# Patient Record
Sex: Female | Born: 1960
Health system: Southern US, Community
[De-identification: ages and names within clinical notes are randomized; demographics above are authoritative.]

## PROBLEM LIST (undated history)

## (undated) DIAGNOSIS — F32A Depression, unspecified: Secondary | ICD-10-CM

## (undated) DIAGNOSIS — D649 Anemia, unspecified: Secondary | ICD-10-CM

## (undated) DIAGNOSIS — E079 Disorder of thyroid, unspecified: Secondary | ICD-10-CM

## (undated) DIAGNOSIS — E039 Hypothyroidism, unspecified: Secondary | ICD-10-CM

## (undated) DIAGNOSIS — F419 Anxiety disorder, unspecified: Secondary | ICD-10-CM

## (undated) DIAGNOSIS — F329 Major depressive disorder, single episode, unspecified: Secondary | ICD-10-CM

## (undated) DIAGNOSIS — Z460 Encounter for fitting and adjustment of spectacles and contact lenses: Secondary | ICD-10-CM

## (undated) HISTORY — PX: BREAST EXCISIONAL BIOPSY: SUR124

## (undated) HISTORY — DX: Disorder of thyroid, unspecified: E07.9

---

## 1990-03-25 HISTORY — PX: BREAST SURGERY: SHX581

## 1997-11-16 ENCOUNTER — Other Ambulatory Visit: Admission: RE | Admit: 1997-11-16 | Discharge: 1997-11-16 | Payer: Self-pay | Admitting: Obstetrics and Gynecology

## 1998-05-29 ENCOUNTER — Ambulatory Visit (HOSPITAL_COMMUNITY): Admission: RE | Admit: 1998-05-29 | Discharge: 1998-05-29 | Payer: Self-pay | Admitting: *Deleted

## 1998-05-29 ENCOUNTER — Encounter: Payer: Self-pay | Admitting: *Deleted

## 1999-07-27 ENCOUNTER — Other Ambulatory Visit: Admission: RE | Admit: 1999-07-27 | Discharge: 1999-07-27 | Payer: Self-pay | Admitting: *Deleted

## 1999-09-07 ENCOUNTER — Encounter: Payer: Self-pay | Admitting: *Deleted

## 1999-09-07 ENCOUNTER — Encounter: Admission: RE | Admit: 1999-09-07 | Discharge: 1999-09-07 | Payer: Self-pay | Admitting: *Deleted

## 2000-10-20 ENCOUNTER — Encounter: Payer: Self-pay | Admitting: *Deleted

## 2000-10-20 ENCOUNTER — Ambulatory Visit (HOSPITAL_COMMUNITY): Admission: RE | Admit: 2000-10-20 | Discharge: 2000-10-20 | Payer: Self-pay | Admitting: *Deleted

## 2000-10-27 ENCOUNTER — Other Ambulatory Visit: Admission: RE | Admit: 2000-10-27 | Discharge: 2000-10-27 | Payer: Self-pay | Admitting: *Deleted

## 2001-12-14 ENCOUNTER — Other Ambulatory Visit: Admission: RE | Admit: 2001-12-14 | Discharge: 2001-12-14 | Payer: Self-pay | Admitting: Obstetrics and Gynecology

## 2002-05-24 ENCOUNTER — Ambulatory Visit (HOSPITAL_COMMUNITY): Admission: RE | Admit: 2002-05-24 | Discharge: 2002-05-24 | Payer: Self-pay | Admitting: Obstetrics and Gynecology

## 2002-05-24 ENCOUNTER — Encounter: Payer: Self-pay | Admitting: Obstetrics and Gynecology

## 2003-05-16 ENCOUNTER — Other Ambulatory Visit: Admission: RE | Admit: 2003-05-16 | Discharge: 2003-05-16 | Payer: Self-pay | Admitting: Obstetrics and Gynecology

## 2003-08-08 ENCOUNTER — Ambulatory Visit (HOSPITAL_COMMUNITY): Admission: RE | Admit: 2003-08-08 | Discharge: 2003-08-08 | Payer: Self-pay | Admitting: Obstetrics and Gynecology

## 2003-09-17 ENCOUNTER — Ambulatory Visit: Admission: RE | Admit: 2003-09-17 | Discharge: 2003-09-17 | Payer: Self-pay | Admitting: Internal Medicine

## 2004-06-22 ENCOUNTER — Other Ambulatory Visit: Admission: RE | Admit: 2004-06-22 | Discharge: 2004-06-22 | Payer: Self-pay | Admitting: Obstetrics and Gynecology

## 2005-03-22 ENCOUNTER — Ambulatory Visit (HOSPITAL_COMMUNITY): Admission: RE | Admit: 2005-03-22 | Discharge: 2005-03-22 | Payer: Self-pay | Admitting: Obstetrics and Gynecology

## 2005-11-08 ENCOUNTER — Encounter: Admission: RE | Admit: 2005-11-08 | Discharge: 2005-11-08 | Payer: Self-pay | Admitting: Internal Medicine

## 2005-11-29 ENCOUNTER — Encounter: Admission: RE | Admit: 2005-11-29 | Discharge: 2005-11-29 | Payer: Self-pay | Admitting: Internal Medicine

## 2006-01-10 ENCOUNTER — Encounter: Admission: RE | Admit: 2006-01-10 | Discharge: 2006-01-10 | Payer: Self-pay | Admitting: Internal Medicine

## 2006-07-04 ENCOUNTER — Ambulatory Visit (HOSPITAL_COMMUNITY): Admission: RE | Admit: 2006-07-04 | Discharge: 2006-07-04 | Payer: Self-pay | Admitting: Obstetrics and Gynecology

## 2006-07-04 IMAGING — MG MM DIGITAL SCREENING BILAT
6 series · 6 of 6 positions shown · non-contrast
Comparison: none

DG SCREEN MAMMOGRAM BILATERAL
Bilateral CC and MLO view(s) were taken.

DIGITAL SCREENING MAMMOGRAM WITH CAD:
There is a  dense fibroglandular pattern.  No masses or malignant type calcifications are 
identified.  Compared with prior studies.

[R CC]
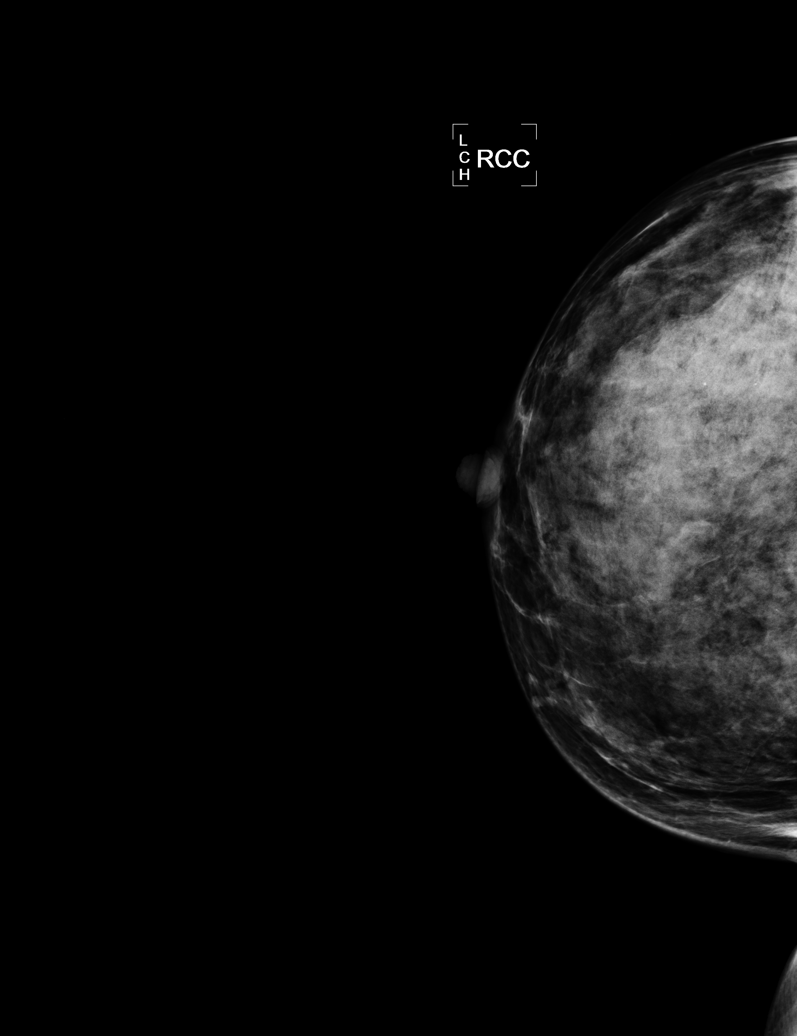

[R MLO (1 of 2)]
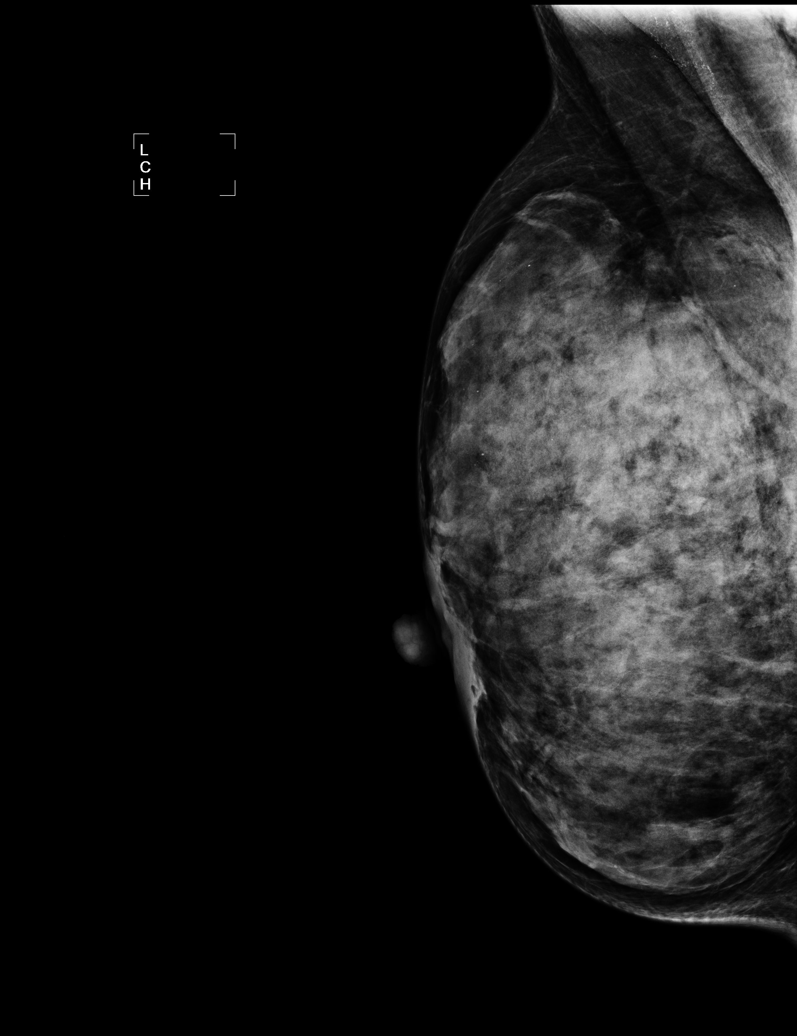

[L CC (1 of 2)]
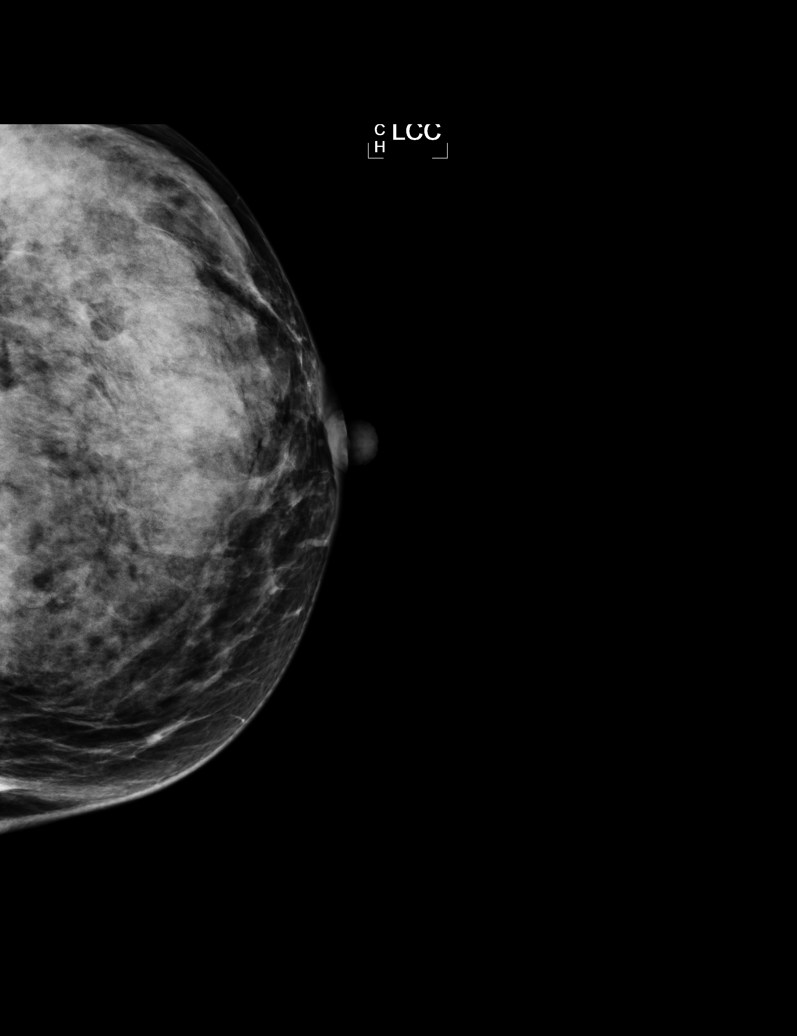

[L MLO]
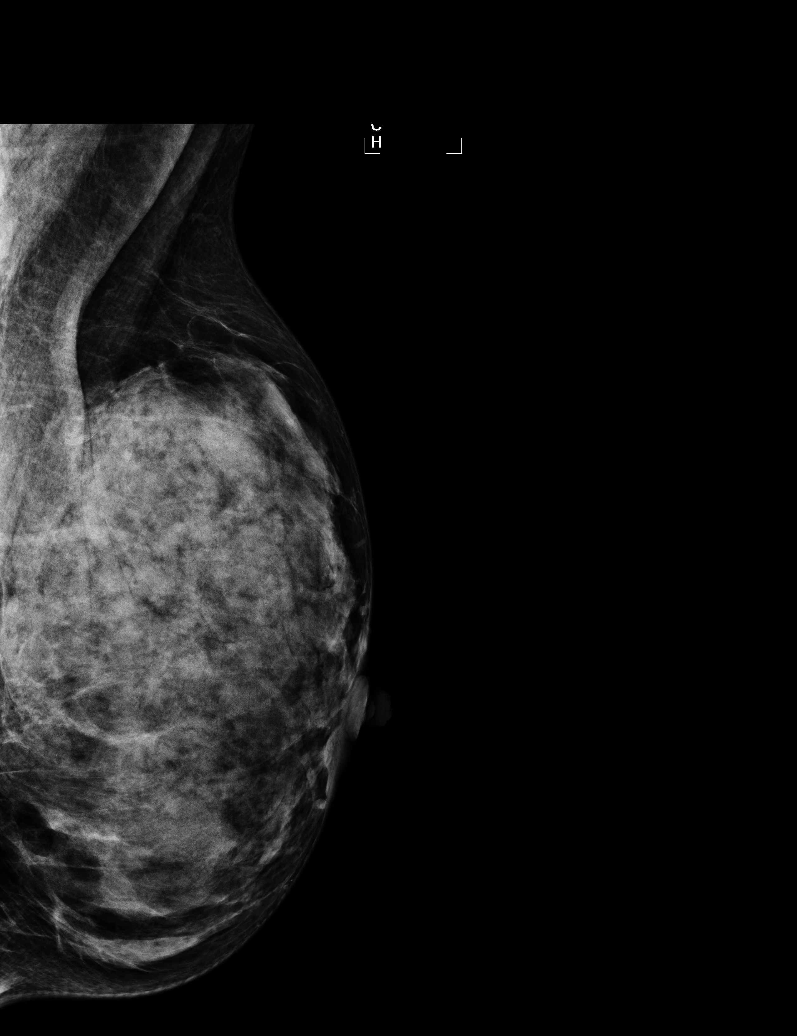

[R MLO (2 of 2)]
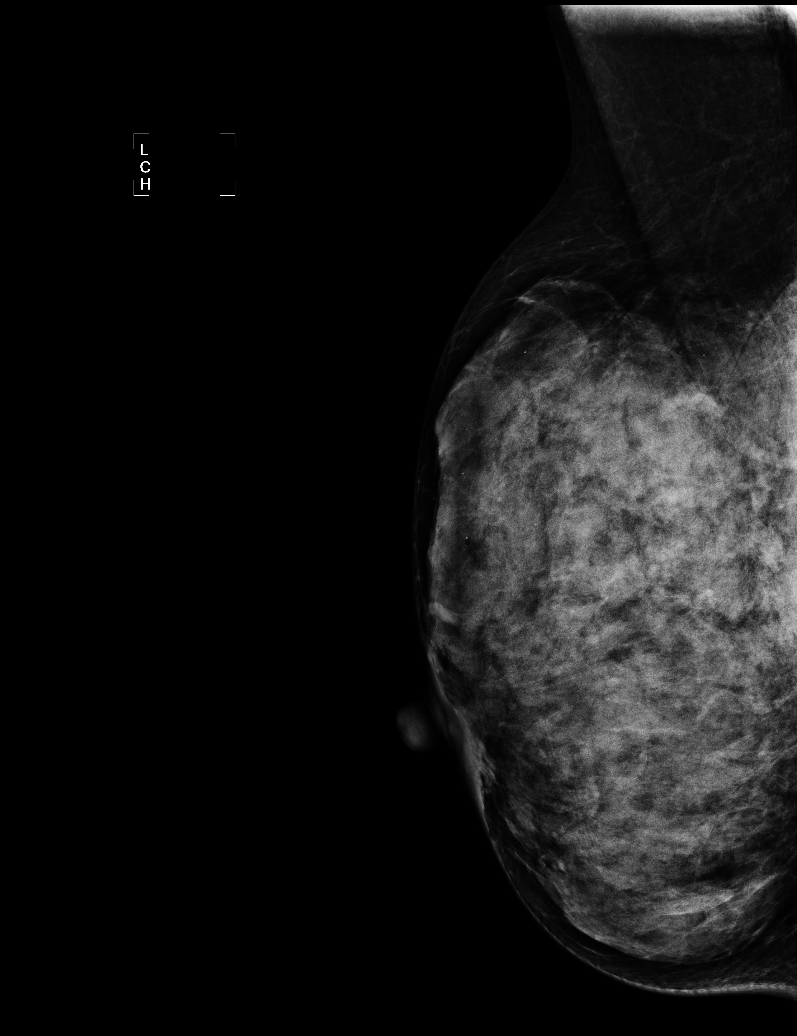

[L CC (2 of 2)]
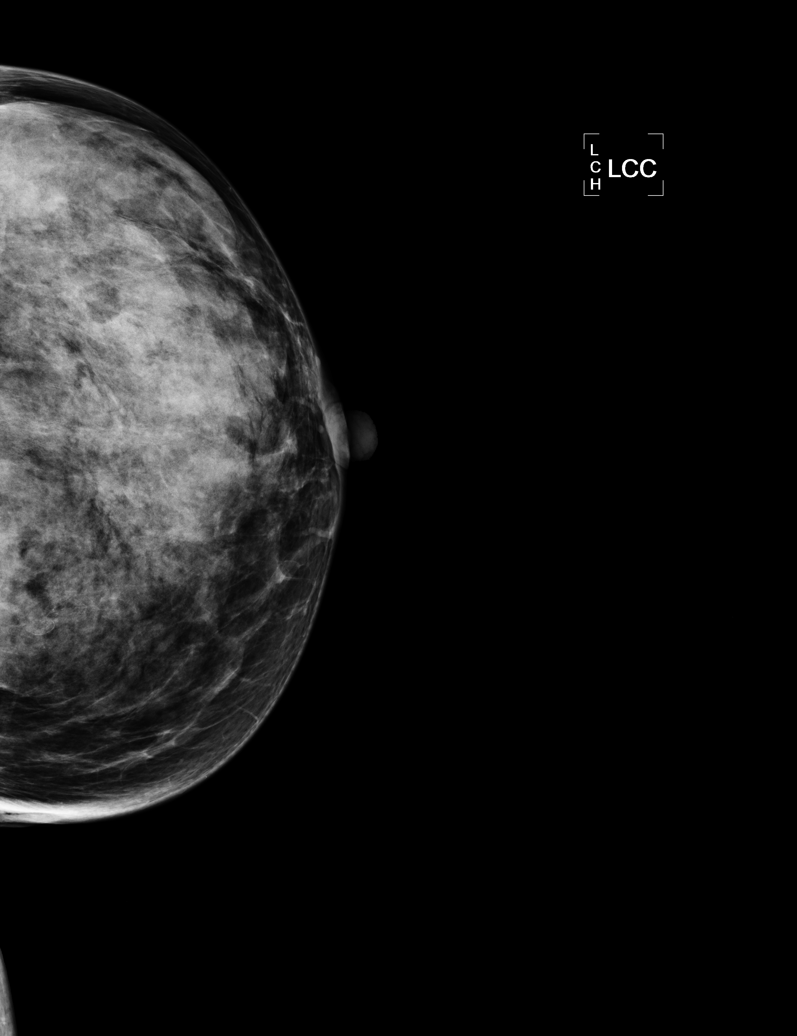

[6 of 6 positions shown; findings below may reference images not displayed]

IMPRESSION: No specific mammographic evidence of malignancy.  Next screening mammogram is recommended in one 
year.

ASSESSMENT: Negative - BI-RADS 1

Screening mammogram in 1 year.
ANALYZED BY COMPUTER AIDED DETECTION. , THIS PROCEDURE WAS A DIGITAL MAMMOGRAM.

## 2006-07-16 ENCOUNTER — Encounter: Admission: RE | Admit: 2006-07-16 | Discharge: 2006-07-16 | Payer: Self-pay | Admitting: Internal Medicine

## 2006-09-05 ENCOUNTER — Encounter: Admission: RE | Admit: 2006-09-05 | Discharge: 2006-09-05 | Payer: Self-pay | Admitting: Internal Medicine

## 2007-03-09 ENCOUNTER — Encounter: Admission: RE | Admit: 2007-03-09 | Discharge: 2007-03-09 | Payer: Self-pay | Admitting: Neurosurgery

## 2007-03-09 IMAGING — CT CT L SPINE W/ CM
4 of 7 series · 15 of 33 positions shown, 17 images · IV contrast (agent unspecified)
Comparison: none

CLINICAL DATA: Low back and right lower extremity pain. No previous lumbar
surgery.

LUMBAR DISCOGRAM 3 LEVEL: 
CT LUMBAR SPINE WITH INTRADISCAL CONTRAST:
CT MULTIPLANAR RECONSTRUCTION:
TECHNIQUE: Lumbar region prepped with Betadine, draped in usual sterile fashion,
and infiltrated locally with 1% lidocaine. Intravenous   Versed   administered
as anxiolytic   during continuous cardiorespiratory monitoring by the radiology
RN. Curved 22 gauge CHENG LEONG advanced into the L3-L4, L4-L5, and L5-S1
interspaces using a left  posterolateral approach. The levels were sequentially
injected with water-soluble contrast during continuous fluoroscopic and pressure
monitoring. The needles were removed and the patient transferred to CT for
helical scanning through these levels. Coronal and sagittal reconstructions were
generated from the axial scan.

[Series 2: l spine · axial · 0.27mm/px · z∈[-208,-120]mm · 4 of 59 slices shown, 5 images]
[im 12/59  soft-tissue]
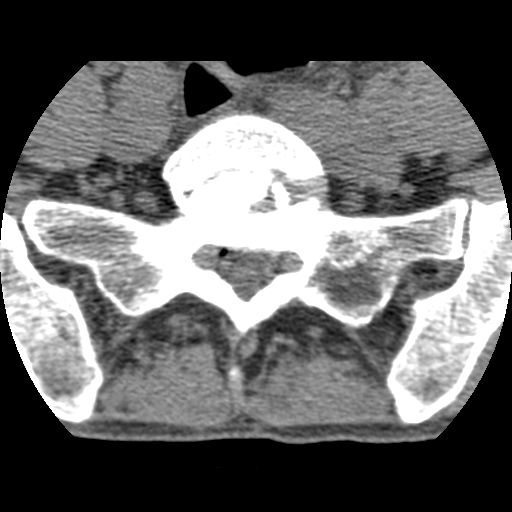
[im 12/59  bone]
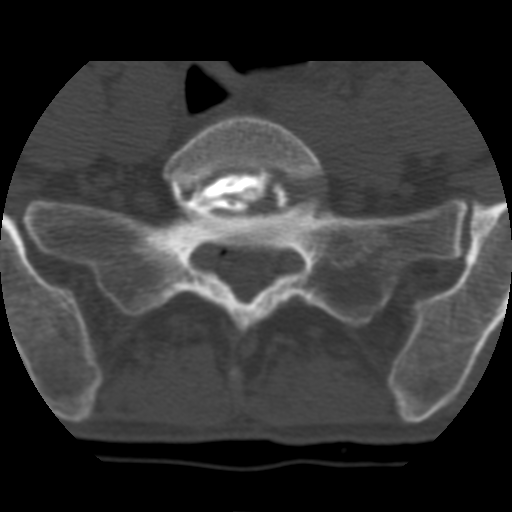
[im 24/59  bone]
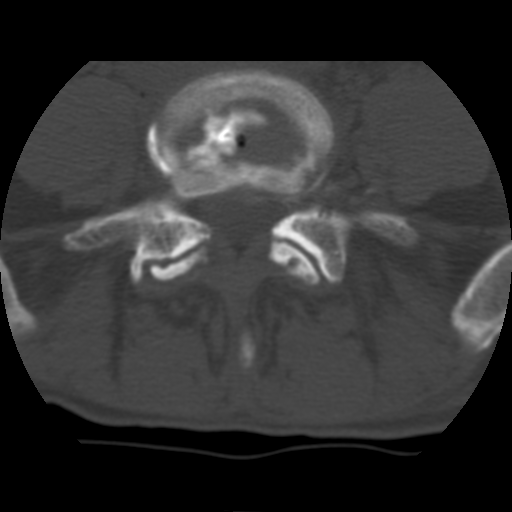
[im 35/59  bone]
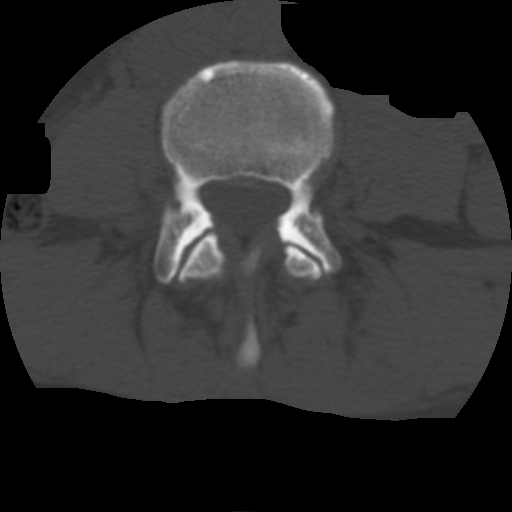
[im 47/59  bone]
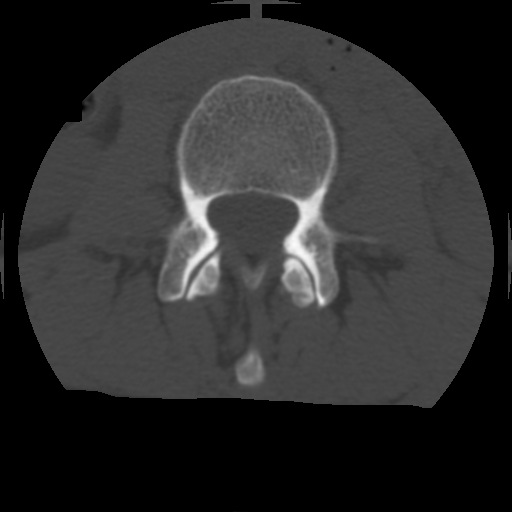

[Series 3: bone windows · axial · 0.27mm/px · z∈[-198,-128]mm · 3 of 58 slices shown]
[im 15/58  bone]
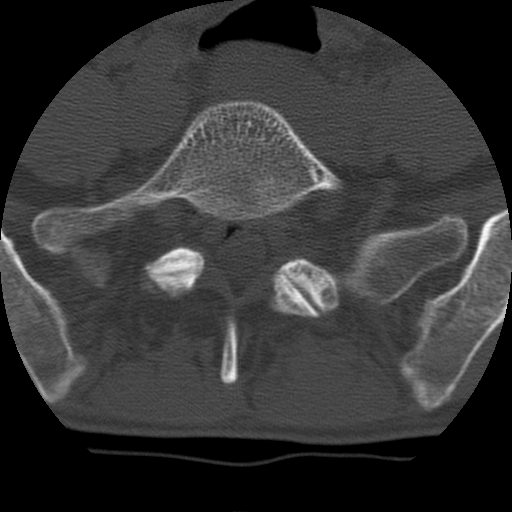
[im 29/58  bone]
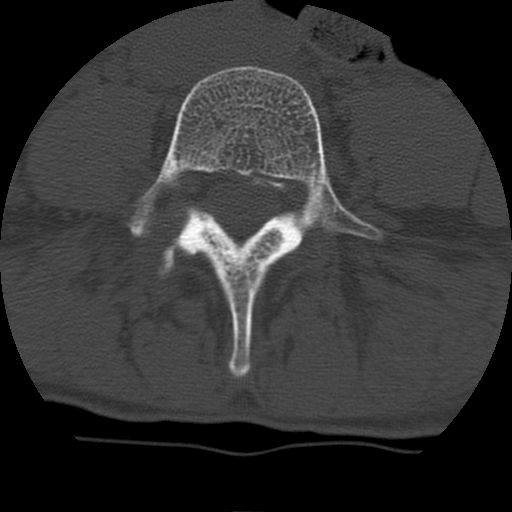
[im 43/58  bone]
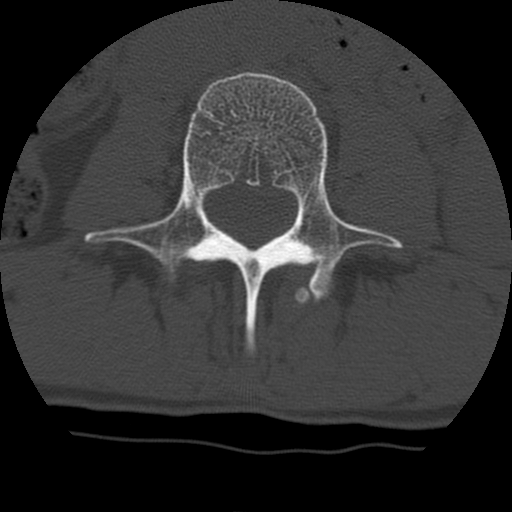

[Series 400: cor l-spine · coronal · 0.29mm/px · 3 of 40 slices shown]
[im 8/40  bone]
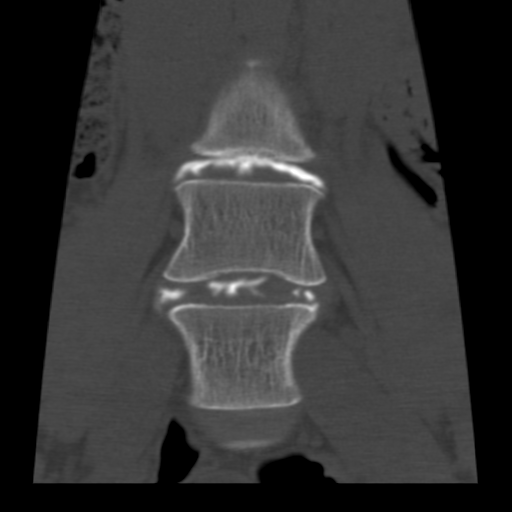
[im 16/40  bone]
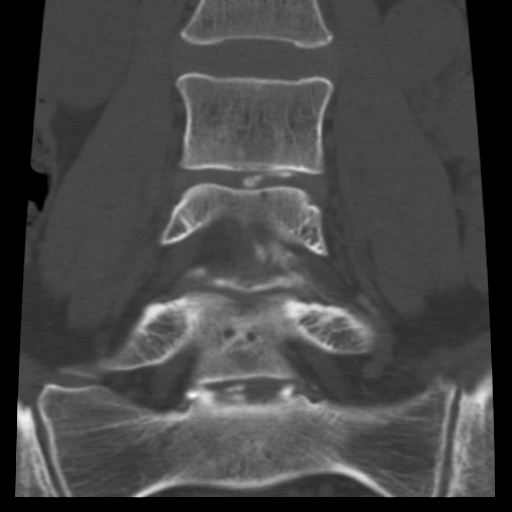
[im 24/40  bone]
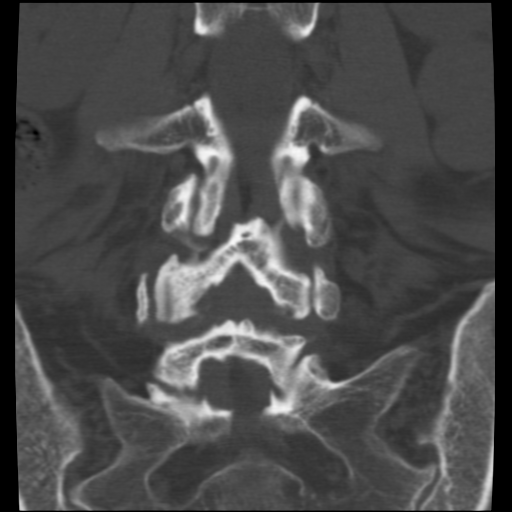

[Series 401: sag l-spine · sagittal · 0.29mm/px · 5 of 40 slices shown, 6 images]
[im 14/40  bone]
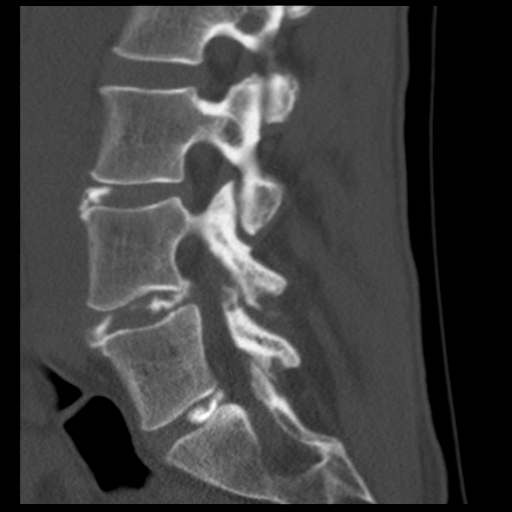
[im 17/40  bone]
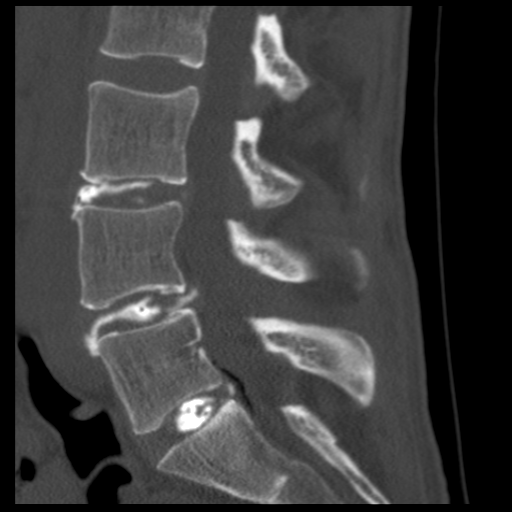
[im 20/40  soft-tissue]
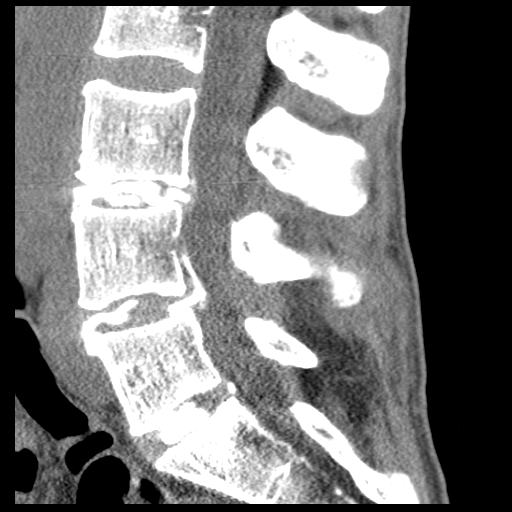
[im 20/40  bone]
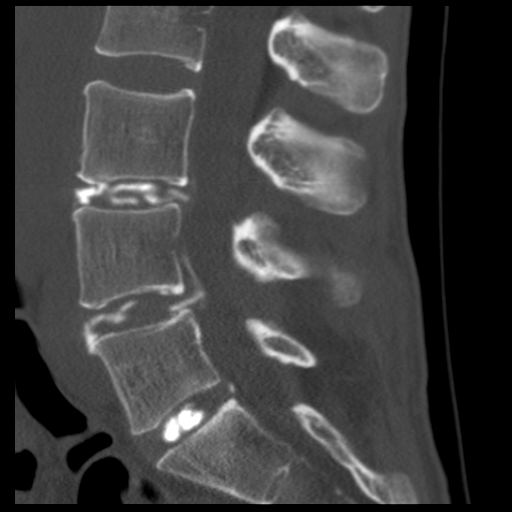
[im 23/40  bone]
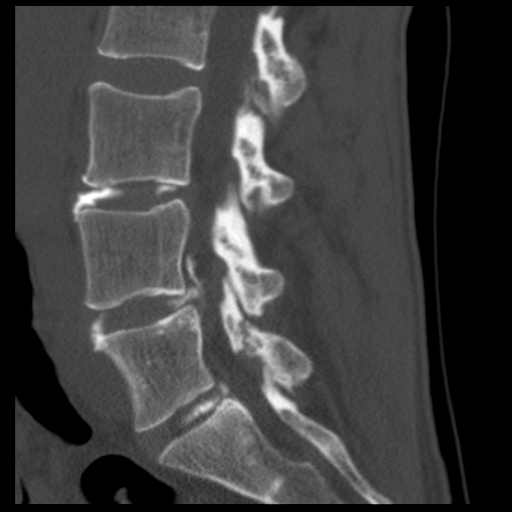
[im 27/40  bone]
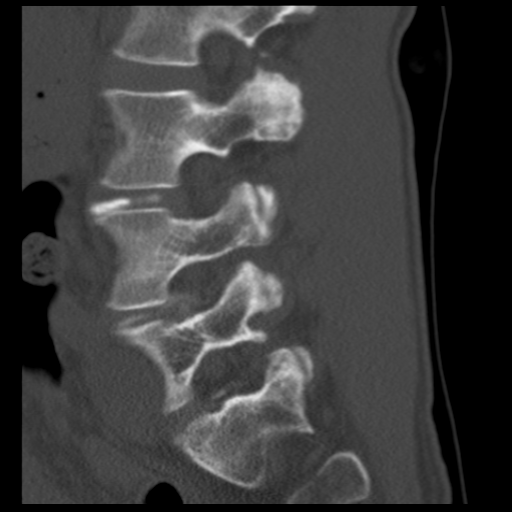

[15 of 33 positions shown; findings below may reference images not displayed]

FINDINGS: L3-L4: Opening pressures 0 PSI, rising to only 5 PSI after 2 ml contrast.
Patient described transient pain across the lumbar spine, concordant to a
portion of her usual pain, although this pain subsided relatively quickly and
she was not able to assign a value on the visual analogue pain scale.
Discography showed early anterior and posterior annular defects with posterior
fairly extensive extra annular extension of contrast. CT discography shows a
posterior midline annular radial tear with extension of contrast into an
associated bulge. There is also fairly extensive anterior annular degeneration.

L4-L5: Opening pressure 10 PSI, without significant increase in  pressure after
1 mL of contrast, at which point patient describes left lumbar pain, more
cephalad than her usual symptoms, rated [DATE] on the pain scale.
Discography showed posterior annular defect and anterior annular defects with
posterior extra annular extension of contrast. CT discography shows a broad
right posterolateral annular defect with contrast extending into a moderate
diffuse posterior disc protrusion with right and left foraminal components.
There is also fairly advanced anterior annular degeneration with some extension
of the contrast through the annulus extending anterior to the anterior plate of
L5.

L5-S1: Opening pressure 25 PSI, rising to 90 PSI after 3 mL contrast with no
pain response. Discography showed a small extra annular extension of contrast
although most of the contrast remained centrally in the nucleus. CT discography
demonstrates a right extraforaminal annular defect with mild circumferential
extra annular extension of contrast associated with a mild disc bulge. There is
a small amount of gas in the right parasagittal epidural space at the level of
interspace which may represent gas expelled from the interspace during contrast
injection.
IMPRESSION: 1. Abnormal discography L3-L4 with concordant pain response.
2. Abnormal discography L4-L5 with discordant pain response.
3. Abnormal discography L5-S1 with no significant pain response.

## 2007-03-09 IMAGING — RF DG DISKOGRAPHY LUMBAR S+I
8 series · 8 of 8 positions shown · IV contrast (agent unspecified)
Comparison: none

CLINICAL DATA: Low back and right lower extremity pain. No previous lumbar
surgery.

LUMBAR DISCOGRAM 3 LEVEL: 
CT LUMBAR SPINE WITH INTRADISCAL CONTRAST:
CT MULTIPLANAR RECONSTRUCTION:
TECHNIQUE: Lumbar region prepped with Betadine, draped in usual sterile fashion,
and infiltrated locally with 1% lidocaine. Intravenous   Versed   administered
as anxiolytic   during continuous cardiorespiratory monitoring by the radiology
RN. Curved 22 gauge CHENG LEONG advanced into the L3-L4, L4-L5, and L5-S1
interspaces using a left  posterolateral approach. The levels were sequentially
injected with water-soluble contrast during continuous fluoroscopic and pressure
monitoring. The needles were removed and the patient transferred to CT for
helical scanning through these levels. Coronal and sagittal reconstructions were
generated from the axial scan.

[Series 1: (hospital) · 1 of 1 slices shown]
[im 1/1]
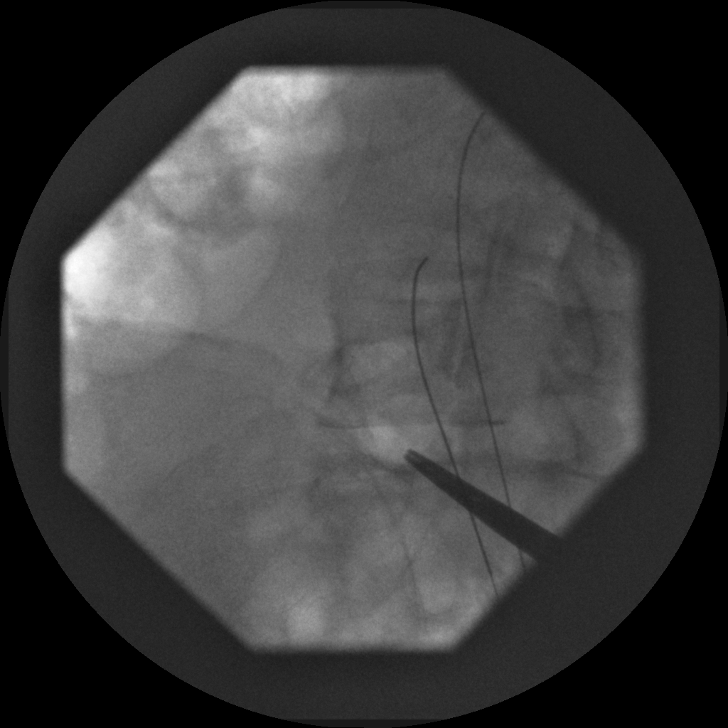

[Series 2: discogram · 1 of 1 slices shown (1 of 7)]
[im 1/1]
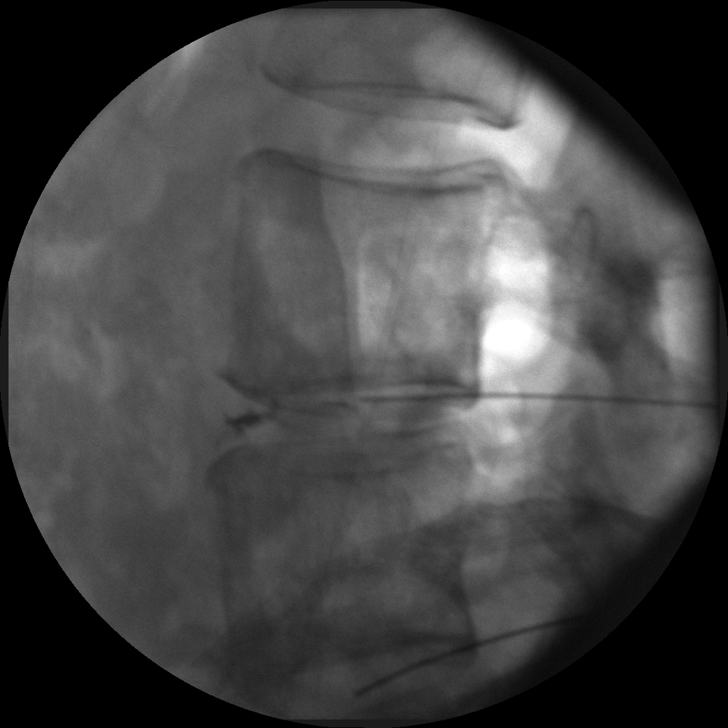

[Series 3: discogram · 1 of 1 slices shown (2 of 7)]
[im 1/1]
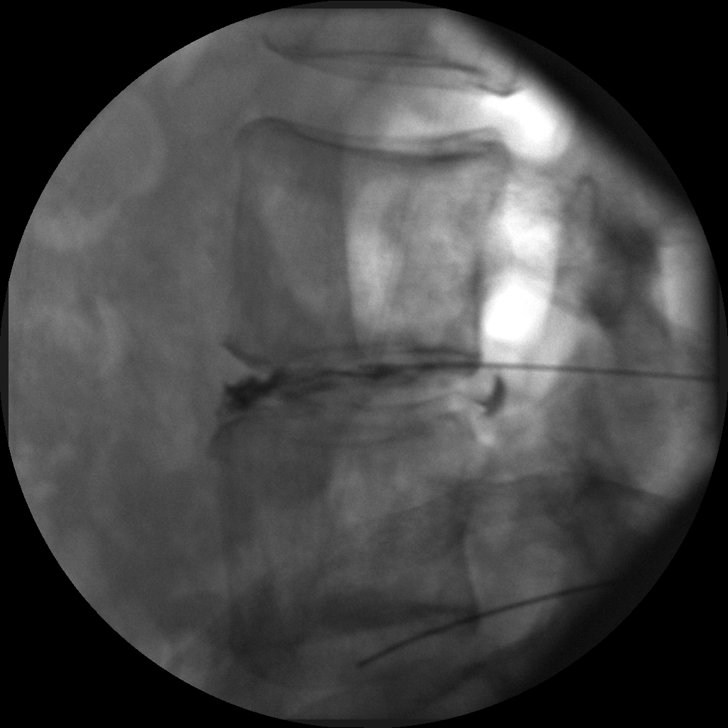

[Series 4: discogram · 1 of 1 slices shown (3 of 7)]
[im 1/1]
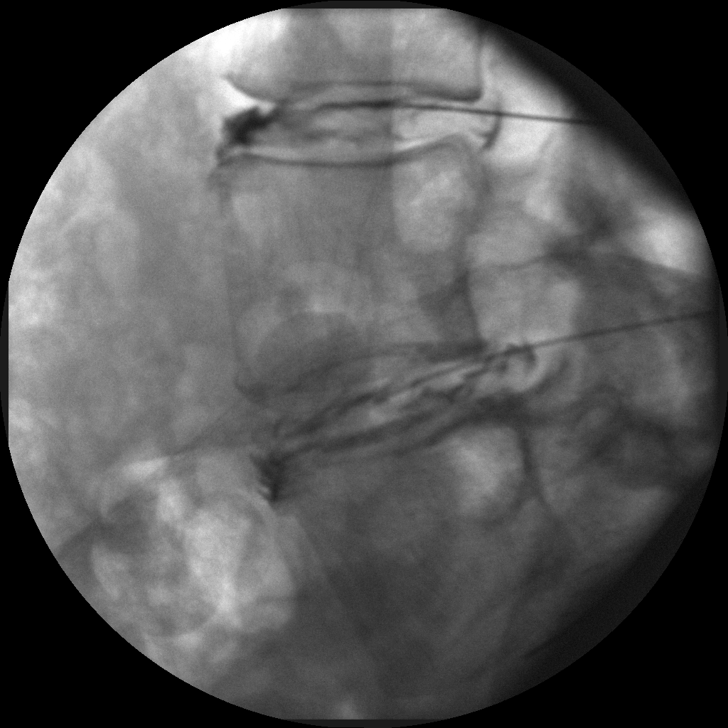

[Series 5: discogram · 1 of 1 slices shown (4 of 7)]
[im 1/1]
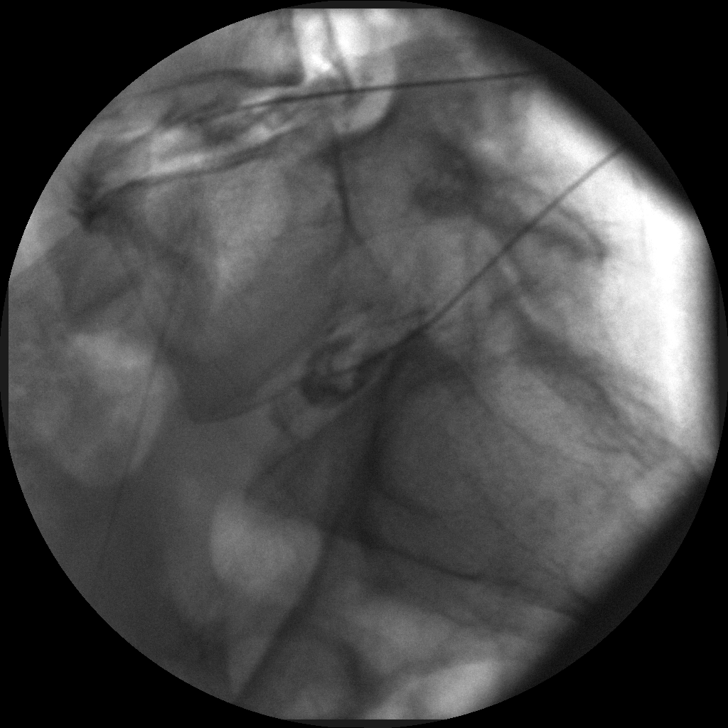

[Series 6: discogram · 1 of 1 slices shown (5 of 7)]
[im 1/1]
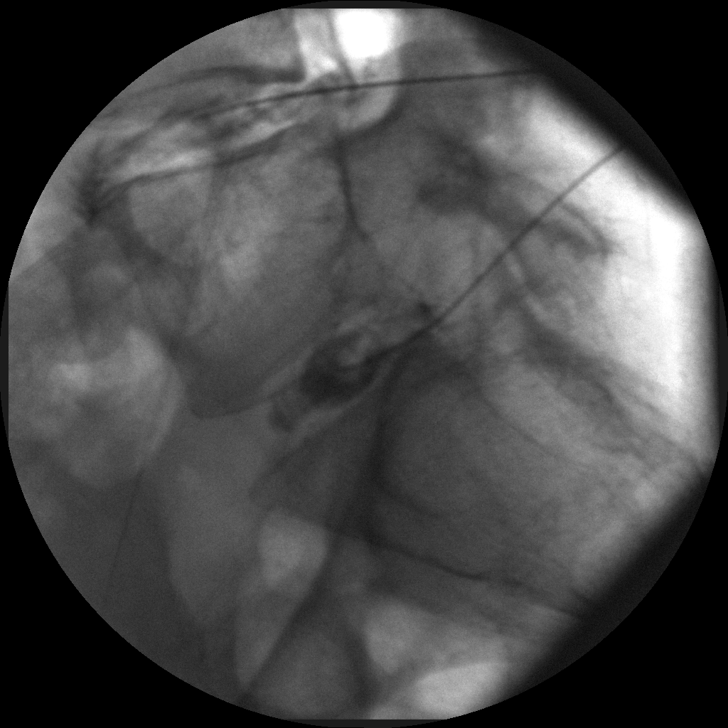

[Series 7: discogram · 1 of 1 slices shown (6 of 7)]
[im 1/1]
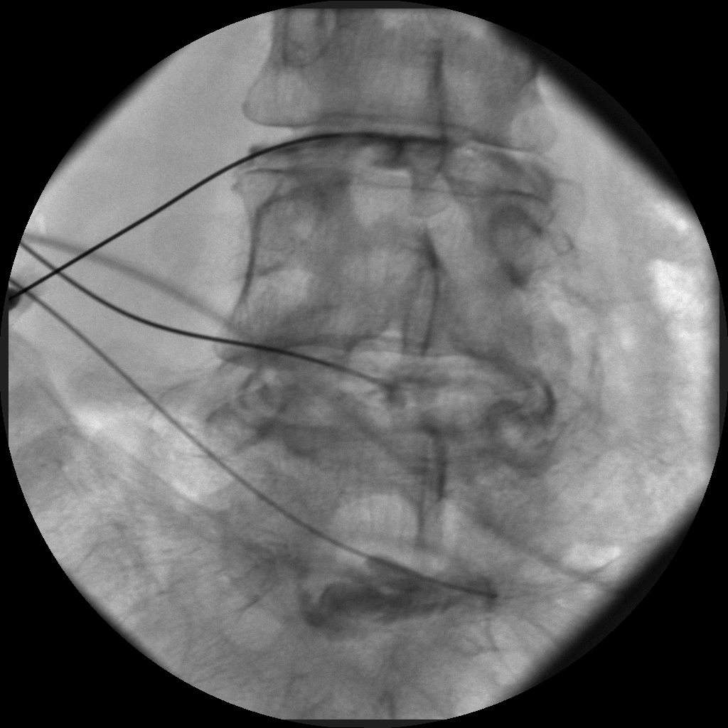

[Series 8: discogram · 1 of 1 slices shown (7 of 7)]
[im 1/1]
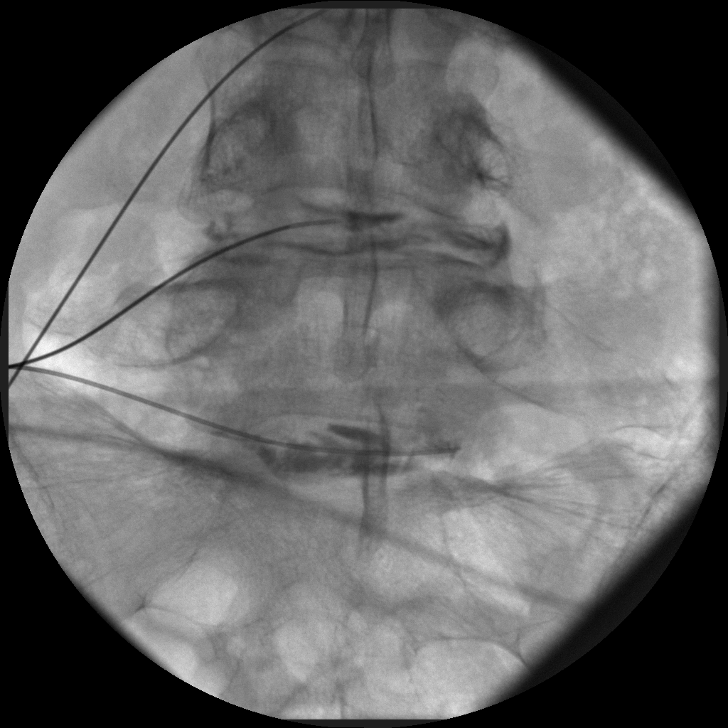

[8 of 8 positions shown; findings below may reference images not displayed]

FINDINGS: L3-L4: Opening pressures 0 PSI, rising to only 5 PSI after 2 ml contrast.
Patient described transient pain across the lumbar spine, concordant to a
portion of her usual pain, although this pain subsided relatively quickly and
she was not able to assign a value on the visual analogue pain scale.
Discography showed early anterior and posterior annular defects with posterior
fairly extensive extra annular extension of contrast. CT discography shows a
posterior midline annular radial tear with extension of contrast into an
associated bulge. There is also fairly extensive anterior annular degeneration.

L4-L5: Opening pressure 10 PSI, without significant increase in  pressure after
1 mL of contrast, at which point patient describes left lumbar pain, more
cephalad than her usual symptoms, rated [DATE] on the pain scale.
Discography showed posterior annular defect and anterior annular defects with
posterior extra annular extension of contrast. CT discography shows a broad
right posterolateral annular defect with contrast extending into a moderate
diffuse posterior disc protrusion with right and left foraminal components.
There is also fairly advanced anterior annular degeneration with some extension
of the contrast through the annulus extending anterior to the anterior plate of
L5.

L5-S1: Opening pressure 25 PSI, rising to 90 PSI after 3 mL contrast with no
pain response. Discography showed a small extra annular extension of contrast
although most of the contrast remained centrally in the nucleus. CT discography
demonstrates a right extraforaminal annular defect with mild circumferential
extra annular extension of contrast associated with a mild disc bulge. There is
a small amount of gas in the right parasagittal epidural space at the level of
interspace which may represent gas expelled from the interspace during contrast
injection.
IMPRESSION: 1. Abnormal discography L3-L4 with concordant pain response.
2. Abnormal discography L4-L5 with discordant pain response.
3. Abnormal discography L5-S1 with no significant pain response.

## 2007-07-30 ENCOUNTER — Ambulatory Visit (HOSPITAL_COMMUNITY): Admission: RE | Admit: 2007-07-30 | Discharge: 2007-07-30 | Payer: Self-pay | Admitting: Obstetrics and Gynecology

## 2007-07-30 IMAGING — MG MM DIGITAL SCREENING BILAT
5 series · 5 of 5 positions shown · non-contrast
Comparison: none

DG SCREEN MAMMOGRAM BILATERAL
Bilateral CC and MLO view(s) were taken.

DIGITAL SCREENING MAMMOGRAM WITH CAD:
The breast tissue is extremely dense.  No masses or malignant type calcifications are identified.  
Compared with prior studies.

[R CC]
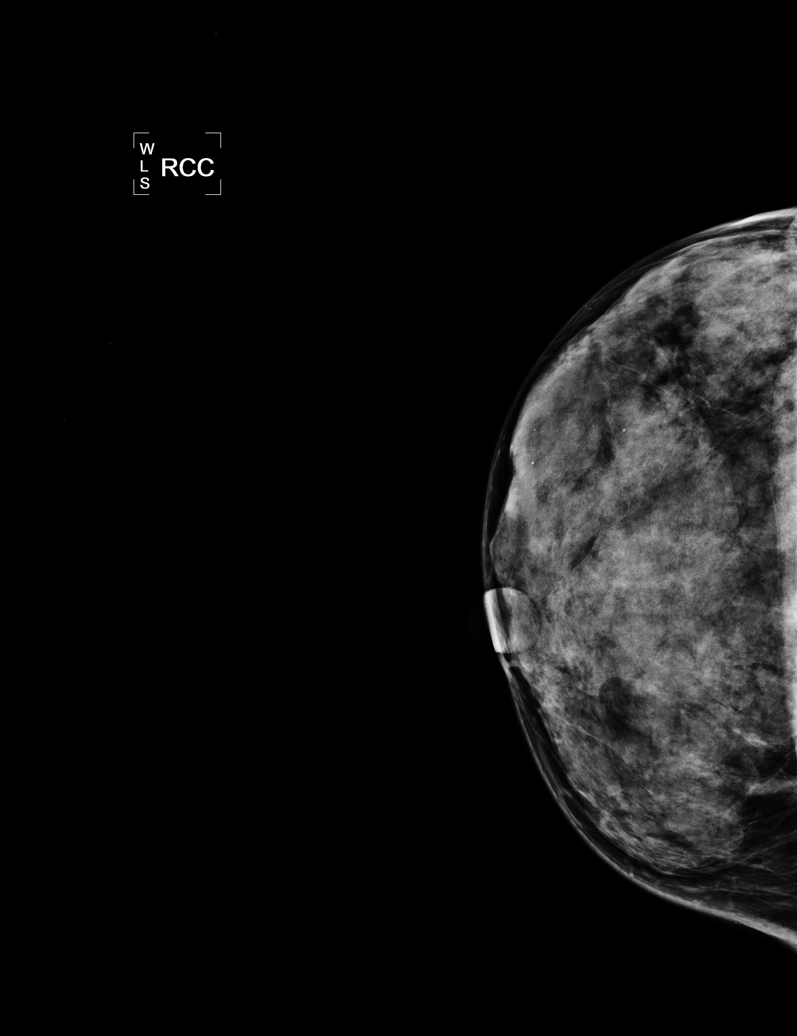

[R MLO]
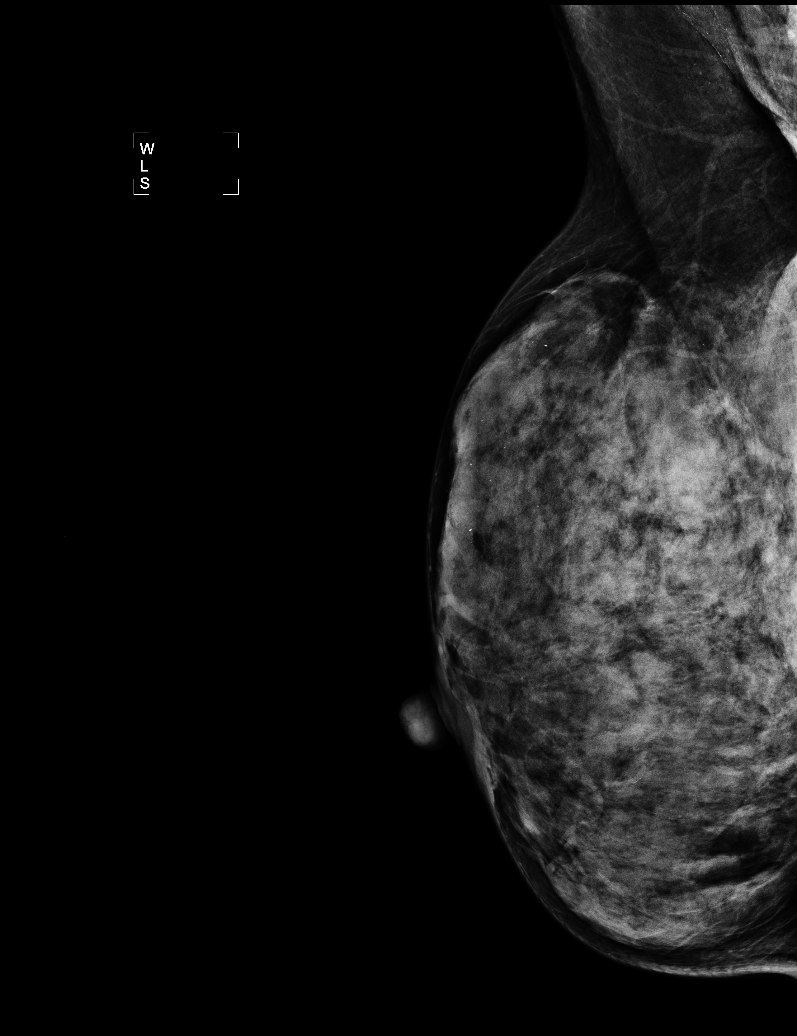

[L CC (1 of 2)]
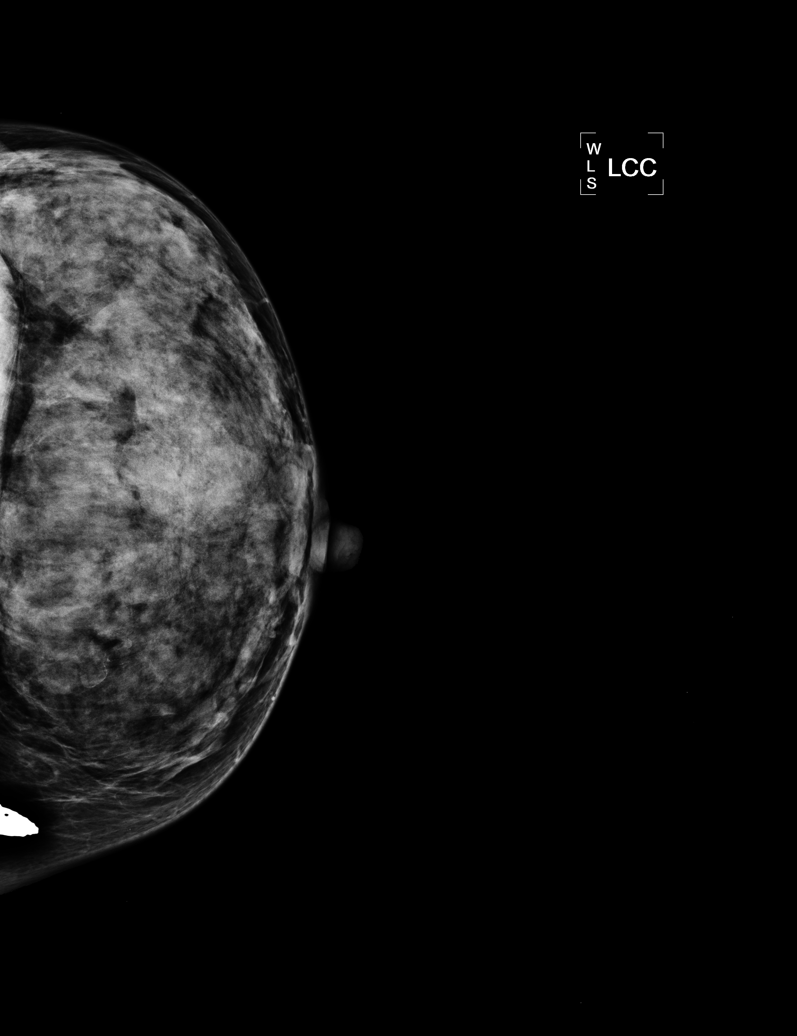

[L MLO]
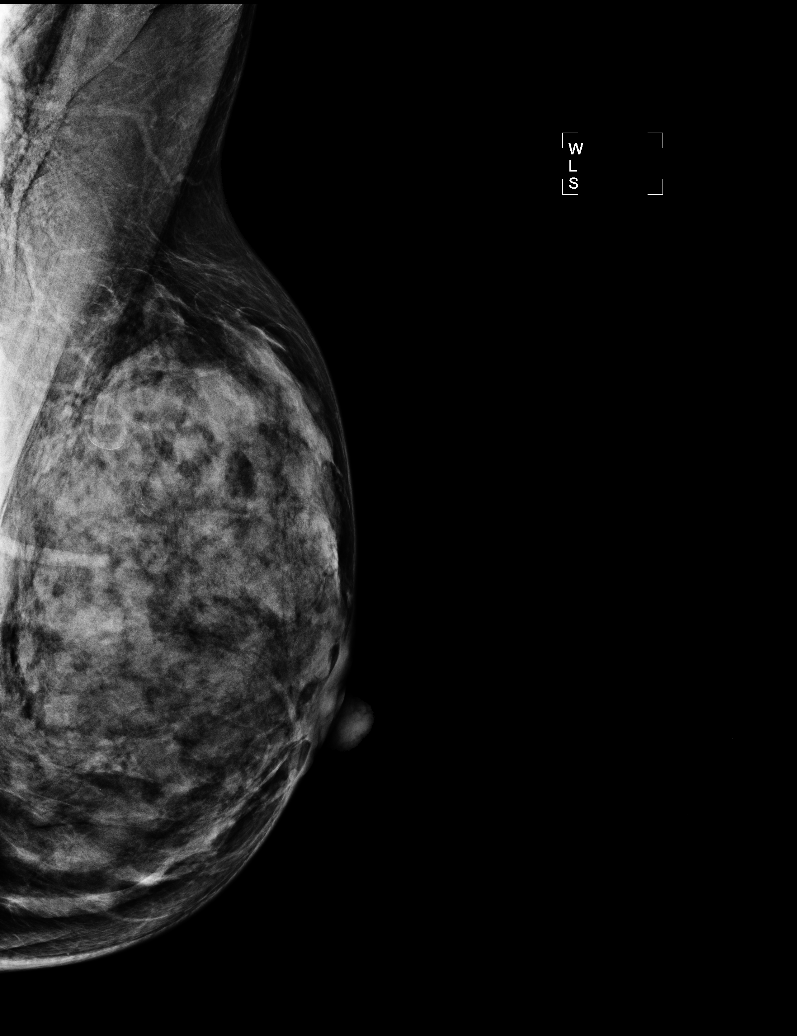

[L CC (2 of 2)]
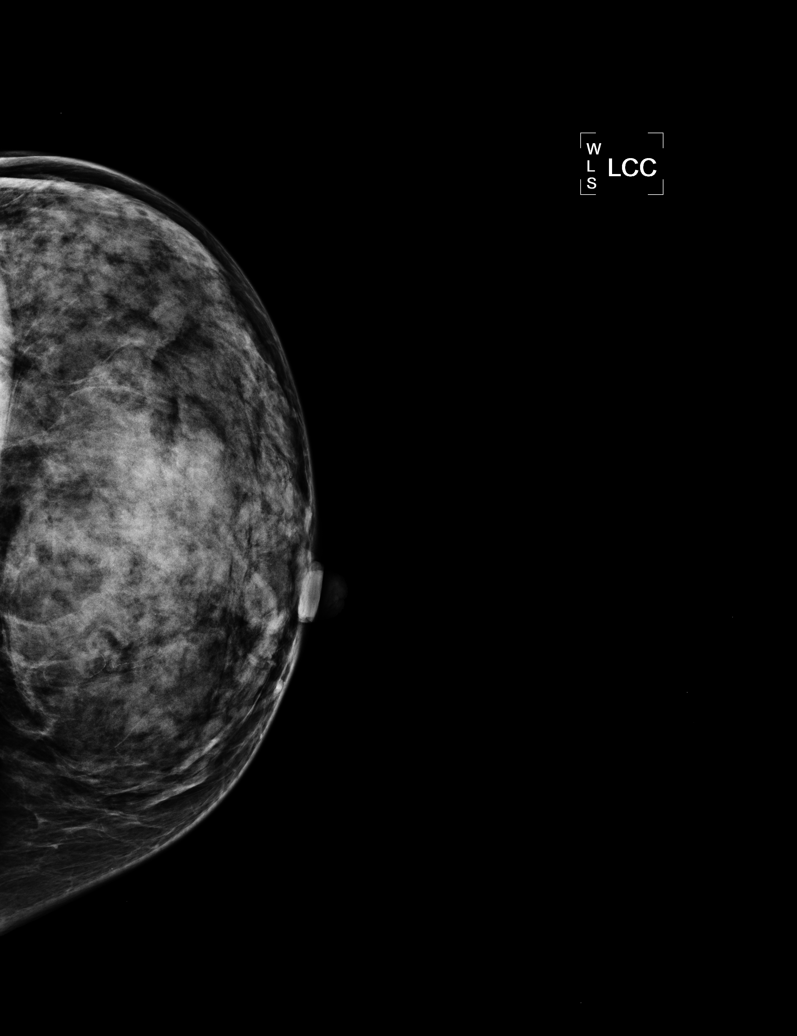

[5 of 5 positions shown; findings below may reference images not displayed]

IMPRESSION: No specific mammographic evidence of malignancy.  Next screening mammogram is recommended in one 
year.

ASSESSMENT: Negative - BI-RADS 1

Screening mammogram in 1 year.
ANALYZED BY COMPUTER AIDED DETECTION. , THIS PROCEDURE WAS A DIGITAL MAMMOGRAM.

## 2007-09-14 ENCOUNTER — Ambulatory Visit (HOSPITAL_COMMUNITY): Admission: RE | Admit: 2007-09-14 | Discharge: 2007-09-14 | Payer: Self-pay | Admitting: Chiropractic Medicine

## 2007-09-14 IMAGING — CR DG CERVICAL SPINE COMPLETE 4+V
5 series · 5 of 5 positions shown · non-contrast
Comparison: No priors

CLINICAL DATA: Cervical pain - no known injury

CERVICAL SPINE - COMPLETE 4+ VIEW

[w c-spine lat]
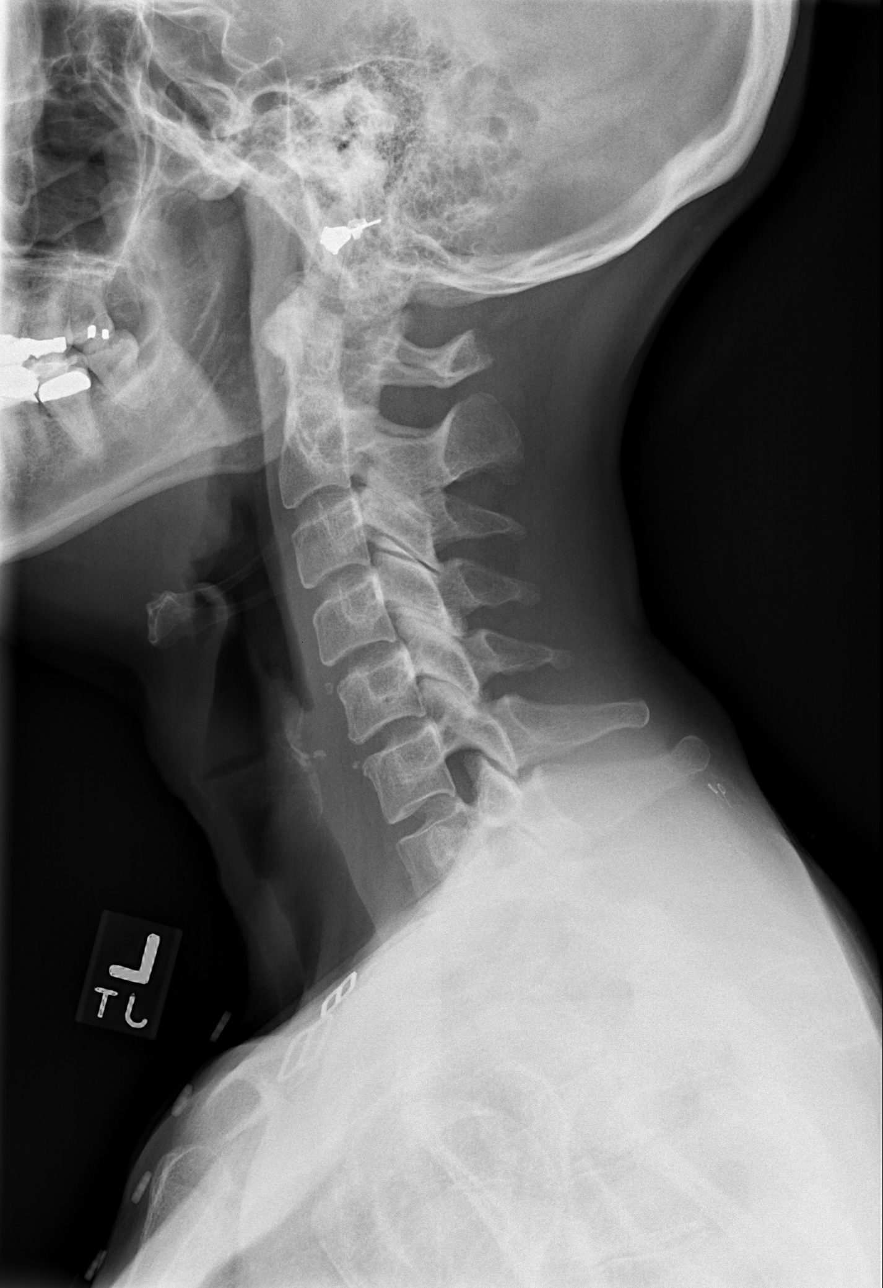

[w c-spine oblique (1 of 2)]
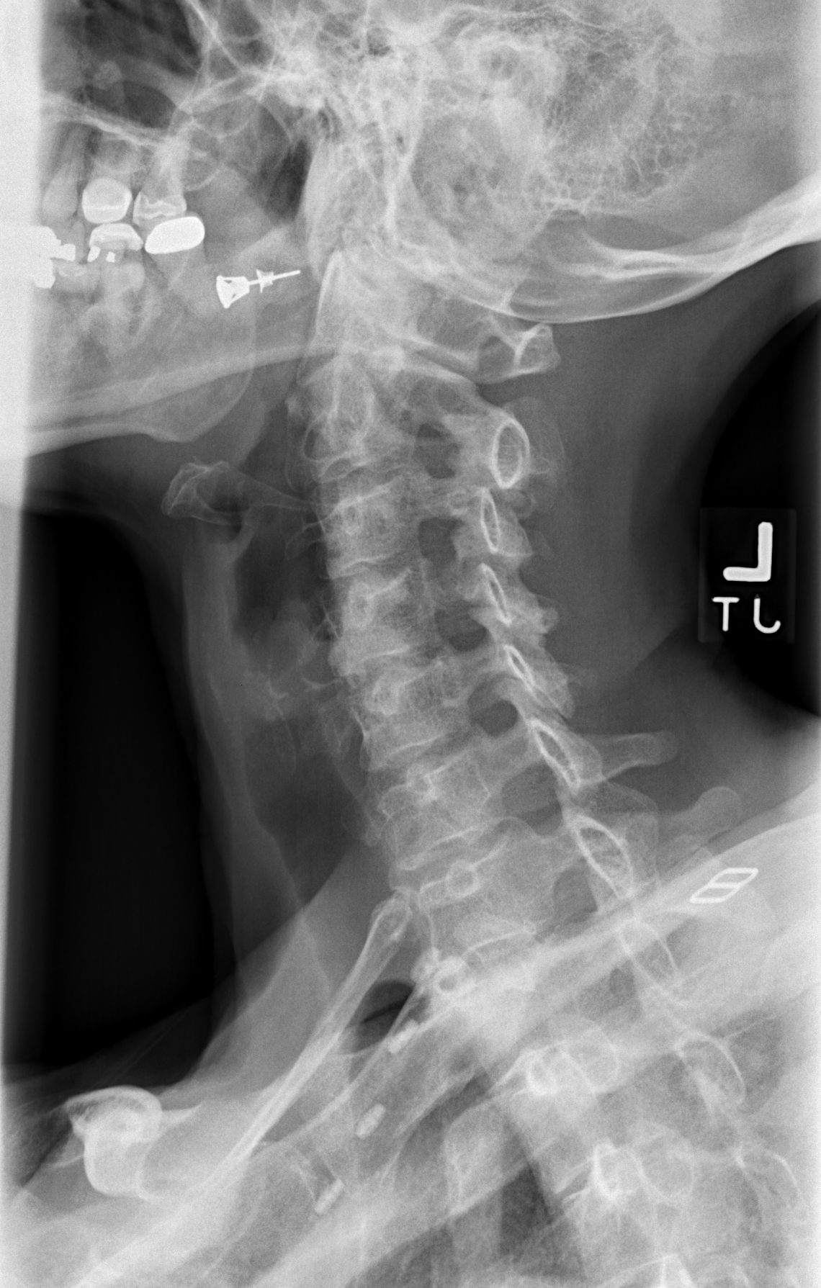

[w c-spine oblique (2 of 2)]
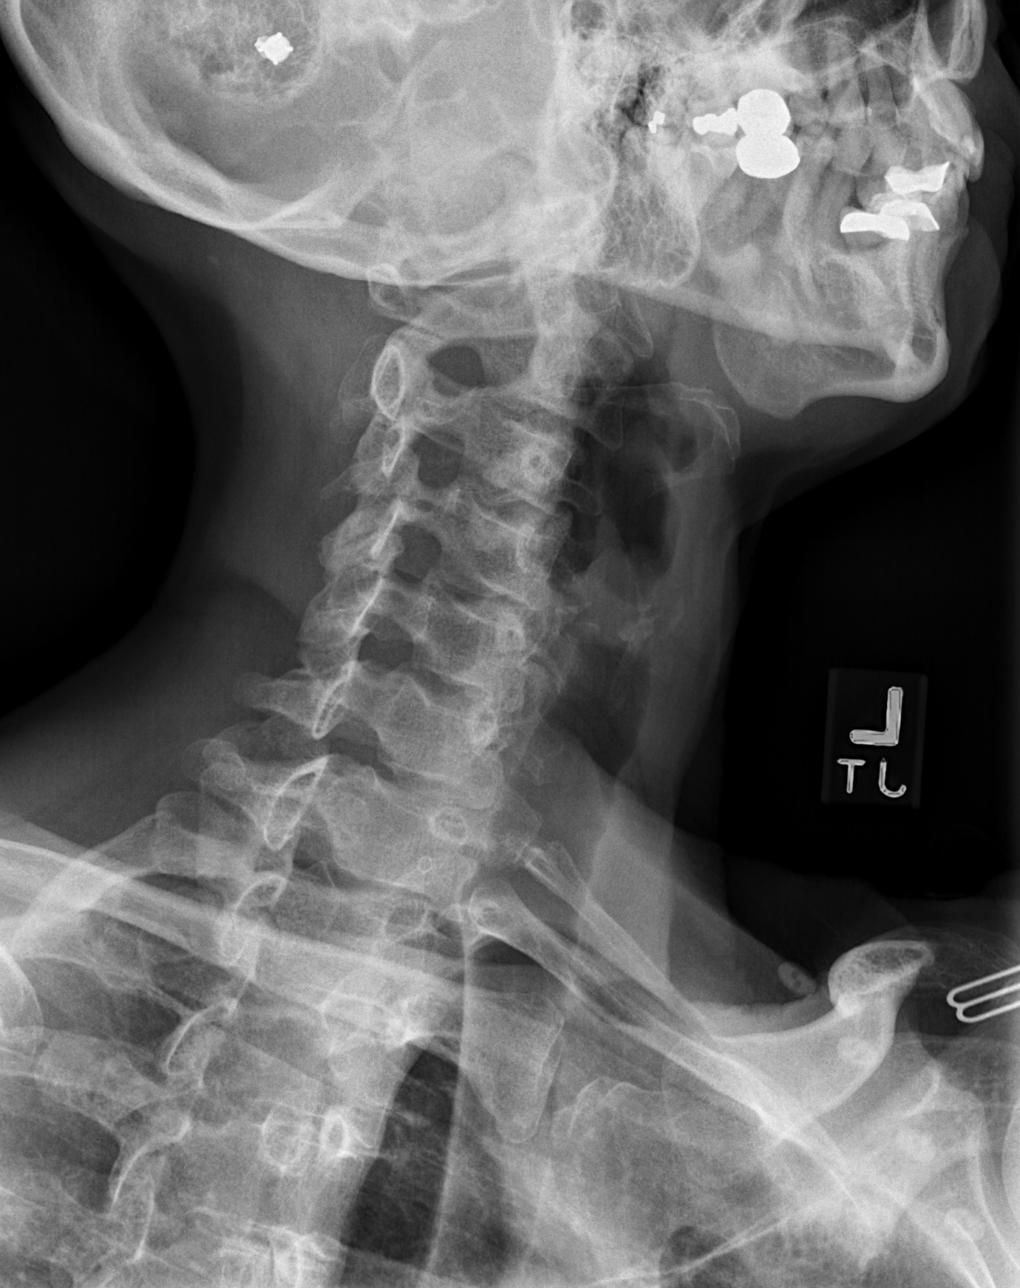

[t c-spine a.p.]
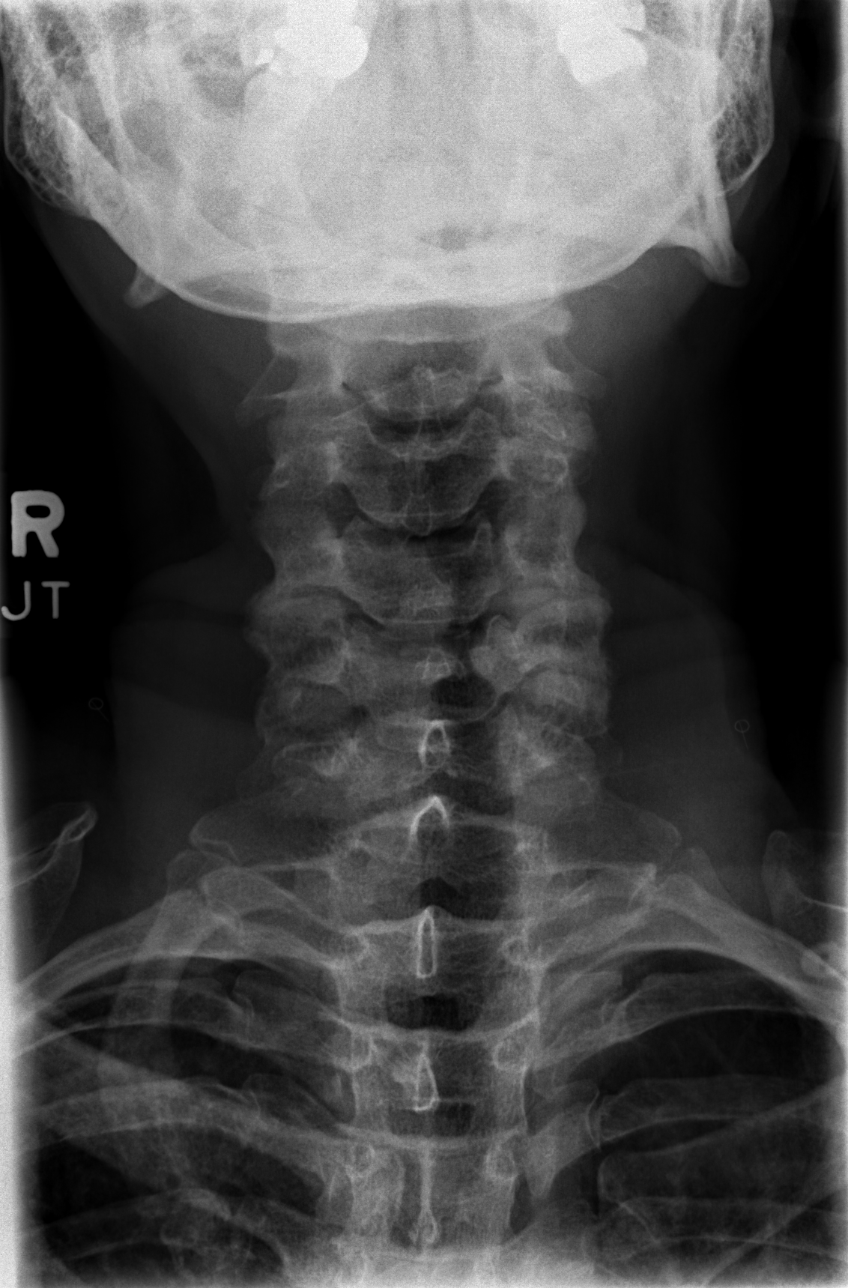

[t c-spine odontoid *]
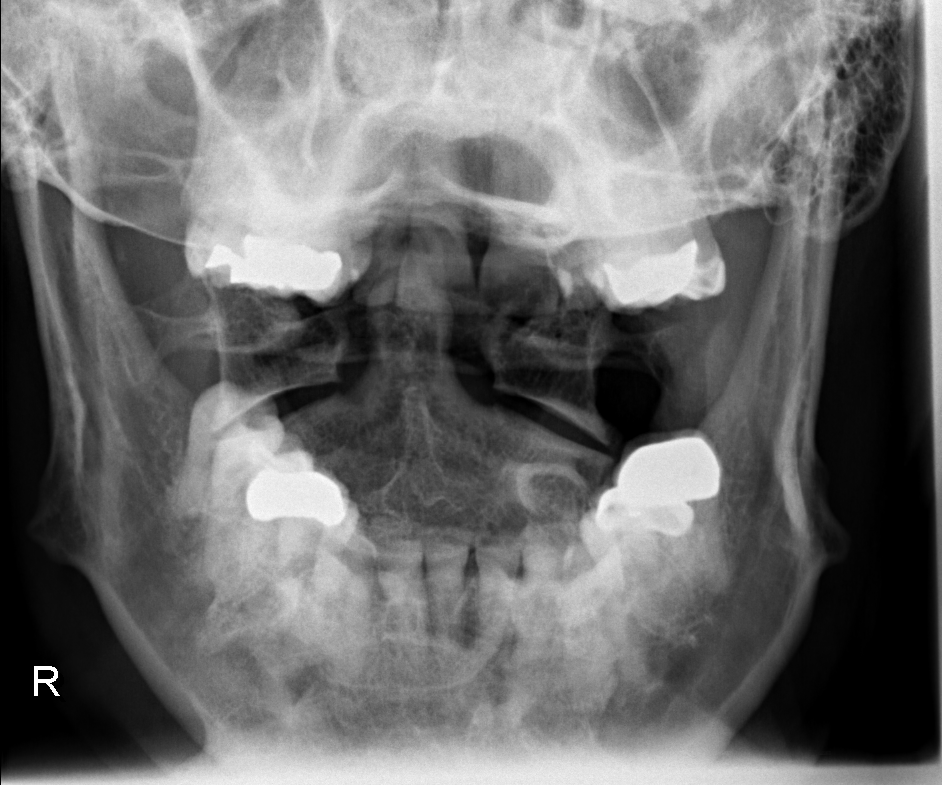

[5 of 5 positions shown; findings below may reference images not displayed]

FINDINGS: No subluxation or fractures. Disc height well maintained.
There is loss of at least some of the normal  cervical lordotic
curve.  This may be due to muscle spasm.

No anomalies.  Prevertebral soft tissues normal.
IMPRESSION: Normal except for loss of at least some of the normal cervical
lordotic curve.

## 2008-09-05 ENCOUNTER — Ambulatory Visit (HOSPITAL_COMMUNITY): Admission: RE | Admit: 2008-09-05 | Discharge: 2008-09-05 | Payer: Self-pay | Admitting: Obstetrics and Gynecology

## 2008-09-05 IMAGING — MG MM DIGITAL SCREENING BILAT
4 series · 4 of 4 positions shown · non-contrast
Comparison: none

DG SCREEN MAMMOGRAM BILATERAL
Bilateral CC and MLO view(s) were taken.

DIGITAL SCREENING MAMMOGRAM WITH CAD:
The breast tissue is extremely dense.  No masses or malignant type calcifications are identified.  
Compared with prior studies.
Images were processed with CAD.

[R CC]
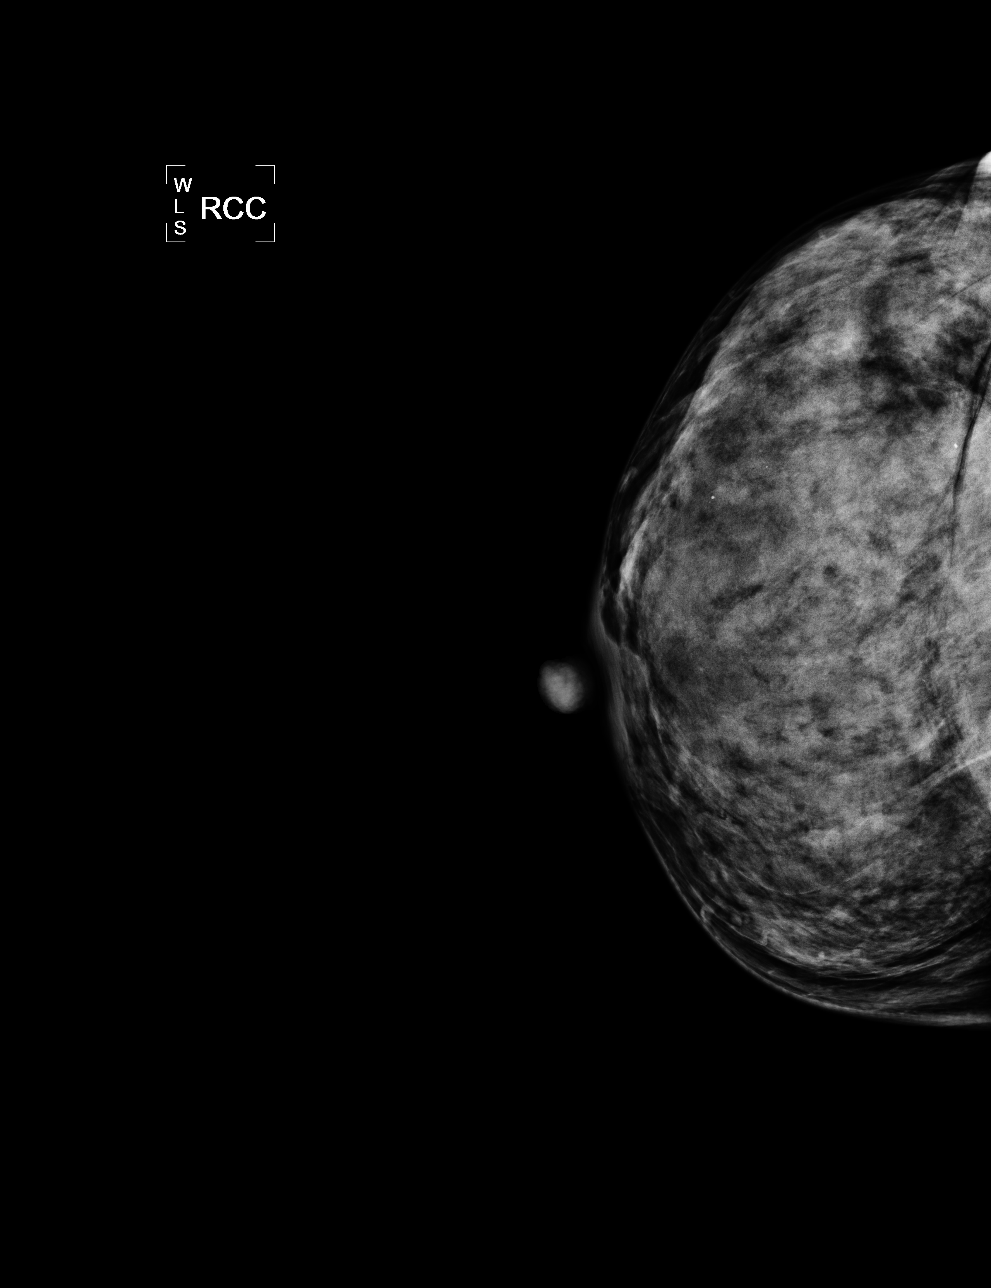

[R MLO]
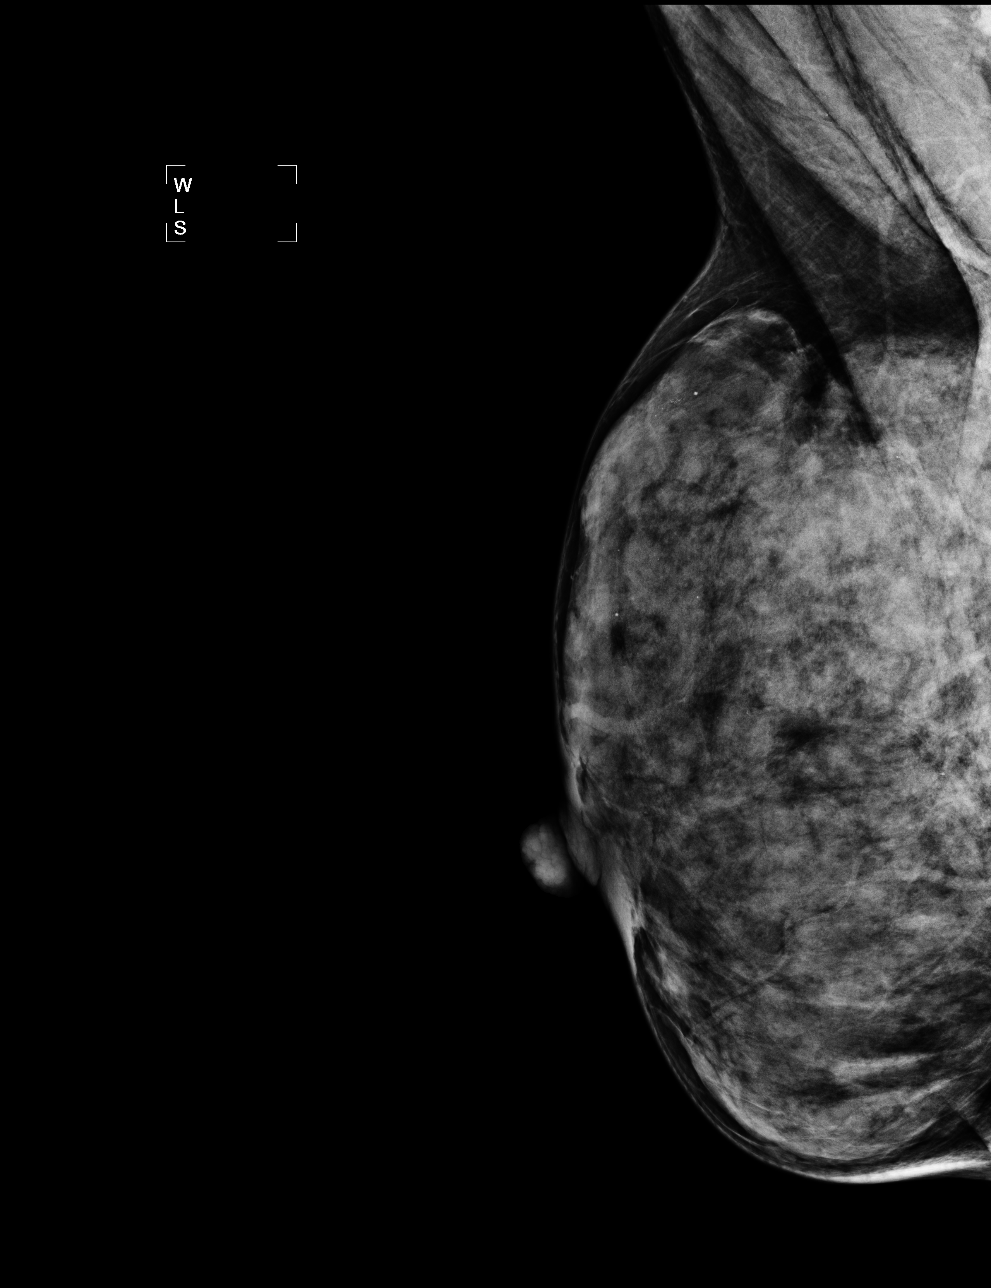

[L CC]
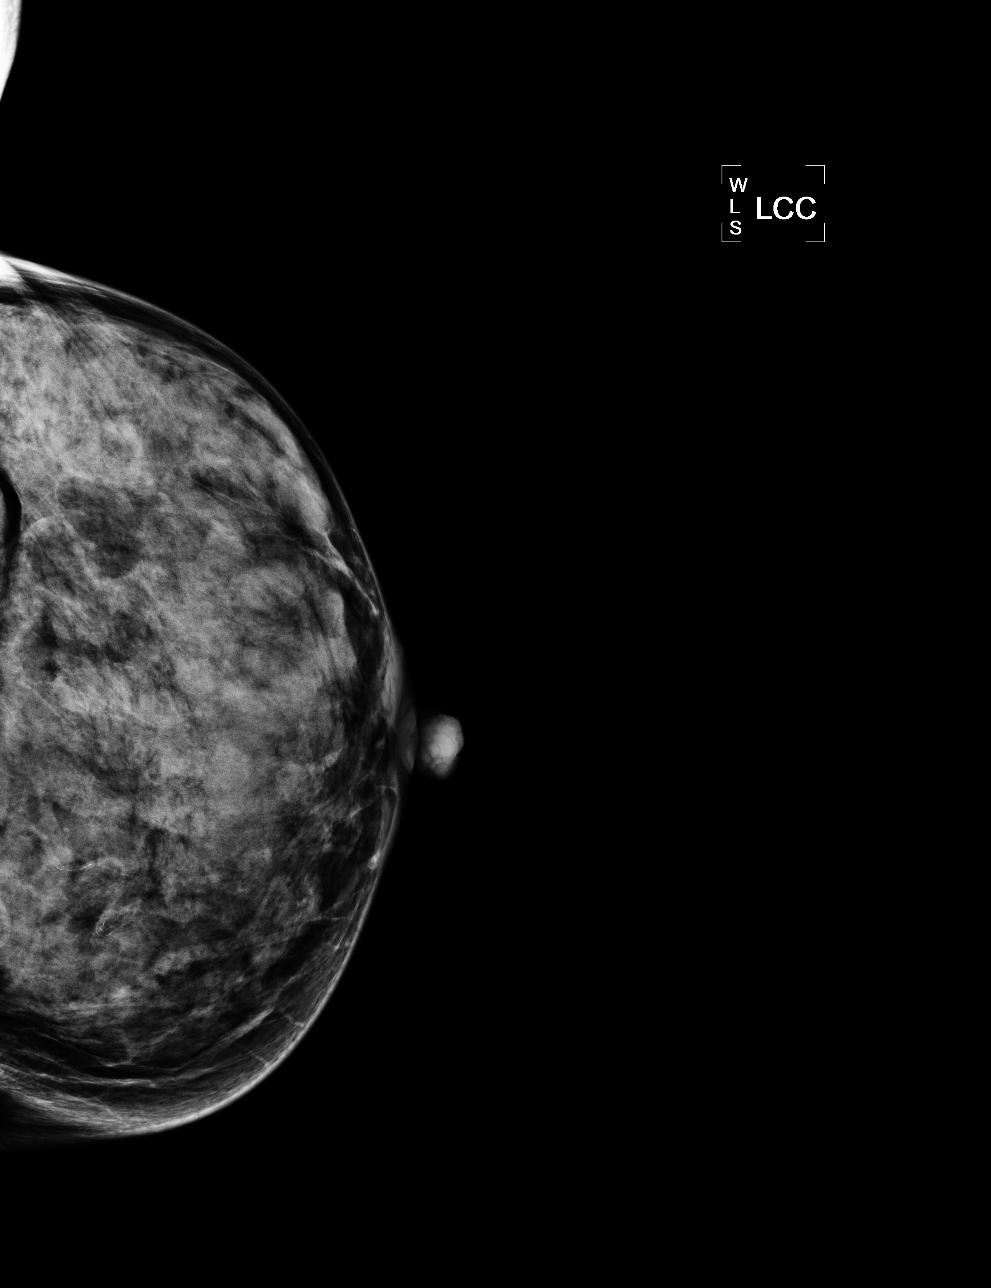

[L MLO]
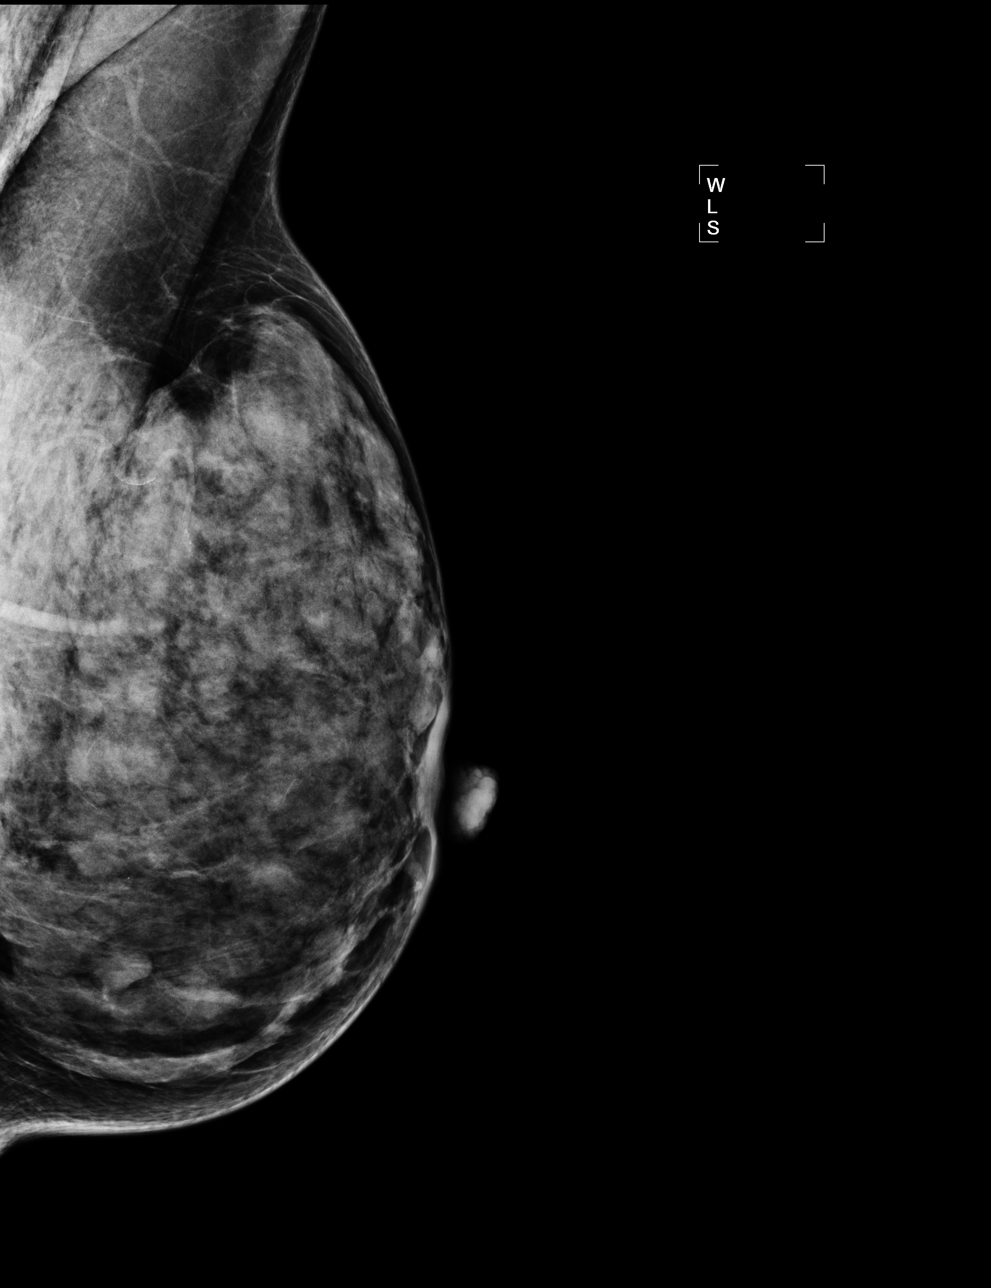

[4 of 4 positions shown; findings below may reference images not displayed]

IMPRESSION: No specific mammographic evidence of malignancy.  Next screening mammogram is recommended in one 
year.

A result letter of this screening mammogram will be mailed directly to the patient.

ASSESSMENT: Negative - BI-RADS 1

Screening mammogram in 1 year.
ANALYZED BY COMPUTER AIDED DETECTION. , THIS PROCEDURE WAS A DIGITAL MAMMOGRAM.

## 2009-03-25 HISTORY — PX: TRANSFORAMINAL LUMBAR INTERBODY FUSION W/ MIS 4 LEVEL: SHX6148

## 2009-11-02 ENCOUNTER — Ambulatory Visit (HOSPITAL_COMMUNITY): Admission: RE | Admit: 2009-11-02 | Discharge: 2009-11-02 | Payer: Self-pay | Admitting: Obstetrics and Gynecology

## 2009-11-02 IMAGING — MG MM DIGITAL SCREENING
5 series · 5 of 5 positions shown · non-contrast
Comparison: Prior studies.

DG SCREEN MAMMOGRAM BILATERAL
Bilateral CC and MLO view(s) were taken.
Technologist: TEWODROS.(TEWODROS)([email or web address])

DIGITAL SCREENING MAMMOGRAM WITH CAD:

[R CC (1 of 2)]
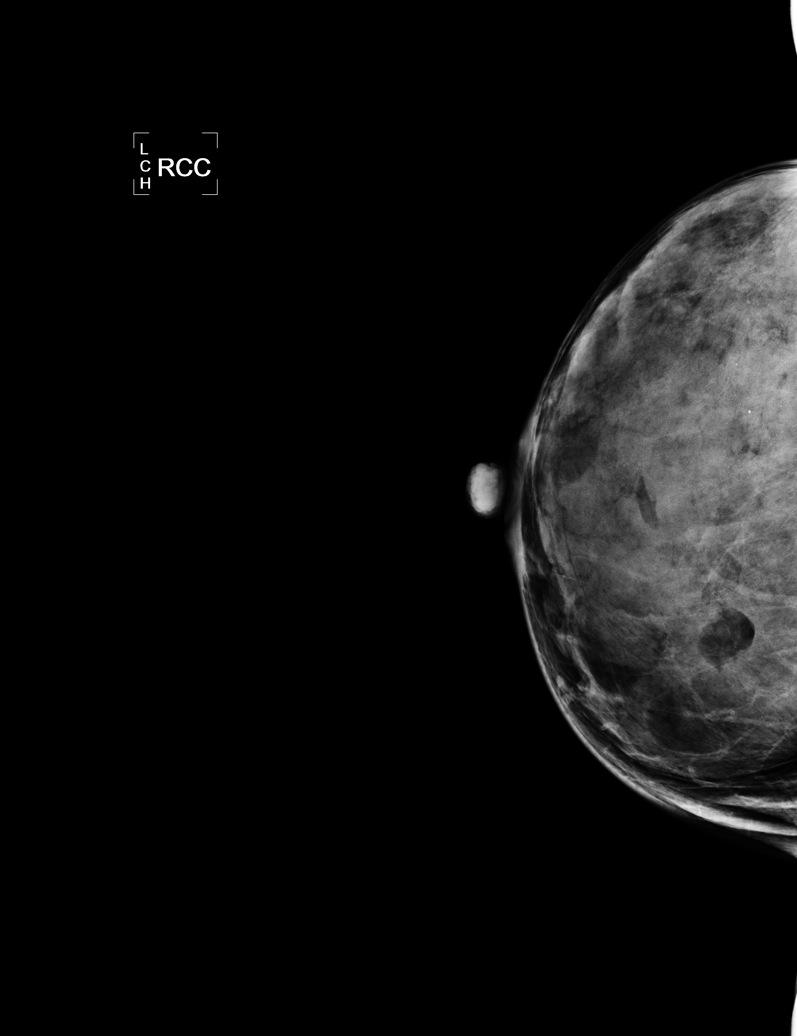

[R MLO]
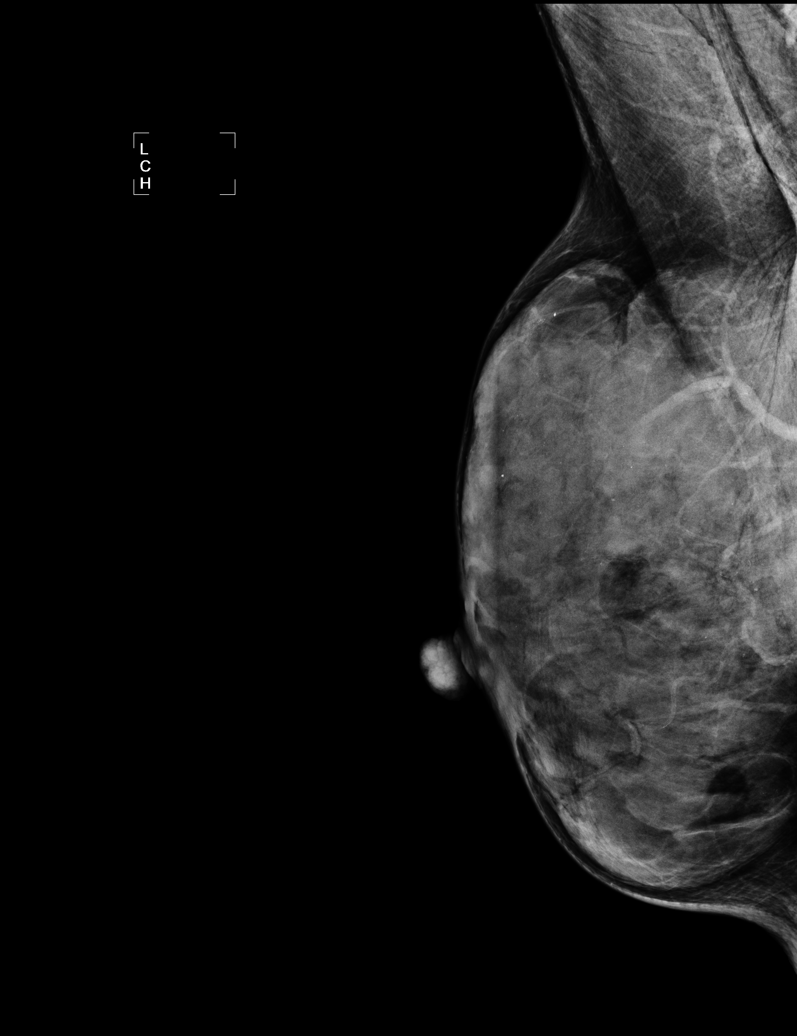

[L CC]
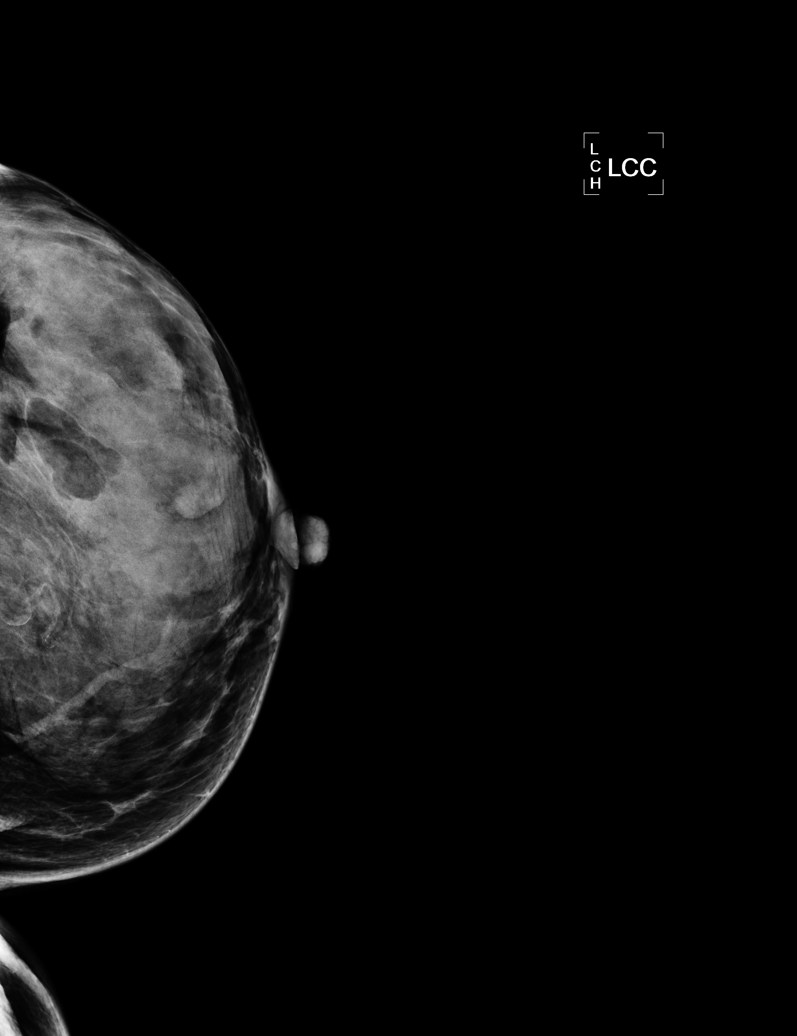

[L MLO]
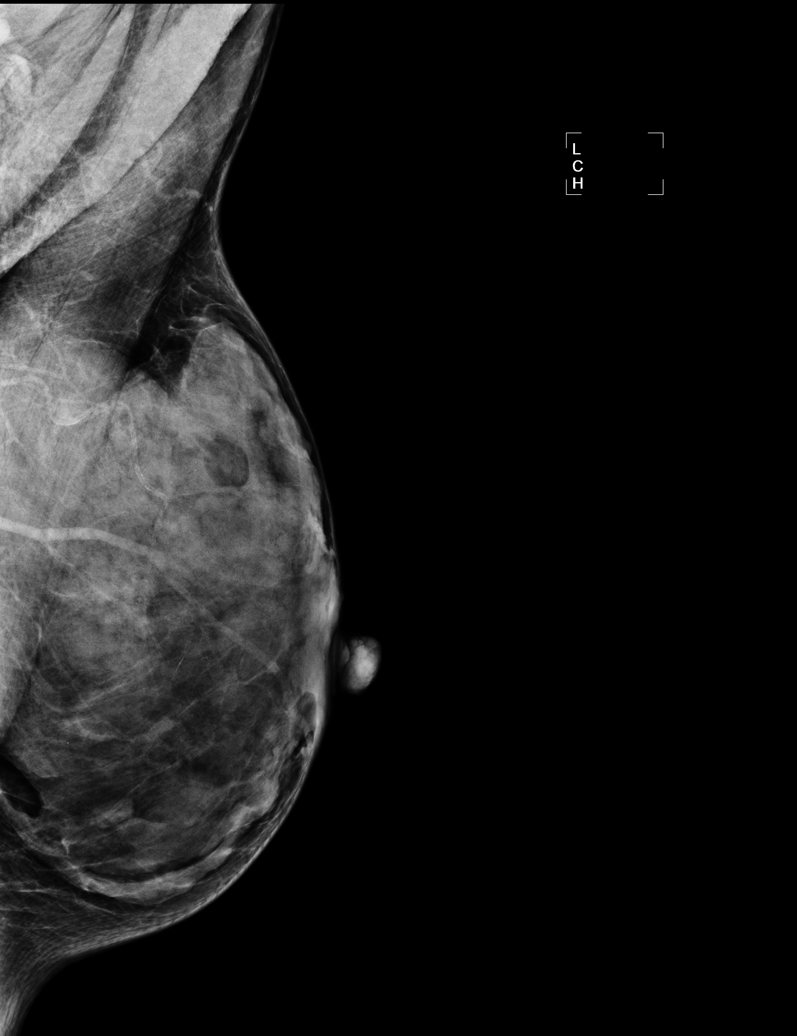

[R CC (2 of 2)]
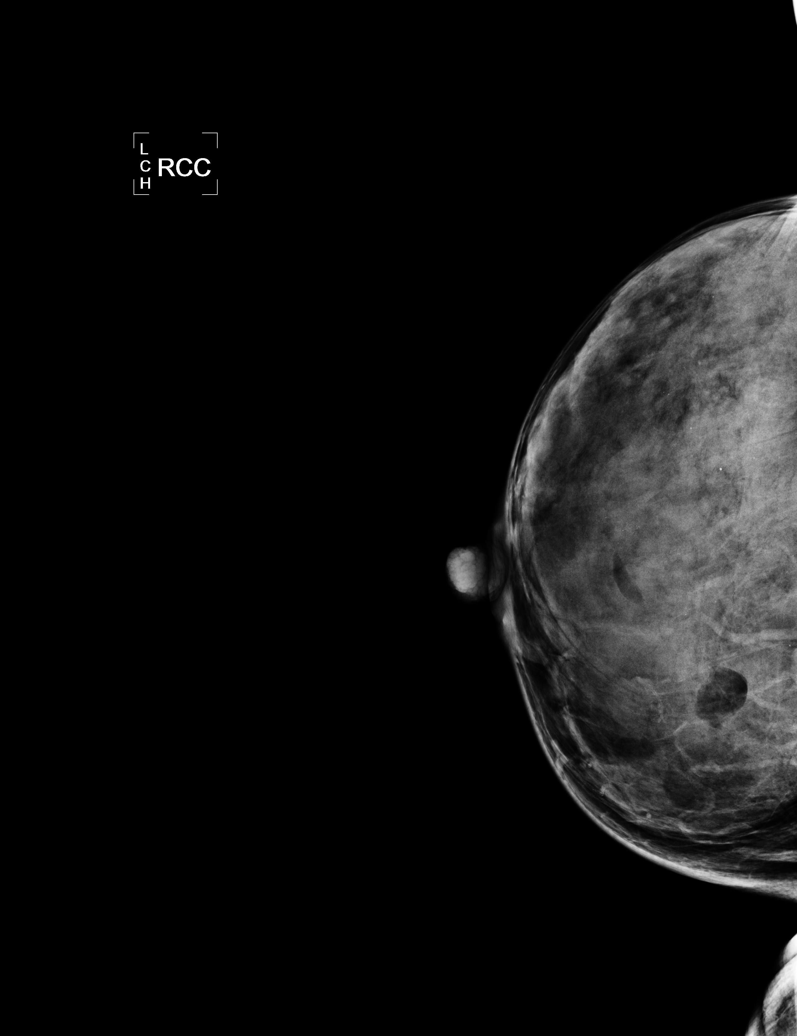

[5 of 5 positions shown; findings below may reference images not displayed]

The breast tissue is extremely dense.  There is no dominant mass, architectural distortion or 
calcification to suggest malignancy.

Images were processed with CAD.
IMPRESSION: No mammographic evidence of malignancy.  Suggest yearly screening mammography.

A result letter of this screening mammogram will be mailed directly to the patient.

ASSESSMENT: Negative - BI-RADS 1

Screening mammogram in 1 year.
,

## 2009-12-11 ENCOUNTER — Encounter: Admission: RE | Admit: 2009-12-11 | Discharge: 2009-12-11 | Payer: Self-pay | Admitting: Obstetrics and Gynecology

## 2009-12-11 IMAGING — US US BREAST L
1 series · 4 of 4 positions shown · non-contrast
Comparison: Mammogram dated [DATE] and multiple priors

CLINICAL DATA: Burning pain upper-outer quadrant left breast

LEFT BREAST ULTRASOUND

[Series 1: us breast left · 4 of 4 slices shown]
[im 1/4]
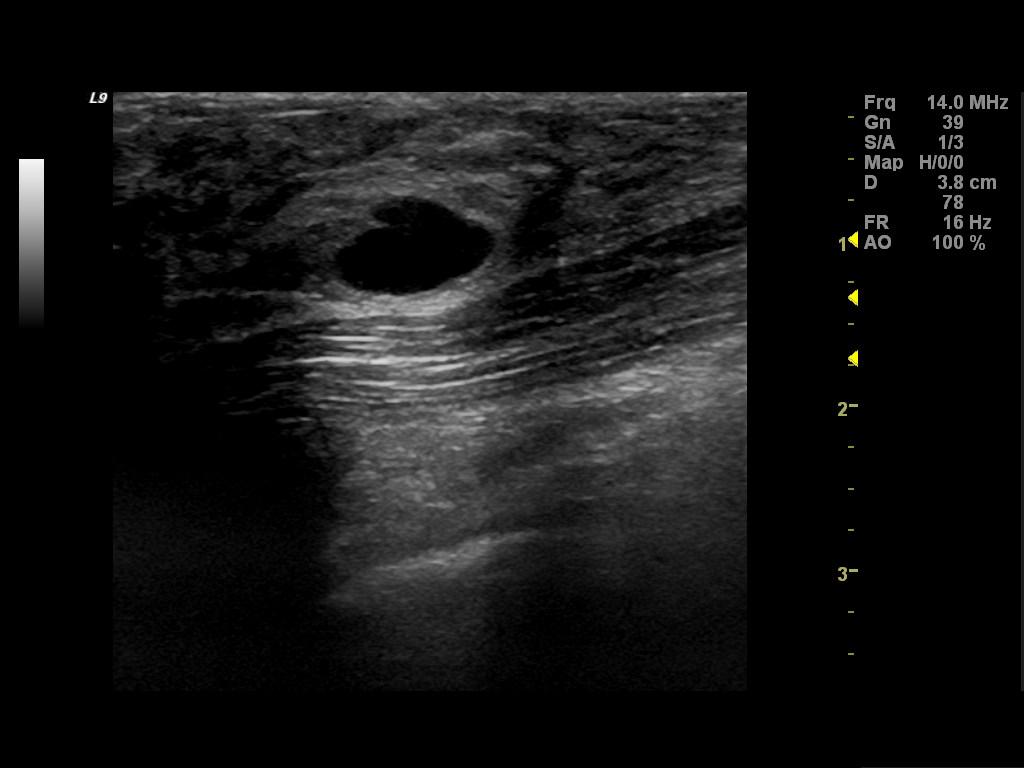
[im 2/4]
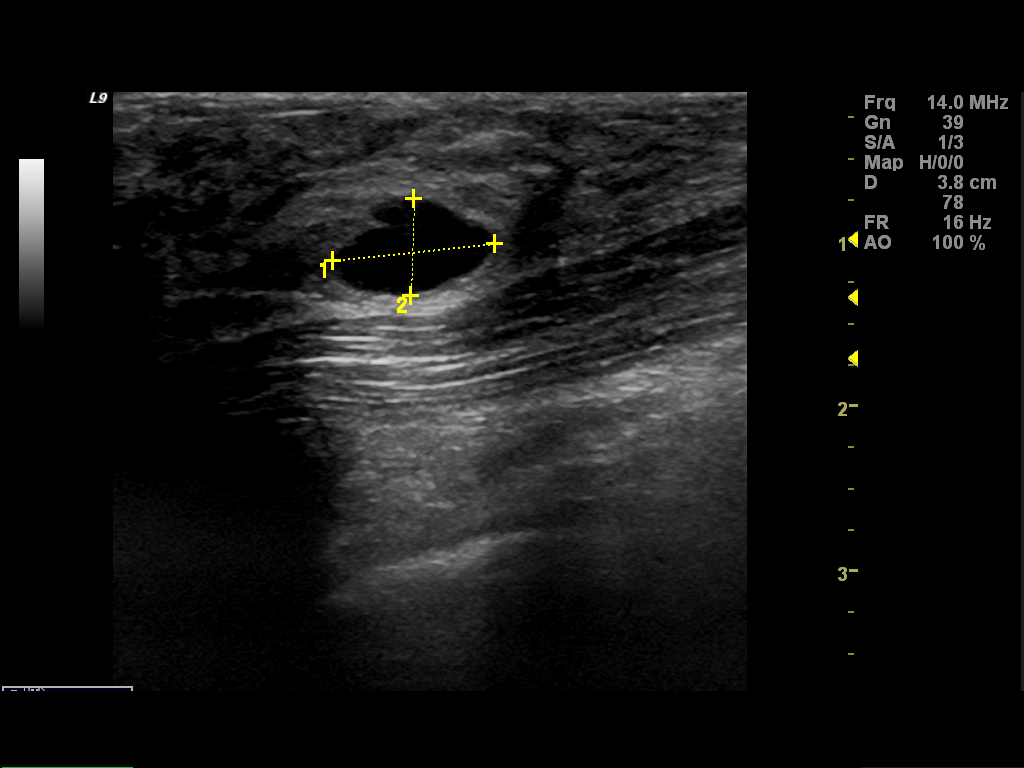
[im 3/4]
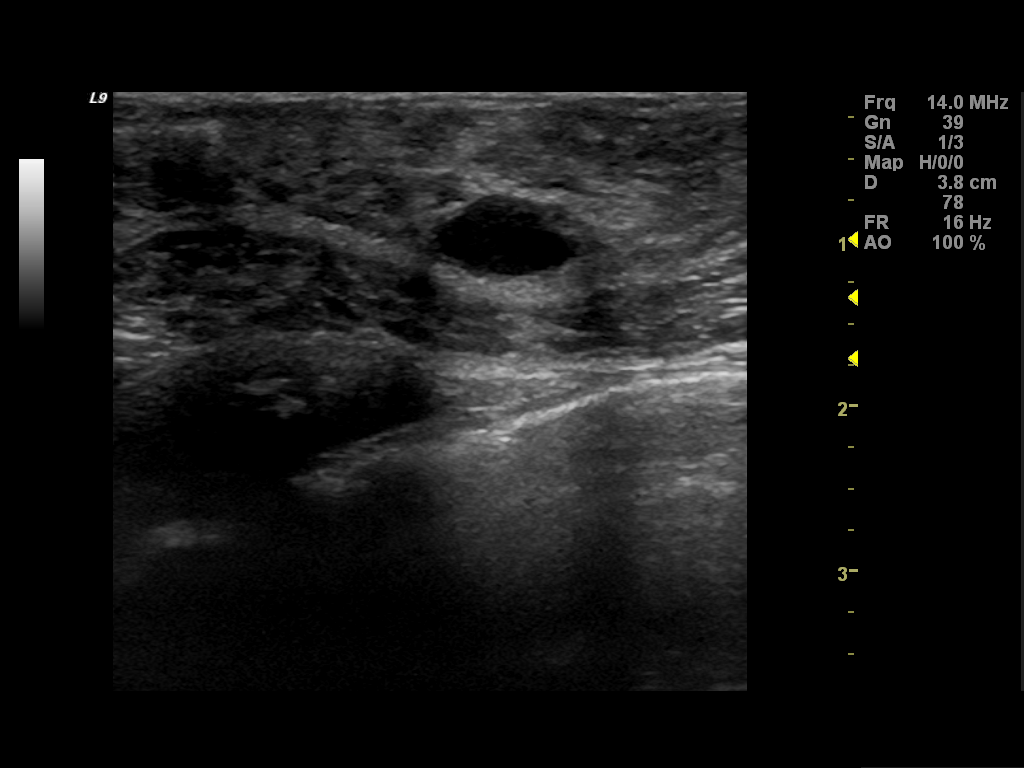
[im 4/4]
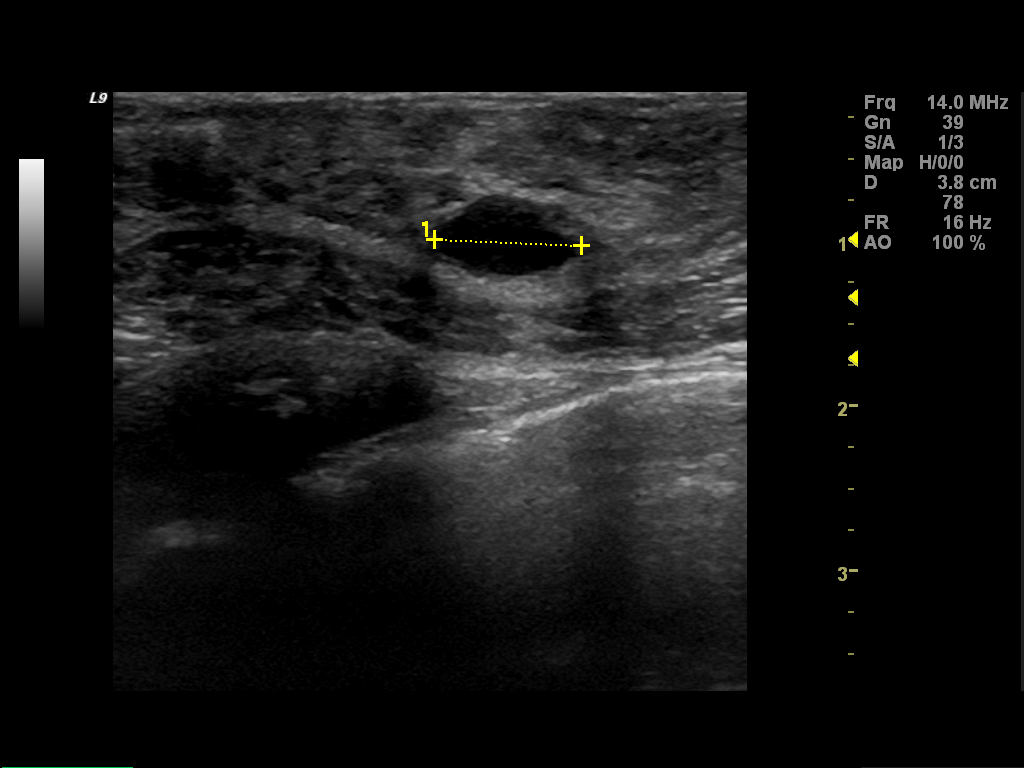

[4 of 4 positions shown; findings below may reference images not displayed]

On physical exam, I palpate a firm, mobile mass in the 2 o'clock
position, 5 cm from the left nipple.  No other masses are palpated.
FINDINGS: Ultrasound is performed, showing a cyst measuring 0.9 x
1.0 x 0.6 cm in the 2 o'clock position, 5 cm from the left nipple.
Multiple other sub centimeter cysts are imaged.  The option of cyst
aspiration was discussed with the patient although, it is deferred
at this time.
IMPRESSION: Cysts, left breast.  Recommend annual screening mammography due in
[DATE].

BI-RADS CATEGORY 2:  Benign finding(s).

## 2009-12-19 ENCOUNTER — Inpatient Hospital Stay (HOSPITAL_COMMUNITY): Admission: RE | Admit: 2009-12-19 | Discharge: 2009-12-21 | Payer: Self-pay | Admitting: Neurosurgery

## 2009-12-19 IMAGING — RF DG LUMBAR SPINE 2-3V
1 series · 4 of 4 positions shown · non-contrast
Comparison: CT myelogram [DATE]

CLINICAL DATA: Back pain

LUMBAR SPINE - 2-3 VIEW

[Series 1: run · 4 of 4 slices shown]
[im 1/4]
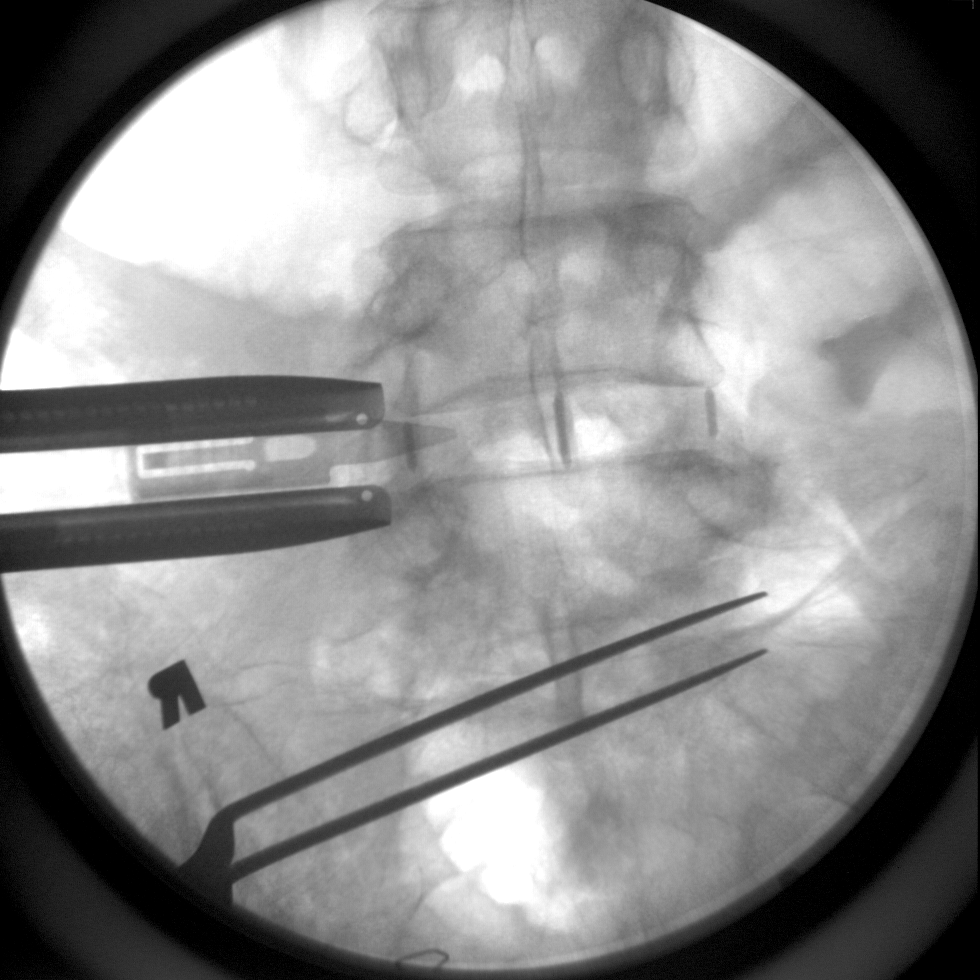
[im 2/4]
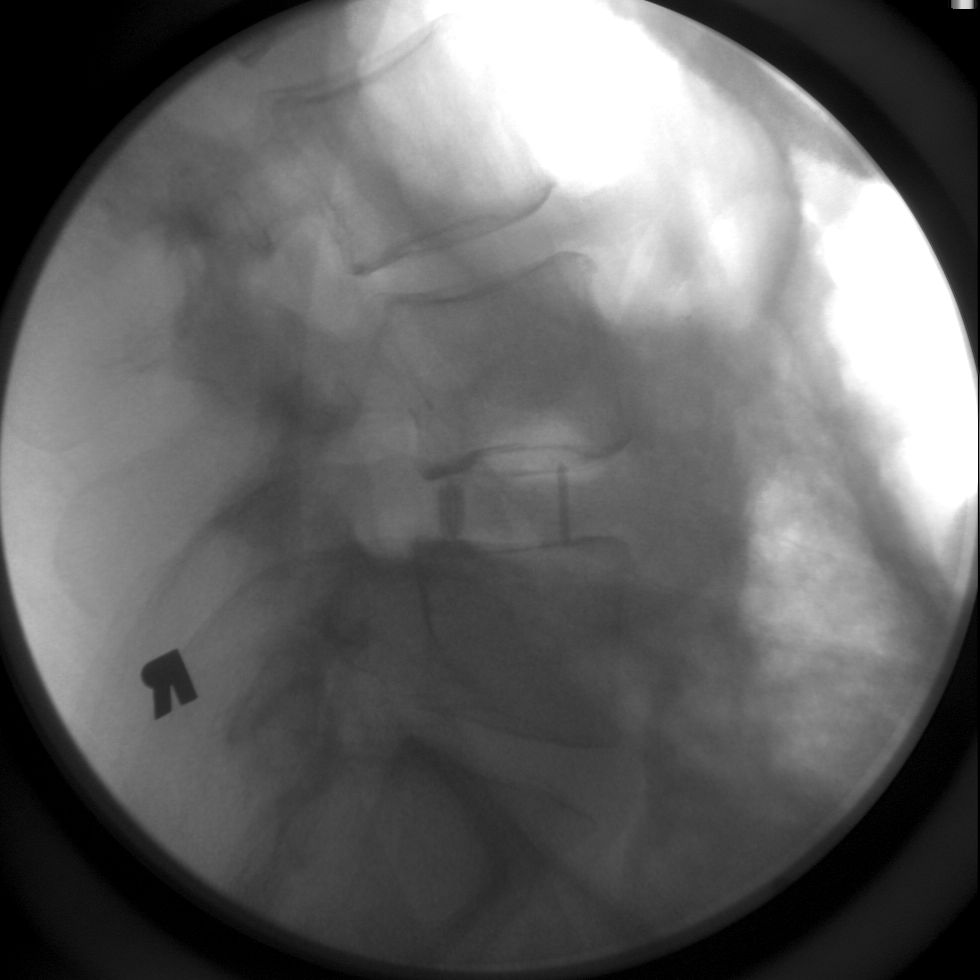
[im 3/4]
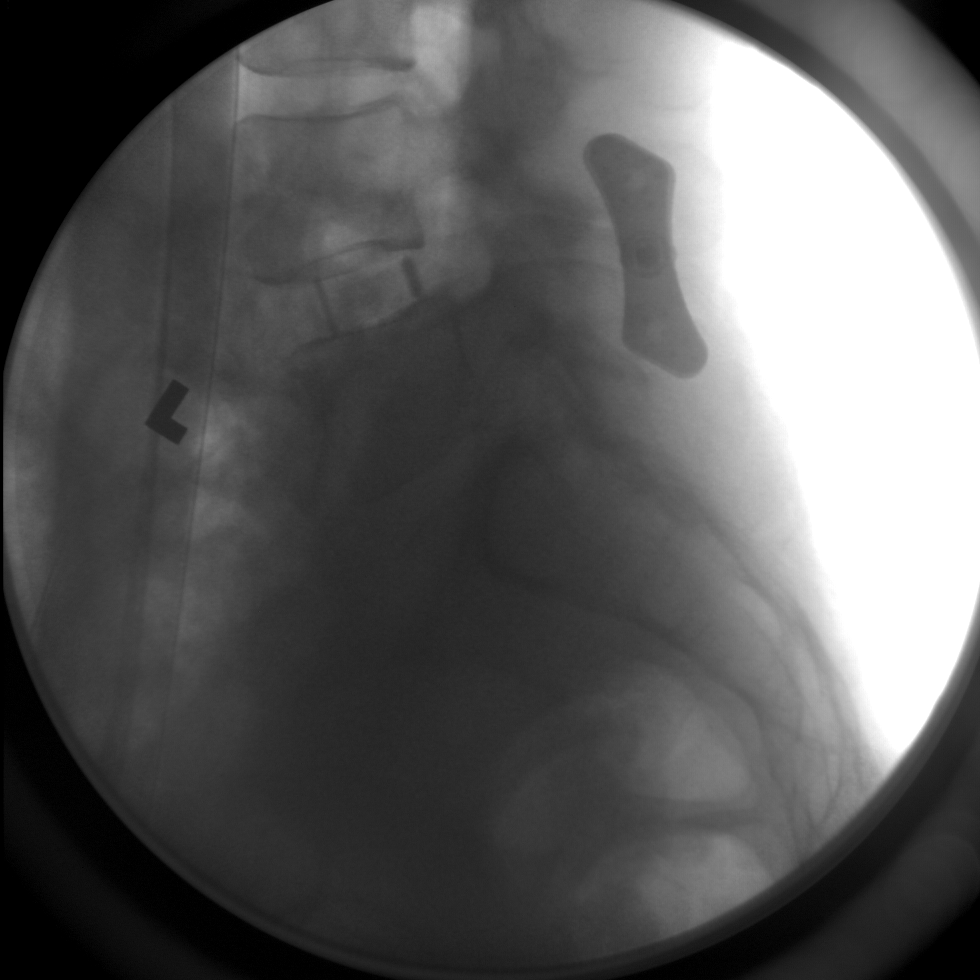
[im 4/4]
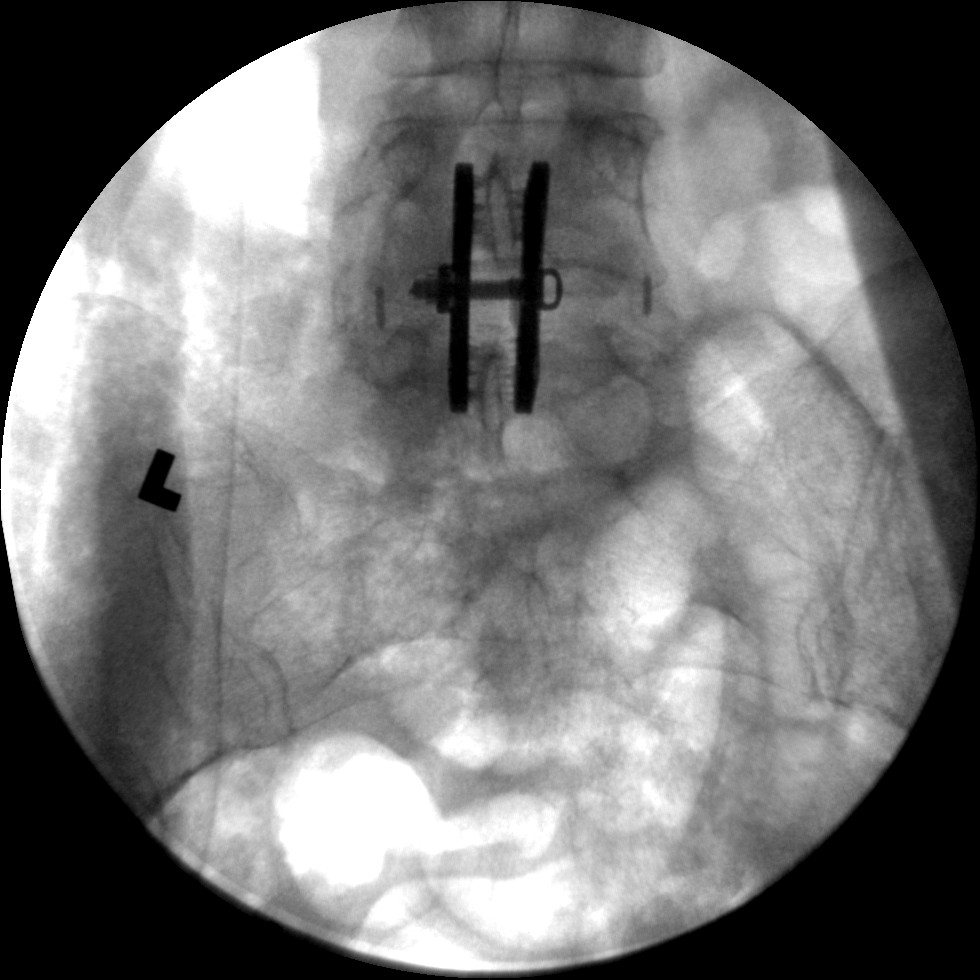

[4 of 4 positions shown; findings below may reference images not displayed]

FINDINGS: Intraoperative C-arm films demonstrate XLIF via a right
lateral approach augmented with posterior spinous process plate at
L4-5.
IMPRESSION: As above.

## 2010-04-14 ENCOUNTER — Encounter: Payer: Self-pay | Admitting: Orthopedic Surgery

## 2010-04-15 ENCOUNTER — Encounter: Payer: Self-pay | Admitting: Obstetrics and Gynecology

## 2010-04-15 ENCOUNTER — Encounter: Payer: Self-pay | Admitting: Orthopedic Surgery

## 2010-06-07 LAB — CBC
HCT: 42.1 % (ref 36.0–46.0)
Hemoglobin: 14.2 g/dL (ref 12.0–15.0)
MCH: 31.8 pg (ref 26.0–34.0)
MCHC: 33.7 g/dL (ref 30.0–36.0)
MCV: 94.2 fL (ref 78.0–100.0)
Platelets: 248 10*3/uL (ref 150–400)
RBC: 4.47 MIL/uL (ref 3.87–5.11)
RDW: 12.7 % (ref 11.5–15.5)
WBC: 6.9 10*3/uL (ref 4.0–10.5)

## 2010-06-07 LAB — SURGICAL PCR SCREEN
MRSA, PCR: NEGATIVE
Staphylococcus aureus: NEGATIVE

## 2010-06-07 LAB — TYPE AND SCREEN
ABO/RH(D): O POS
Antibody Screen: NEGATIVE

## 2010-06-07 LAB — ABO/RH: ABO/RH(D): O POS

## 2010-10-30 ENCOUNTER — Other Ambulatory Visit (HOSPITAL_COMMUNITY): Payer: Self-pay | Admitting: Obstetrics and Gynecology

## 2010-10-30 DIAGNOSIS — Z1231 Encounter for screening mammogram for malignant neoplasm of breast: Secondary | ICD-10-CM

## 2010-11-06 ENCOUNTER — Ambulatory Visit (HOSPITAL_COMMUNITY): Payer: BC Managed Care – PPO

## 2010-11-19 ENCOUNTER — Ambulatory Visit (HOSPITAL_COMMUNITY)
Admission: RE | Admit: 2010-11-19 | Discharge: 2010-11-19 | Disposition: A | Discharge status: Home / Self Care | Payer: BC Managed Care – PPO | Source: Ambulatory Visit | Attending: Obstetrics and Gynecology | Admitting: Obstetrics and Gynecology

## 2010-11-19 DIAGNOSIS — Z1231 Encounter for screening mammogram for malignant neoplasm of breast: Secondary | ICD-10-CM | POA: Insufficient documentation

## 2010-11-19 IMAGING — MG MM DIGITAL SCREENING {WH}
4 series · 4 of 4 positions shown · non-contrast
Comparison: Prior studies [DATE].

DG SCREEN MAMMOGRAM BILATERAL
Bilateral CC and MLO view(s) were taken.
Radiologist: MEHANDRA, M.D.

BILATERAL DIGITAL SCREENING MAMMOGRAM WITH CAD

[R CC]
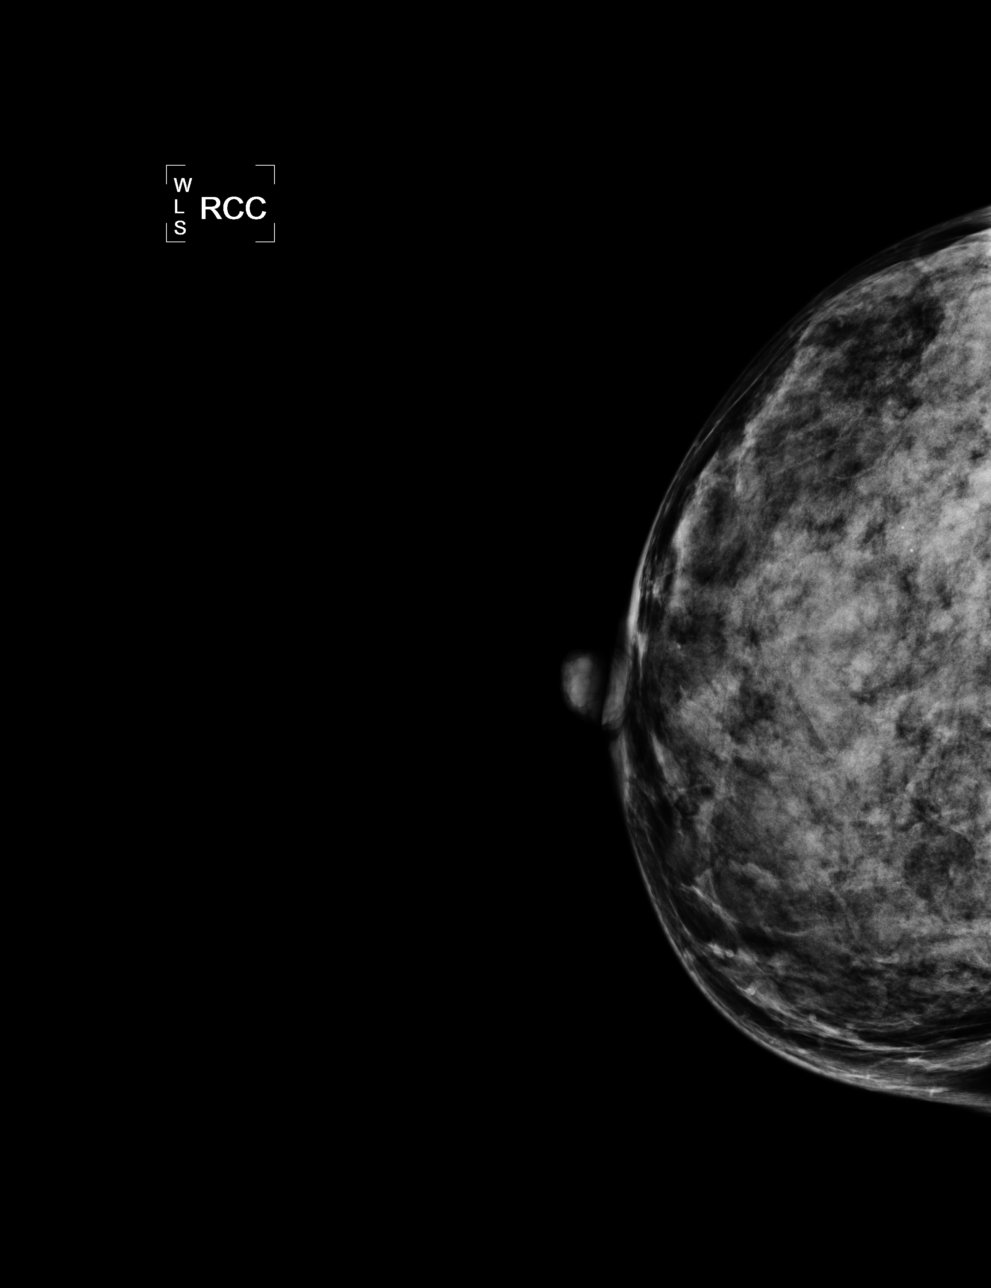

[R MLO]
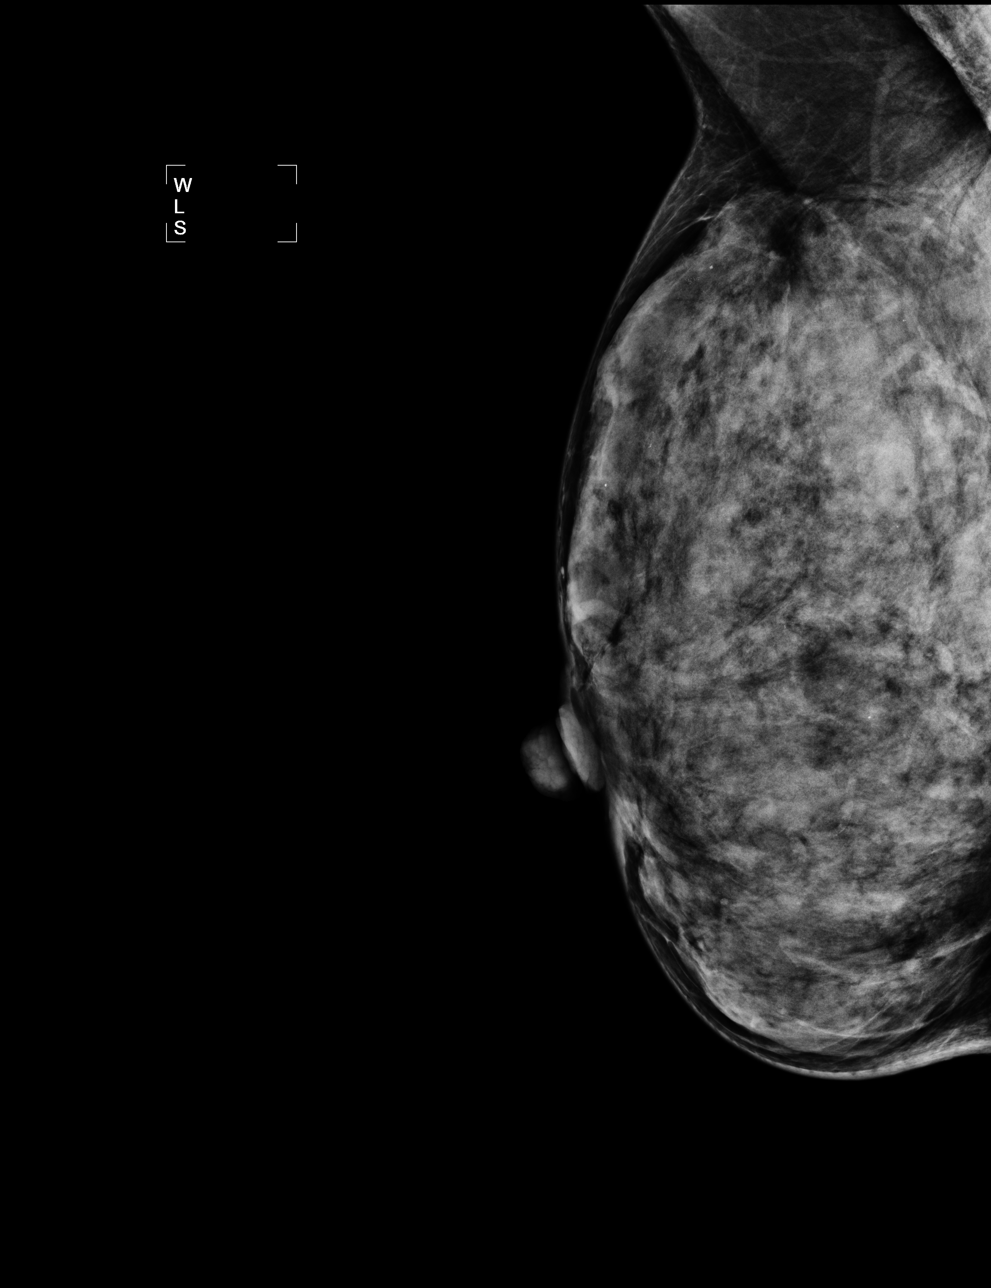

[L CC]
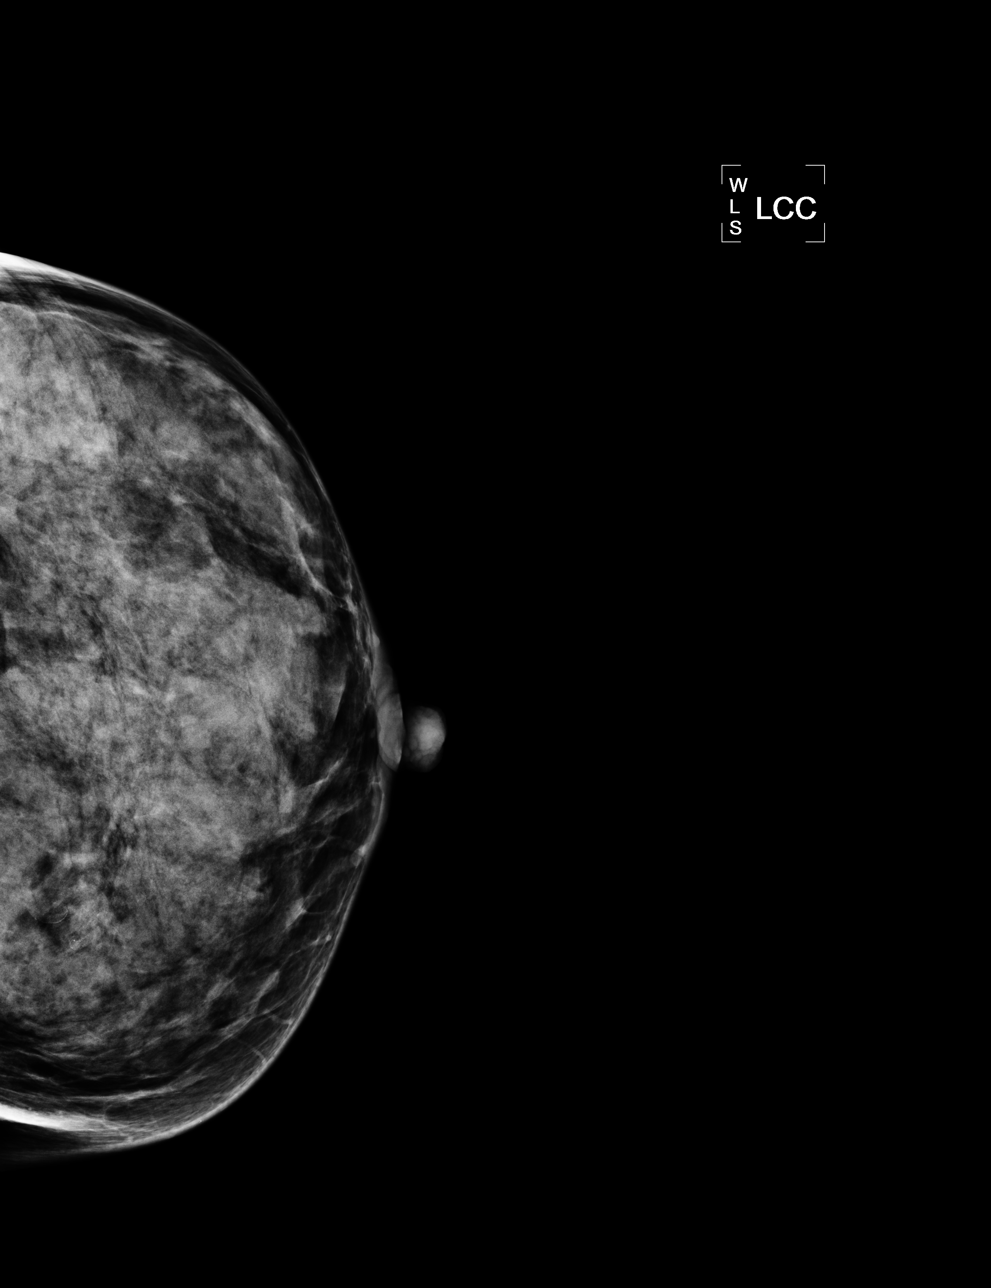

[L MLO]
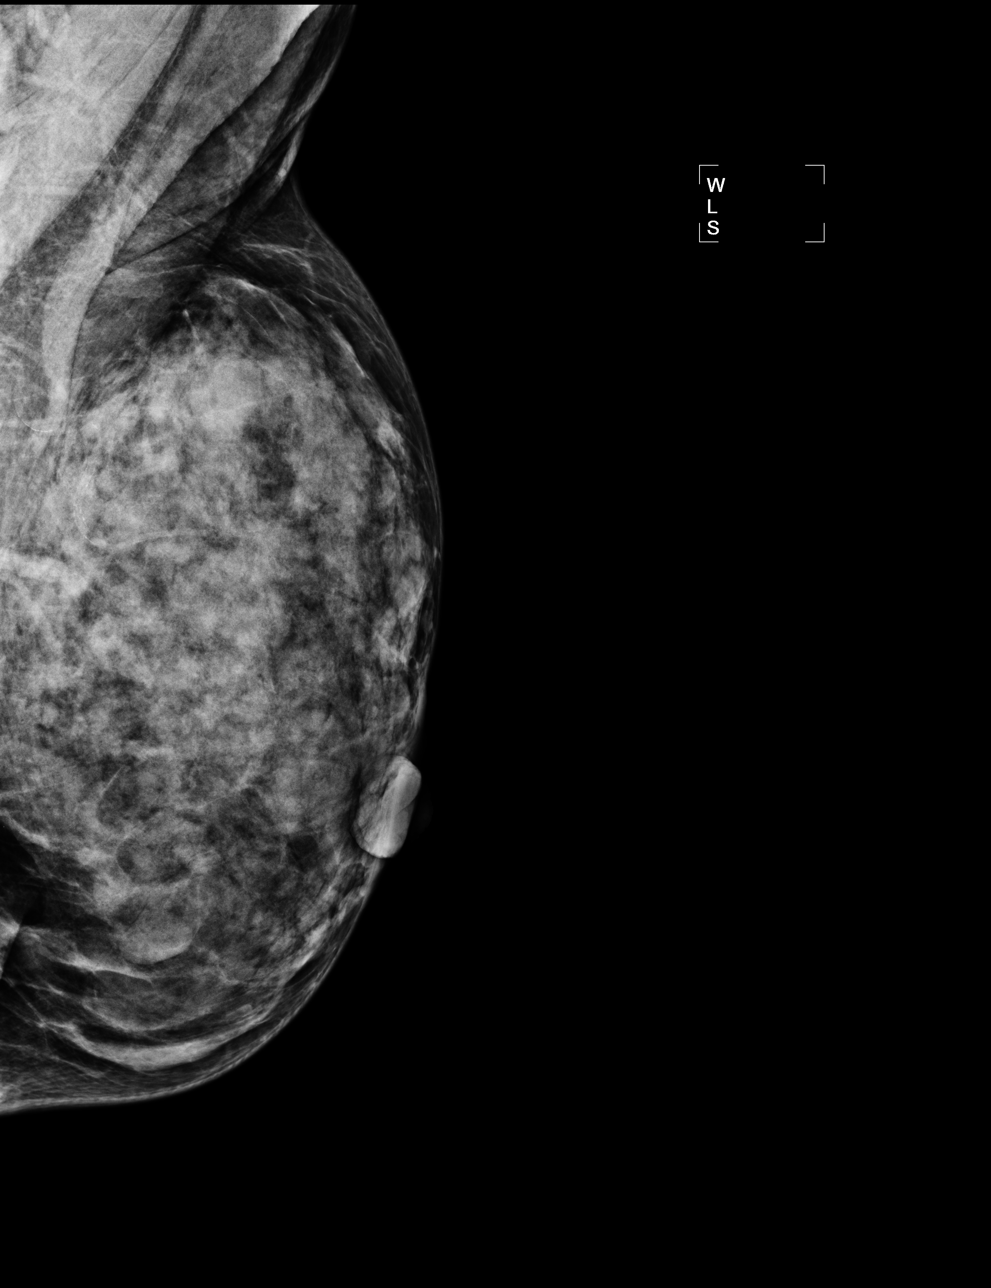

[4 of 4 positions shown; findings below may reference images not displayed]

FINDINGS: Prior films are needed for interpretation.
IMPRESSION: Prior exams will be requested for comparison.  Following comparison with prior studies an addendum 
will be made regarding further recommendations.
Images were processed with CAD.

ASSESSMENT: Negative - BI-RADS 1

Screening mammogram of both breasts in 1 year.
Addendum

DIGITAL SCREENING MAMMOGRAM WITH CAD:
The breast tissue is extremely dense.  There is no dominant mass, architectural distortion or 
calcification to suggest malignancy.

Images were processed with CAD.
IMPRESSION: No mammographic evidence of malignancy.  Suggest yearly screening mammography.

A result letter of this screening mammogram will be mailed directly to the patient.
,

## 2011-02-01 ENCOUNTER — Other Ambulatory Visit: Payer: Self-pay | Admitting: Obstetrics and Gynecology

## 2011-02-01 DIAGNOSIS — N644 Mastodynia: Secondary | ICD-10-CM

## 2011-02-15 ENCOUNTER — Inpatient Hospital Stay: Admission: RE | Admit: 2011-02-15 | Payer: BC Managed Care – PPO | Source: Ambulatory Visit

## 2011-03-04 ENCOUNTER — Other Ambulatory Visit: Payer: Self-pay | Admitting: Obstetrics and Gynecology

## 2011-03-04 ENCOUNTER — Ambulatory Visit
Admission: RE | Admit: 2011-03-04 | Discharge: 2011-03-04 | Disposition: A | Discharge status: Home / Self Care | Payer: BC Managed Care – PPO | Source: Ambulatory Visit | Attending: Obstetrics and Gynecology | Admitting: Obstetrics and Gynecology

## 2011-03-04 DIAGNOSIS — N644 Mastodynia: Secondary | ICD-10-CM

## 2011-03-04 IMAGING — US US BREAST LEFT
1 series · 4 of 4 positions shown · non-contrast
Comparison: [DATE], [DATE], [DATE], [DATE], and
[DATE].

CLINICAL DATA: Tenderness and thickening within the lateral left
breast and in the region of the left anterior axillary line.  The
patient notes that this is irritated by her bra strap and exercise.

DIGITAL DIAGNOSTIC LEFT BREAST MAMMOGRAM WITH CAD AND LEFT BREAST
ULTRASOUND:

[Series 1: us breast left · 4 of 4 slices shown]
[im 1/4]
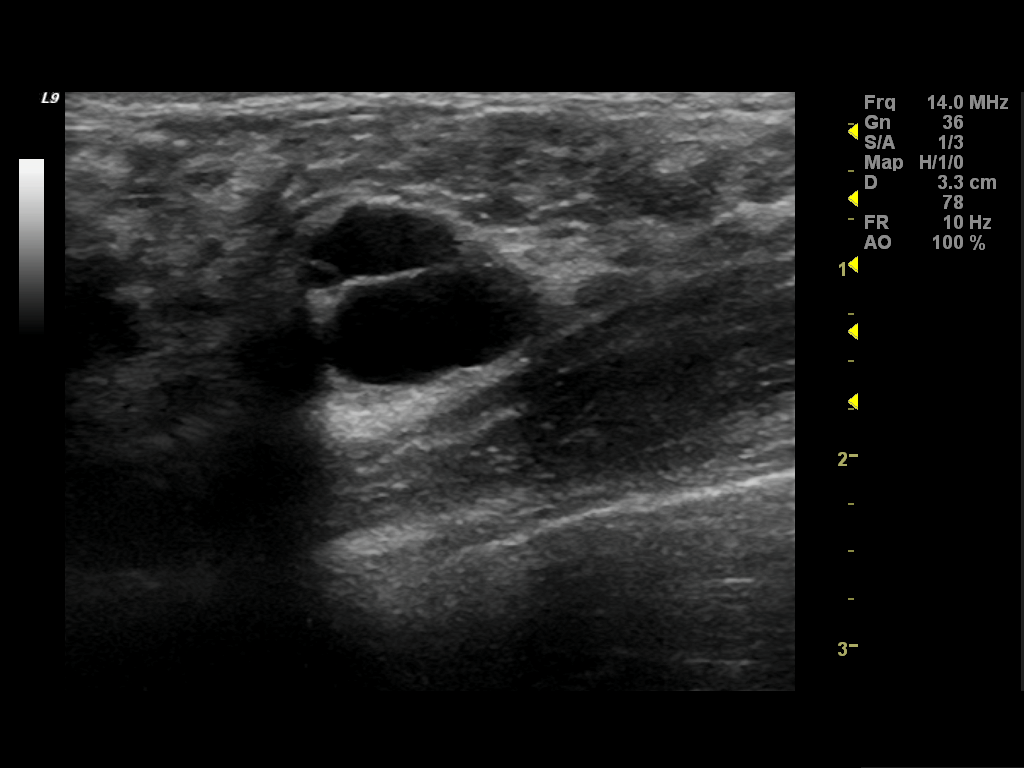
[im 2/4]
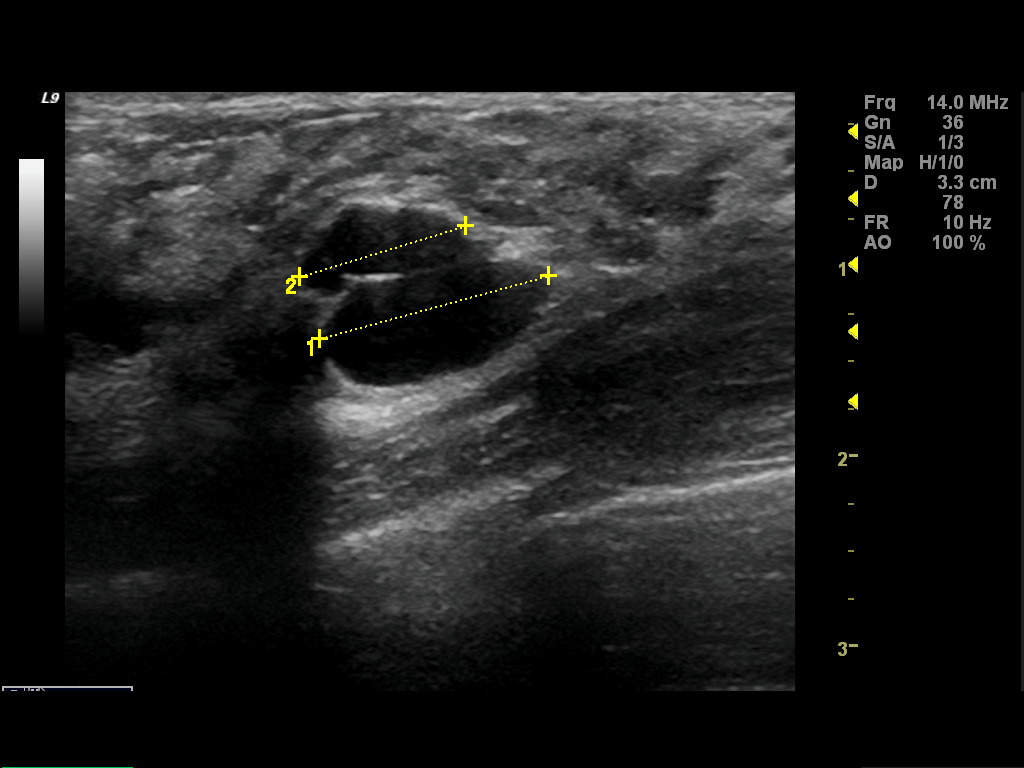
[im 3/4]
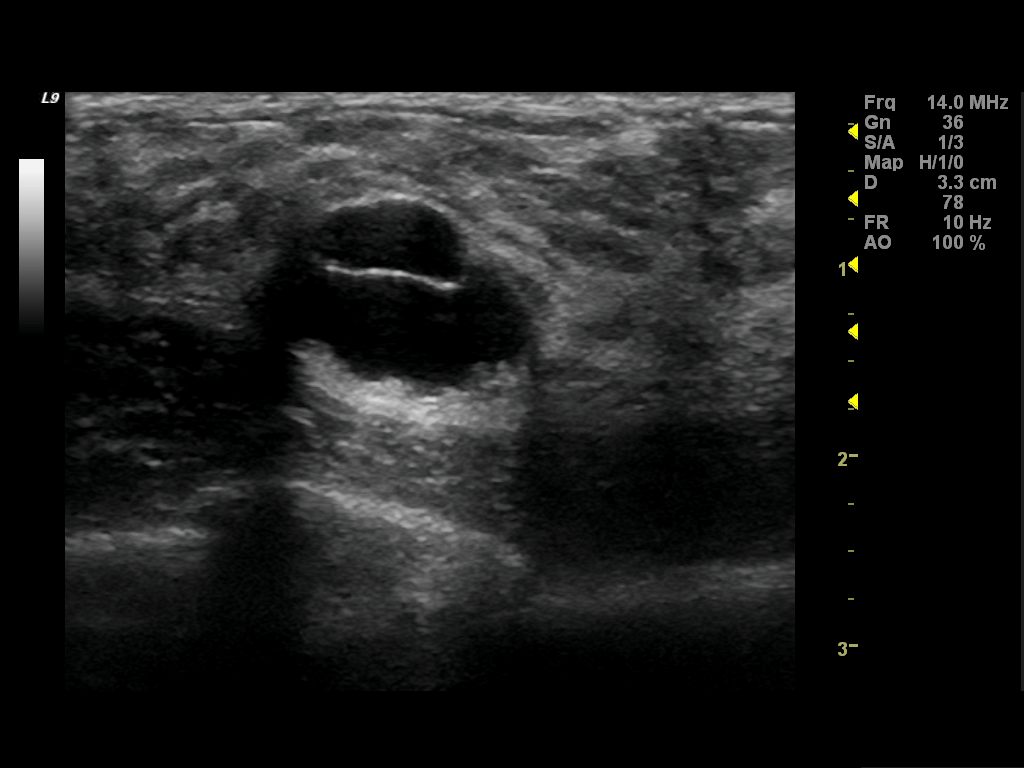
[im 4/4]
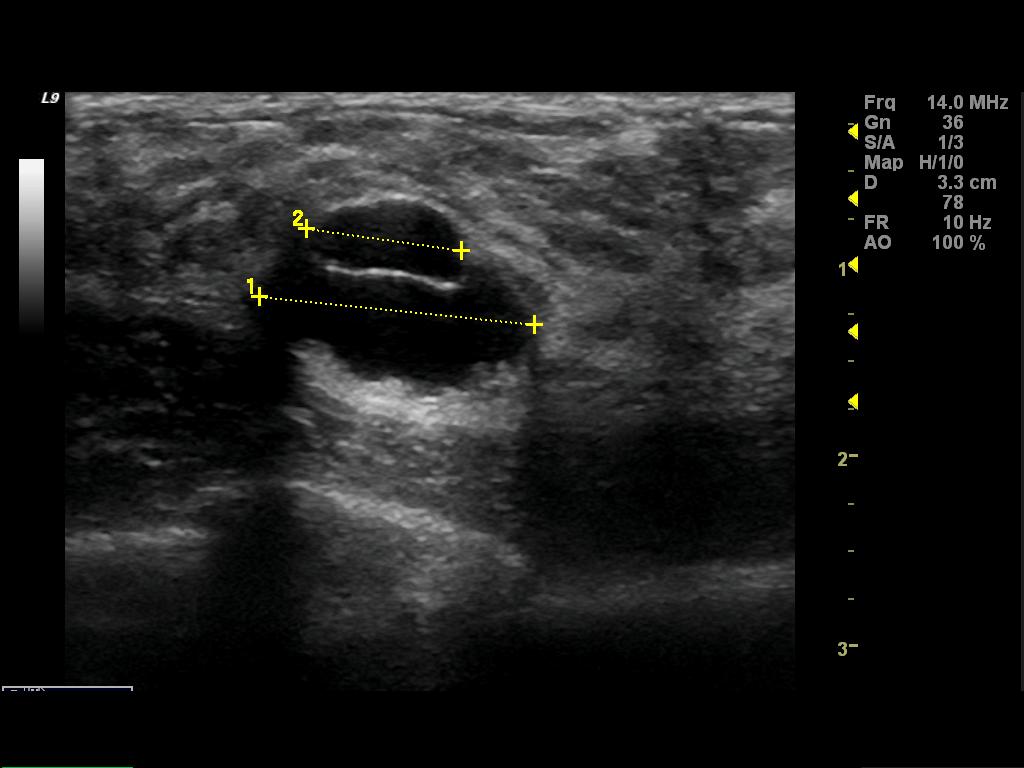

[4 of 4 positions shown; findings below may reference images not displayed]

FINDINGS: There is an extremely dense breast parenchymal pattern.
There is no evidence by mammography for a mass or distortion and
there is no worrisome calcification.  There is mild vascular
calcification seen.
Mammographic images were processed with CAD.

On physical exam, there is mild thickening within the lateral left
breast.  There is no discrete palpable mass.  There is tenderness
in the region of the lower left anterior costal cartilage.

Ultrasound is performed, showing several adjacent simple appearing
cysts located within the left breast at the 3 o'clock position 6 cm
from the nipple.  The largest cyst measures 1.5 cm in greatest
dimension.  There is no mass, distortion, or worrisome shadowing.
The patient's tenderness is located overlying anterior costal
cartilage.
IMPRESSION: Simple appearing cysts located within the left breast at the 3
o'clock position.  The largest cyst measures 1.5 cm in size.  There
are no findings worrisome for developing malignancy.  The patient's
tenderness is in the region of her anterior costal cartilage and is
possibly related to costochondritis or underlying rib abnormality.
Recommend screening mammography in [DATE].

BI-RADS CATEGORY 2:  Benign finding(s).

## 2011-08-29 ENCOUNTER — Other Ambulatory Visit: Payer: Self-pay | Admitting: Neurosurgery

## 2011-08-29 DIAGNOSIS — M545 Low back pain, unspecified: Secondary | ICD-10-CM

## 2011-09-04 ENCOUNTER — Ambulatory Visit
Admission: RE | Admit: 2011-09-04 | Discharge: 2011-09-04 | Disposition: A | Discharge status: Home / Self Care | Payer: BC Managed Care – PPO | Source: Ambulatory Visit | Attending: Neurosurgery | Admitting: Neurosurgery

## 2011-09-04 DIAGNOSIS — M545 Low back pain, unspecified: Secondary | ICD-10-CM

## 2011-09-04 IMAGING — CT CT L SPINE W/O CM
4 of 11 series · 10 of 33 positions shown, 12 images · non-contrast
Comparison: lumbar MRI [DATE]

CLINICAL DATA: Evaluate fusion healing.  Surgery [DATE].
Bilateral leg pain.  Low back pain.

CT LUMBAR SPINE WITHOUT CONTRAST
TECHNIQUE: Multidetector CT imaging of the lumbar spine was
performed without intravenous contrast administration. Multiplanar
CT image reconstructions were also generated.

[Series 4: l spine bone · axial · 0.27mm/px · z∈[-133,-63]mm · 2 of 86 slices shown, 3 images]
[im 29/86  soft-tissue]
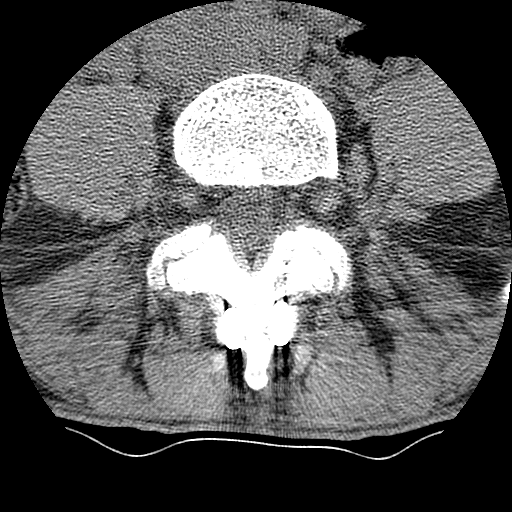
[im 29/86  bone]
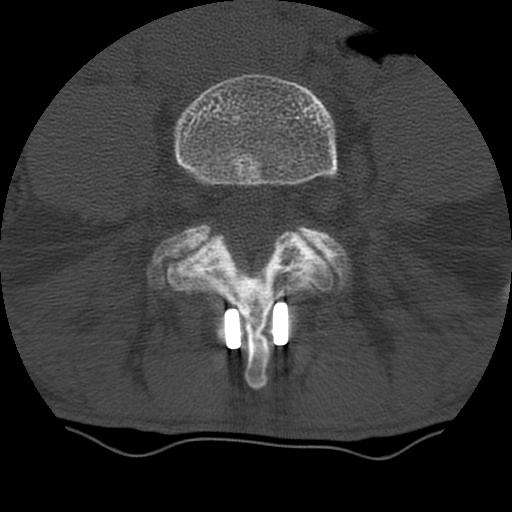
[im 57/86  bone]
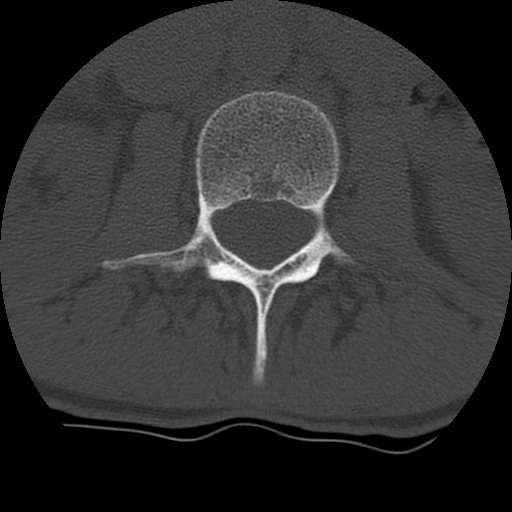

[Series 5: l spine detail · axial · 0.27mm/px · z∈[-133,-63]mm · 2 of 86 slices shown]
[im 29/86  bone]
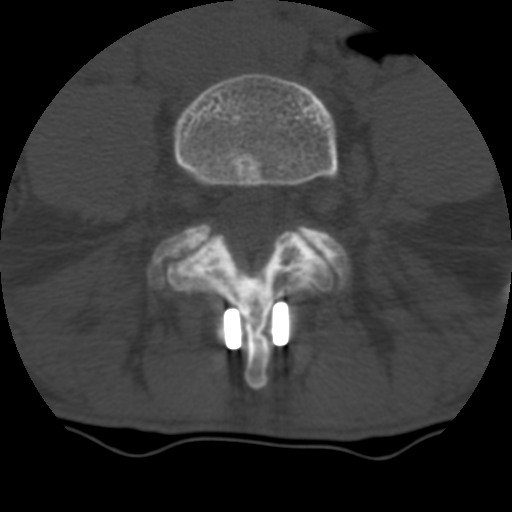
[im 57/86  bone]
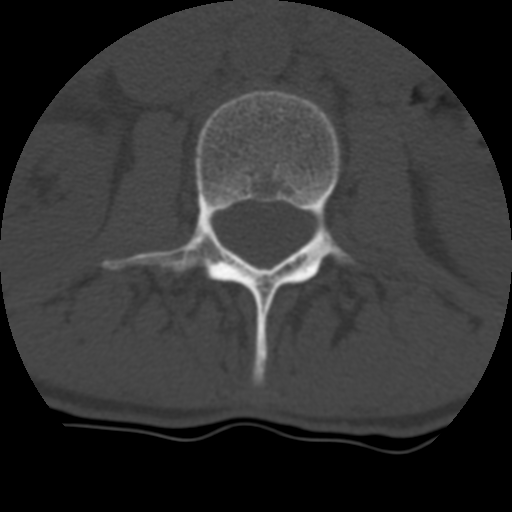

[Series 201: cor lower · coronal · 0.43mm/px · 1 of 55 slices shown]
[im 28/55  bone]
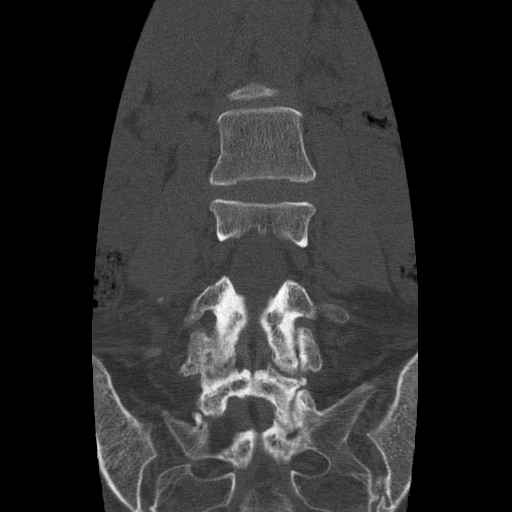

[Series 202: sag · sagittal · 0.43mm/px · 5 of 55 slices shown, 6 images]
[im 19/55  bone]
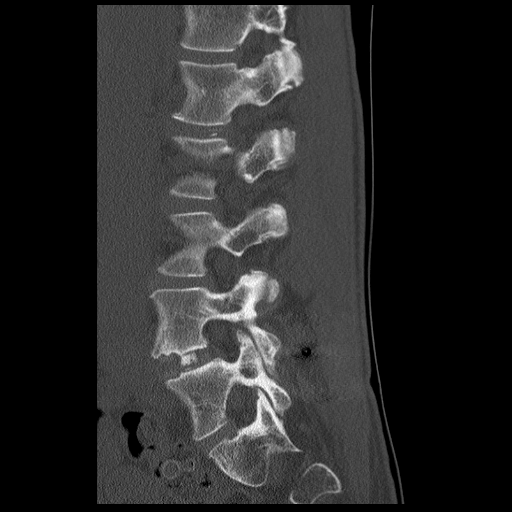
[im 23/55  bone]
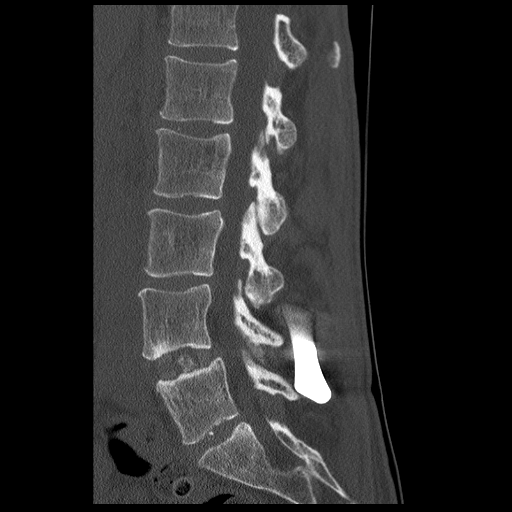
[im 28/55  soft-tissue]
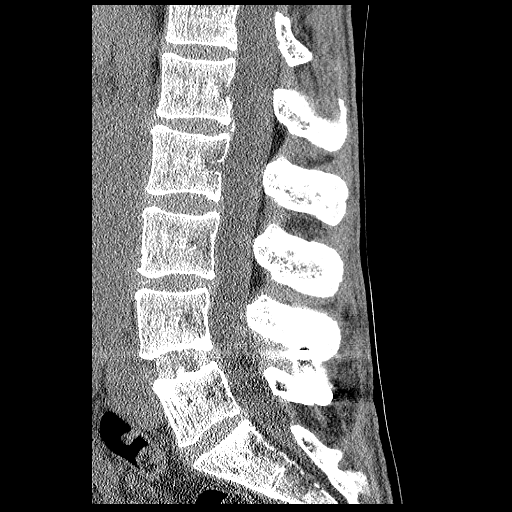
[im 28/55  bone]
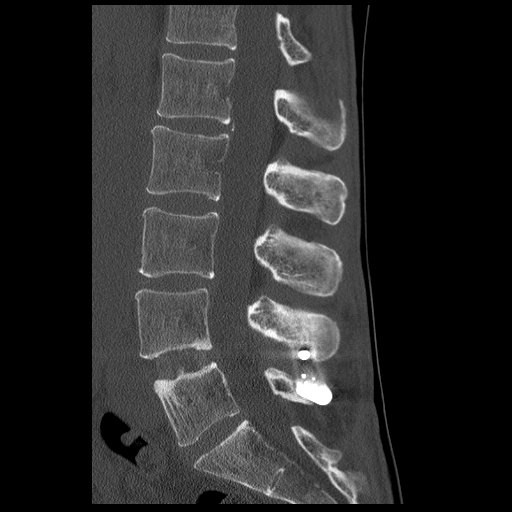
[im 32/55  bone]
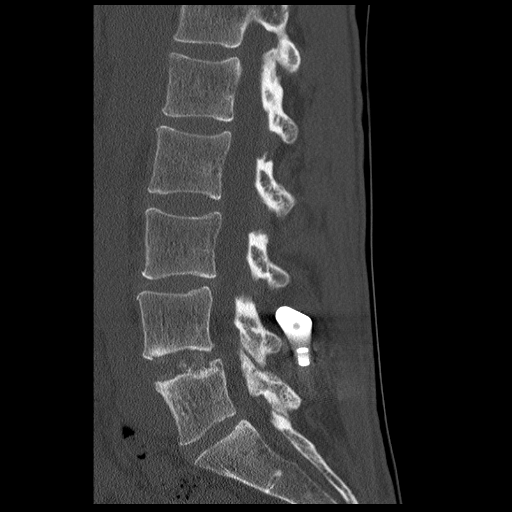
[im 37/55  bone]
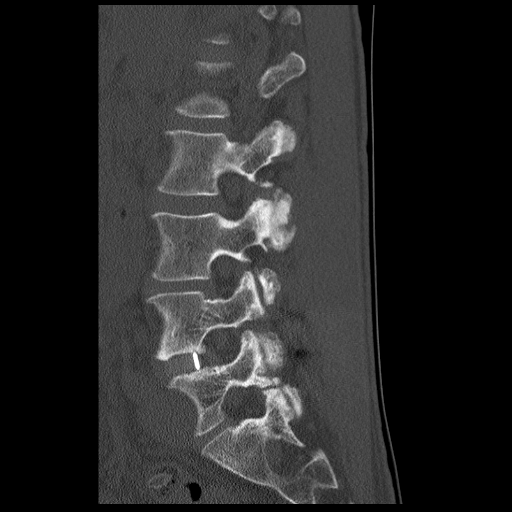

[10 of 33 positions shown; findings below may reference images not displayed]

FINDINGS: No fracture.  Negative for mass lesion.

L1-2:  Mild disc degeneration and mild disc bulging without
stenosis.  Early facet degeneration.

L2-3:  Mild disc and facet degeneration without stenosis.

L3-4:  Mild disc bulging and mild facet degeneration without
stenosis. Slight retrolisthesis is present.

L4-5:  X stop device on the spinous processes in good position.
Interbody spacer and bone graft material is in satisfactory
position.  No solid bony fusion is demonstrated at this level.

L5-S1:  Negative
IMPRESSION: At L4-5, the X stop device is in good position.  Interbody spacer
and  bone graft in place without solid bony fusion.

## 2011-10-07 ENCOUNTER — Other Ambulatory Visit: Payer: Self-pay | Admitting: Obstetrics and Gynecology

## 2011-10-07 DIAGNOSIS — Z1231 Encounter for screening mammogram for malignant neoplasm of breast: Secondary | ICD-10-CM

## 2011-10-18 ENCOUNTER — Other Ambulatory Visit (HOSPITAL_COMMUNITY): Payer: Self-pay | Admitting: Obstetrics and Gynecology

## 2011-10-18 ENCOUNTER — Ambulatory Visit (HOSPITAL_COMMUNITY)
Admission: RE | Admit: 2011-10-18 | Discharge: 2011-10-18 | Disposition: A | Discharge status: Home / Self Care | Payer: BC Managed Care – PPO | Source: Ambulatory Visit | Attending: Obstetrics and Gynecology | Admitting: Obstetrics and Gynecology

## 2011-10-18 DIAGNOSIS — R52 Pain, unspecified: Secondary | ICD-10-CM

## 2011-10-18 DIAGNOSIS — R0789 Other chest pain: Secondary | ICD-10-CM | POA: Insufficient documentation

## 2011-10-18 IMAGING — CR DG RIBS W/ CHEST 3+V*L*
3 series · 3 of 3 positions shown · non-contrast
Comparison: None.

CLINICAL DATA: Intermittent left mid anterior rib pain, no known
injury.

LEFT RIBS AND CHEST - 3+ VIEW

[w chest pa]
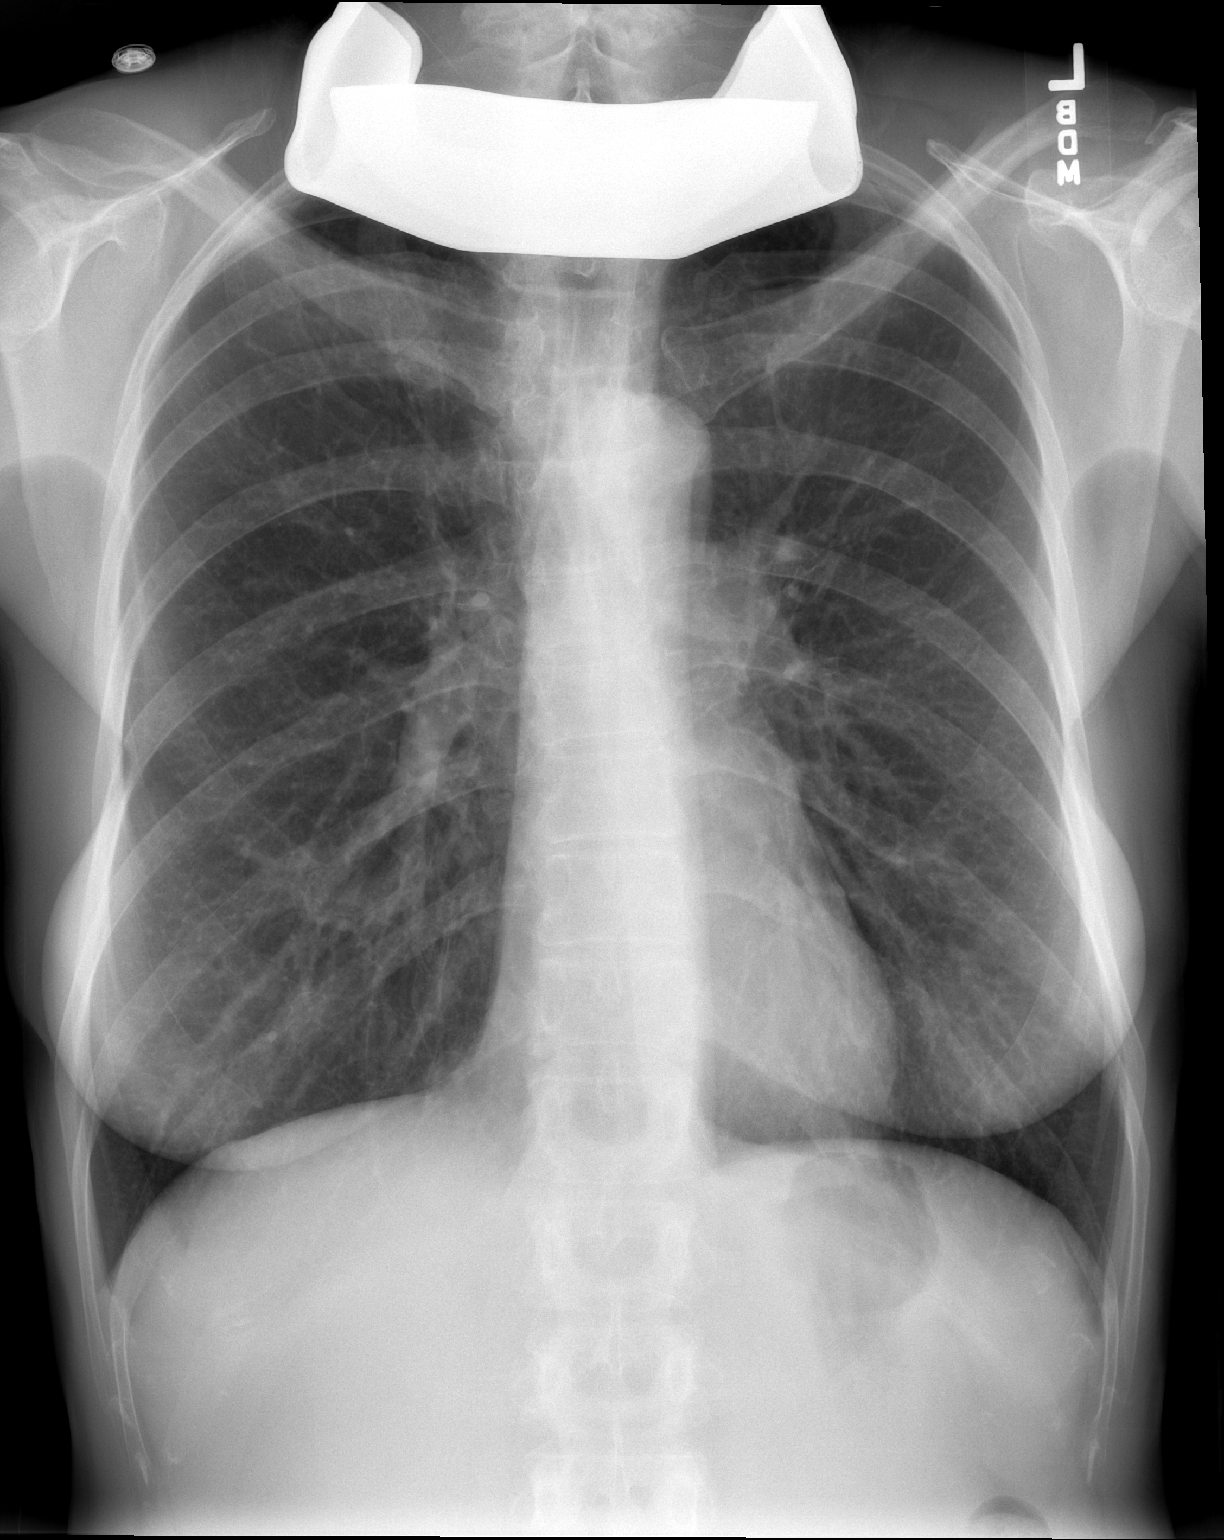

[w ribs ap/pa upper left]
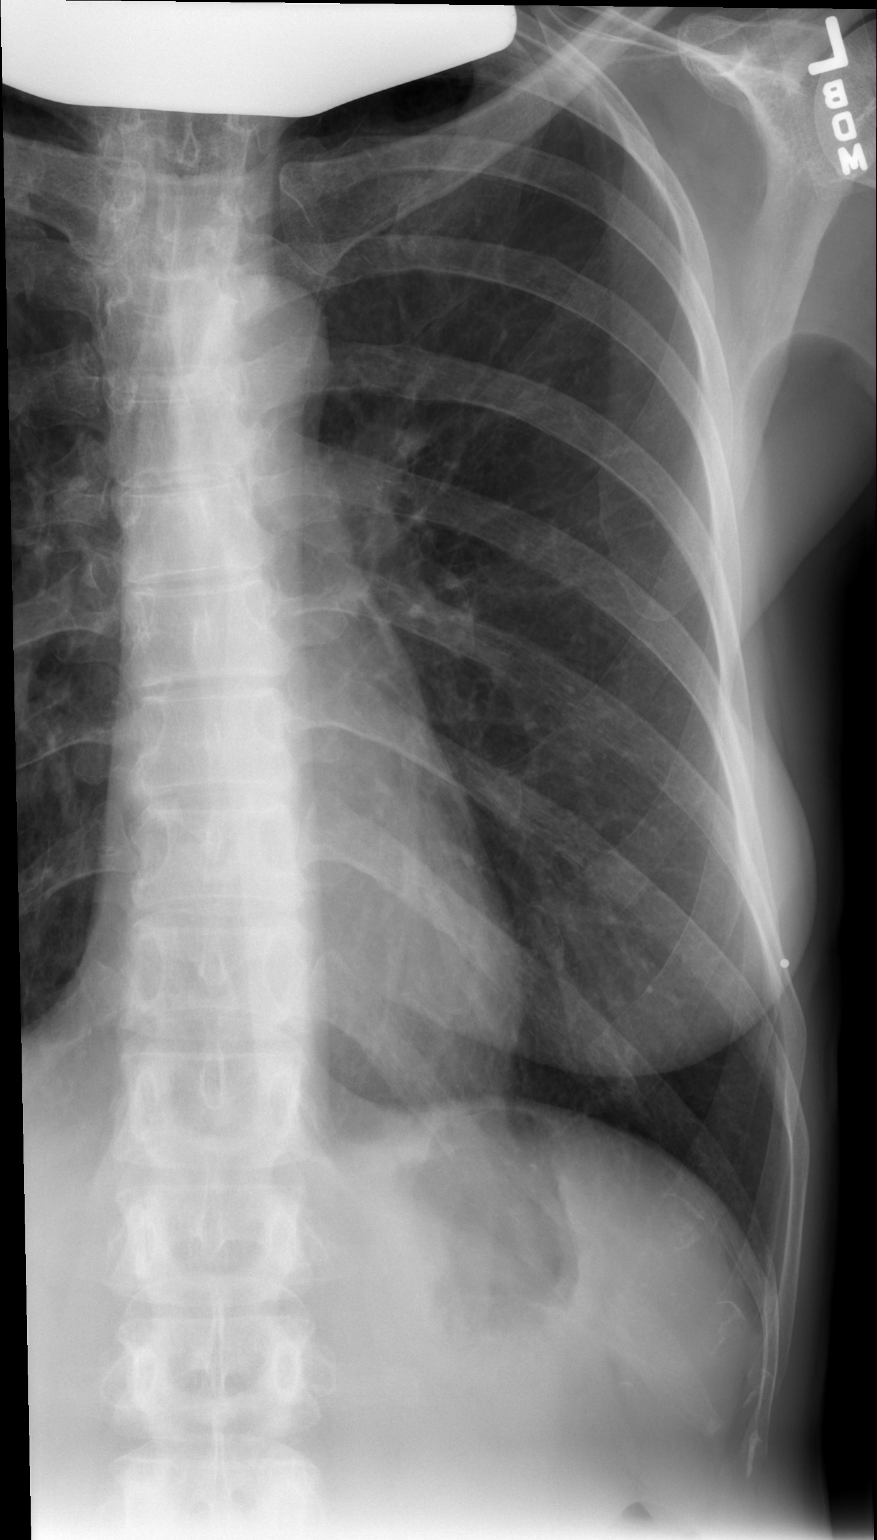

[w ribs ap/pa lower left]
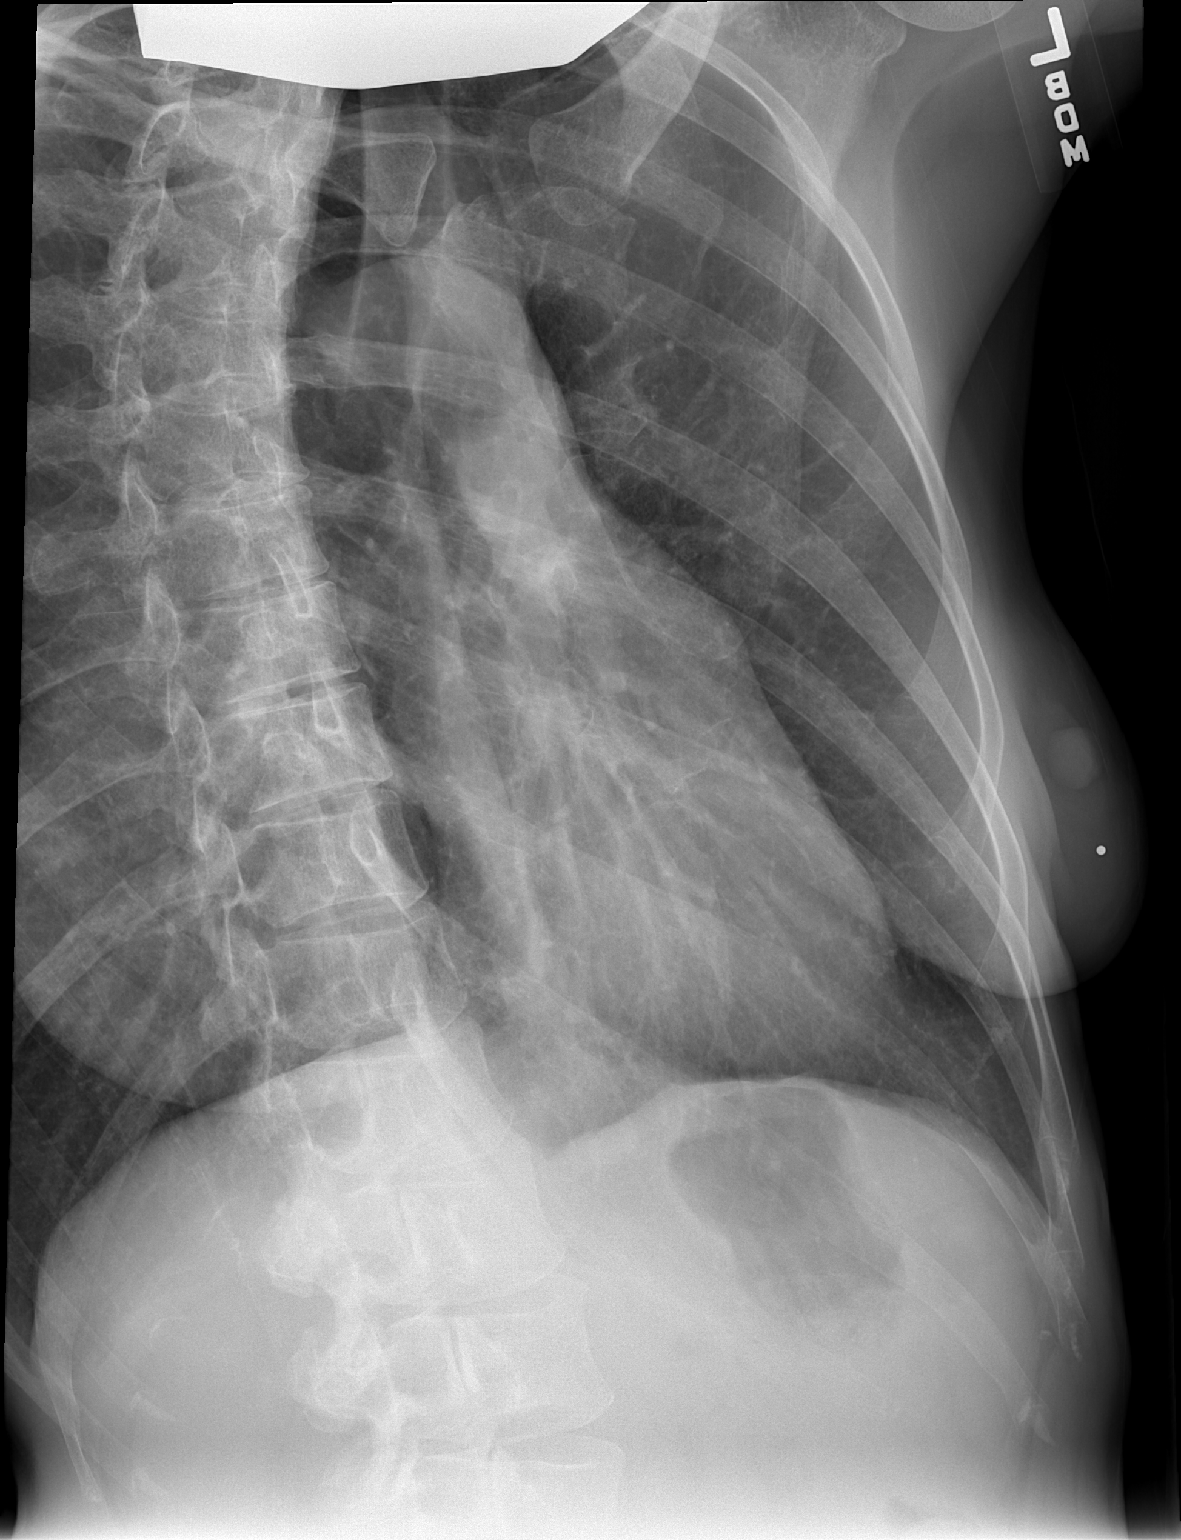

[3 of 3 positions shown; findings below may reference images not displayed]

FINDINGS: No cardiac silhouette and mediastinal contours.  There is
mild diffuse thickening of the pulmonary interstitium without focal
airspace opacity.  No definite pleural effusion or pneumothorax,
though note, the bilateral lung apices are excluded secondary to
overlying thyroid shield.

No displaced rib fracture or aggressive osseous abnormality with
special attention paid to the area demarcated by the radio-opaque
BB.
IMPRESSION: 1.  No acute cardiopulmonary disease.
2.  No displaced rib fractures or aggressive osseous abnormalities
with special attention paid to the area demarcated by the
radiopaque BB.

## 2011-11-20 ENCOUNTER — Ambulatory Visit
Admission: RE | Admit: 2011-11-20 | Discharge: 2011-11-20 | Disposition: A | Discharge status: Home / Self Care | Payer: BC Managed Care – PPO | Source: Ambulatory Visit | Attending: Obstetrics and Gynecology | Admitting: Obstetrics and Gynecology

## 2011-11-20 DIAGNOSIS — Z1231 Encounter for screening mammogram for malignant neoplasm of breast: Secondary | ICD-10-CM

## 2011-11-20 IMAGING — MG MM DIGITAL SCREENING BILAT
8 series · 9 of 24 positions shown · non-contrast
Comparison: Previous exams.

CLINICAL DATA: Screening.

DIGITAL BILATERAL SCREENING MAMMOGRAM WITH CAD
DIGITAL BREAST TOMOSYNTHESIS
Digital breast tomosynthesis images are acquired in two
projections.  These images are reviewed in combination with the
digital mammogram, confirming the findings below.

[R CC]
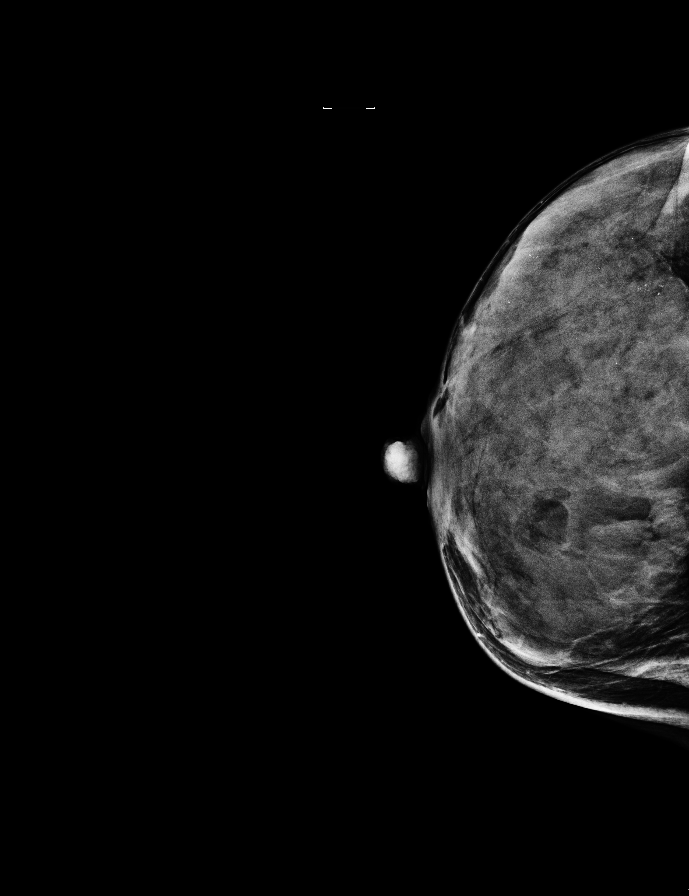

[L MLO]
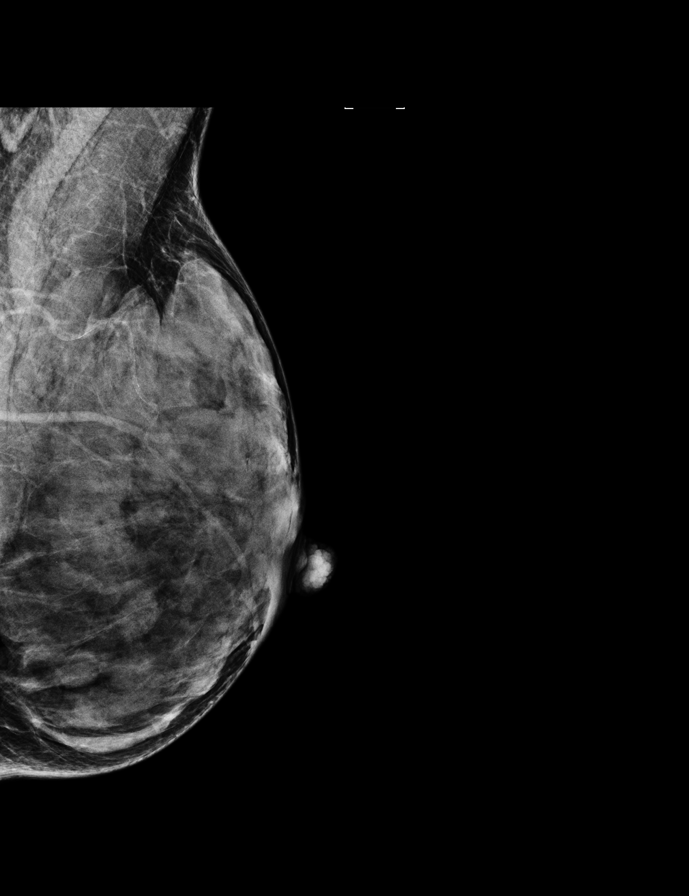

[R MLO]
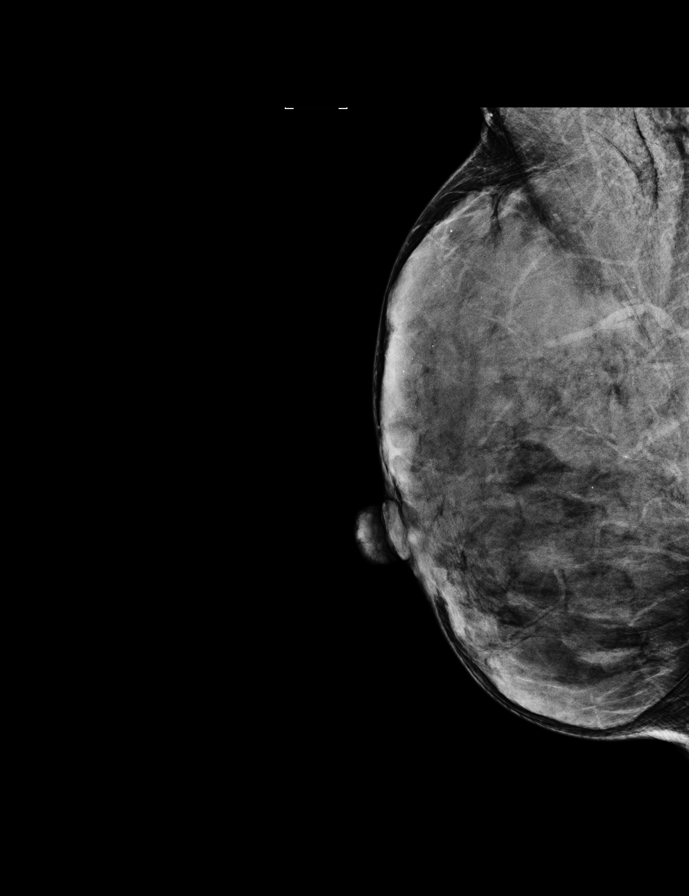

[L CC]
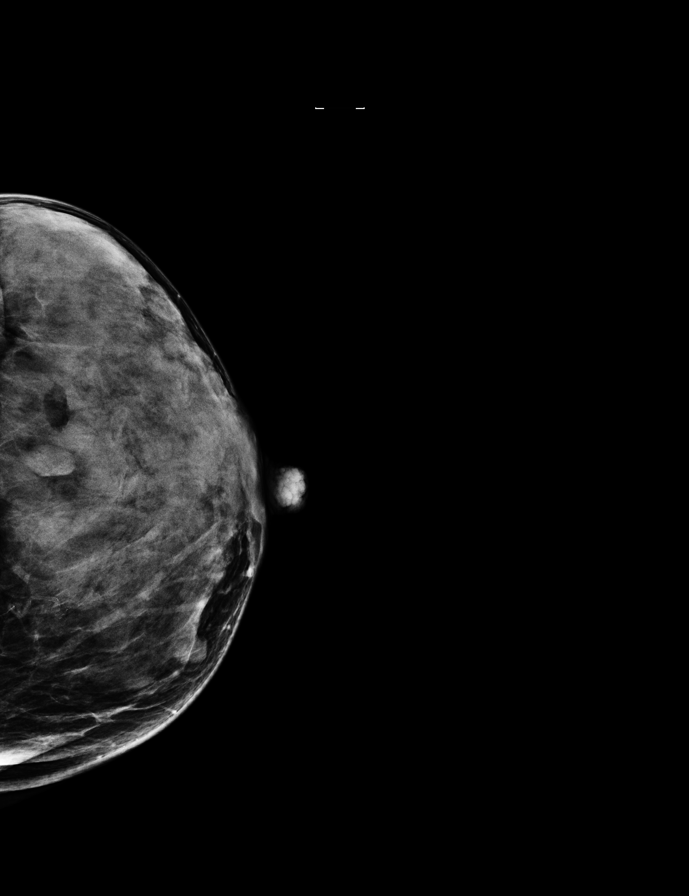

[LCC COMBO BREAST TOMOSYNTHESIS IMAGE tomo · 2 of 40 frames shown]
[frame 13/40]
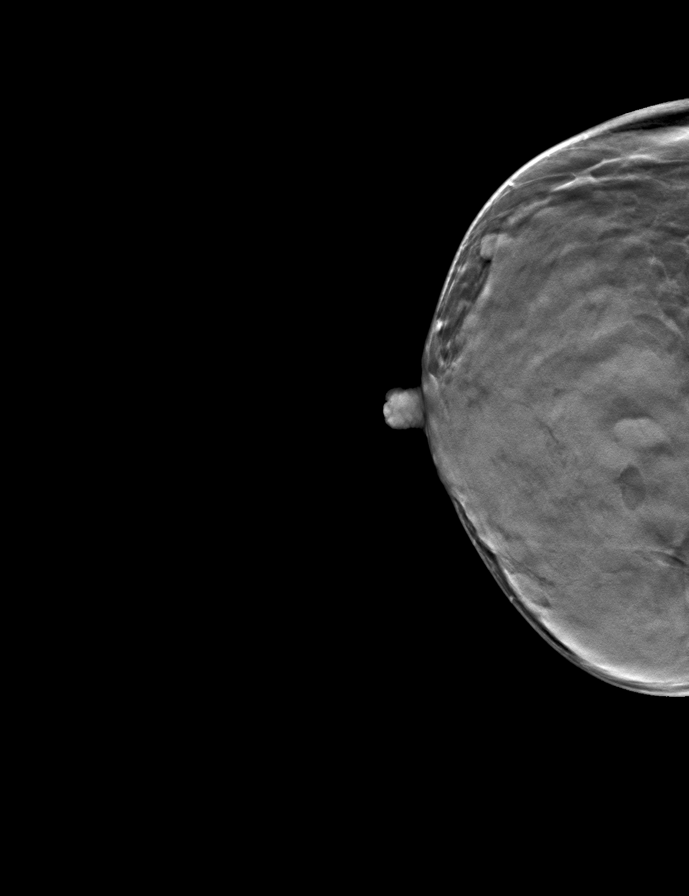
[frame 21/40]
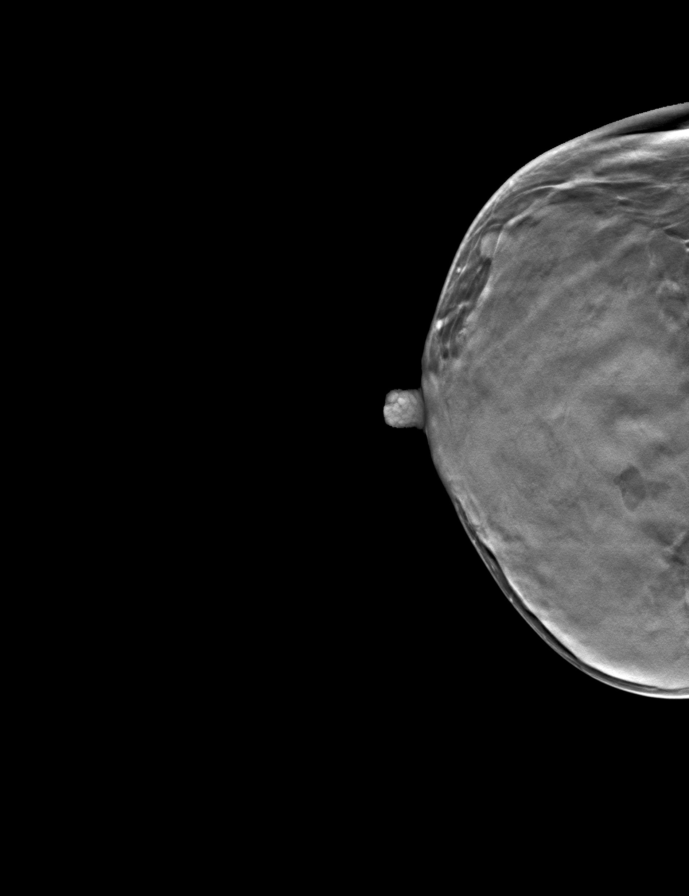

[LMLO COMBO BREAST TOMOSYNTHESIS IMAGE tomo · tomo slice 21/41.0]
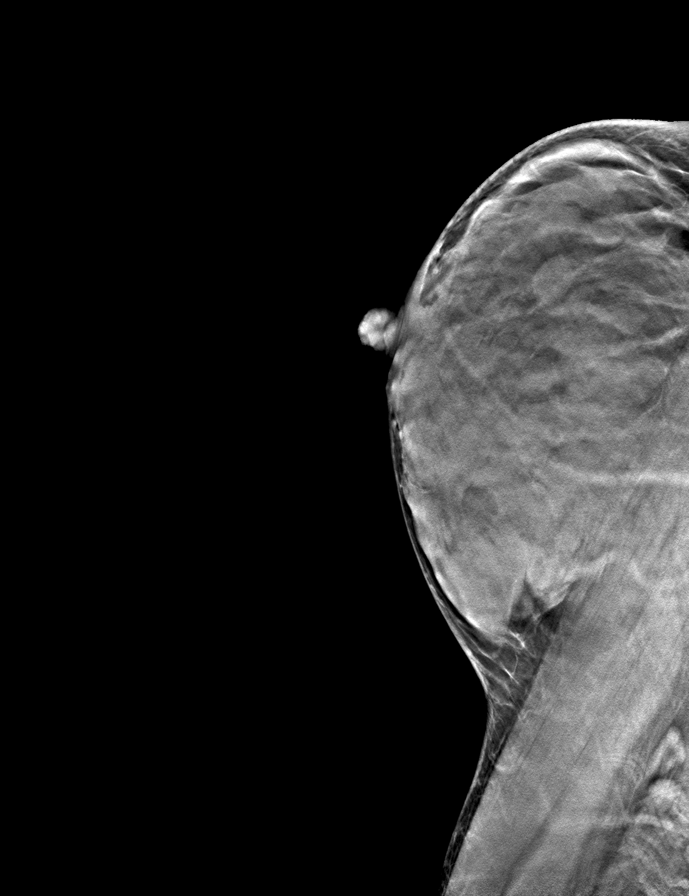

[RMLO COMBO BREAST TOMOSYNTHESIS IMAGE tomo · tomo slice 20/39.0]
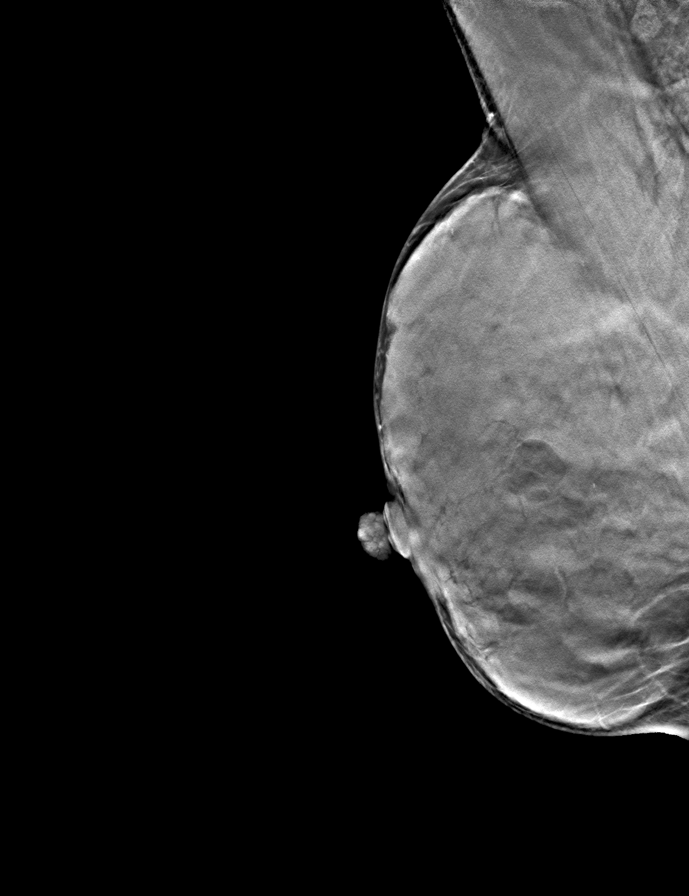

[RCC COMBO BREAST TOMOSYNTHESIS IMAGE tomo · tomo slice 21/42.0]
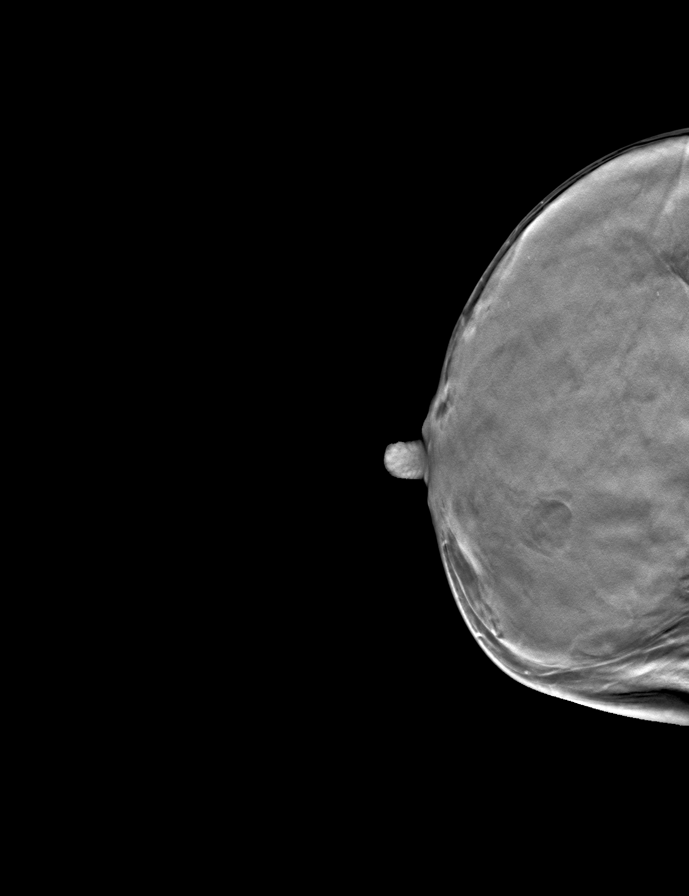

[9 of 24 positions shown; findings below may reference images not displayed]

FINDINGS: The breast tissue is heterogeneously dense. No
suspicious masses, architectural distortion, or calcifications are
present.

Images were processed with CAD.
IMPRESSION: No mammographic evidence of malignancy.

A result letter of this screening mammogram will be mailed directly
to the patient.

RECOMMENDATION:
Screening mammogram in one year. (Code:[BL])

BI-RADS CATEGORY 1:  Negative.

## 2012-10-14 ENCOUNTER — Other Ambulatory Visit: Payer: Self-pay

## 2012-10-14 DIAGNOSIS — Z1231 Encounter for screening mammogram for malignant neoplasm of breast: Secondary | ICD-10-CM

## 2012-12-04 ENCOUNTER — Ambulatory Visit
Admission: RE | Admit: 2012-12-04 | Discharge: 2012-12-04 | Disposition: A | Discharge status: Home / Self Care | Payer: BC Managed Care – PPO | Source: Ambulatory Visit

## 2012-12-04 DIAGNOSIS — Z1231 Encounter for screening mammogram for malignant neoplasm of breast: Secondary | ICD-10-CM

## 2012-12-04 IMAGING — MG STANDARD SCREENING - COMBO
8 series · 9 of 24 positions shown · non-contrast
Comparison: Previous exam(s).

CLINICAL DATA: Screening.

DIGITAL SCREENING BILATERAL MAMMOGRAM WITH CAD
DIGITAL BREAST TOMOSYNTHESIS
Digital breast tomosynthesis images are acquired in two
projections.  These images are reviewed in combination with the
digital mammogram, confirming the findings below.

[L MLO]
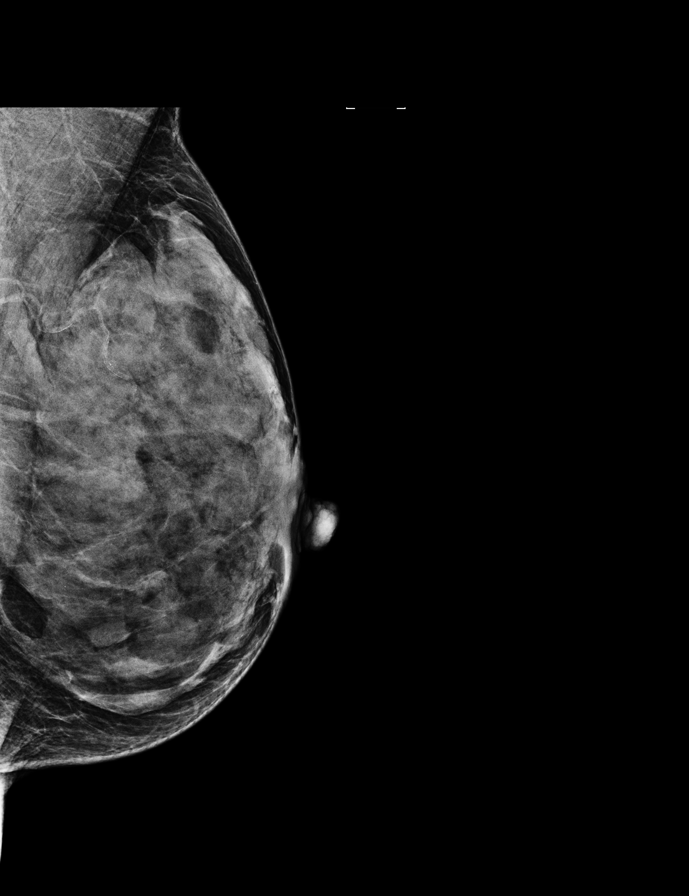

[L CC]
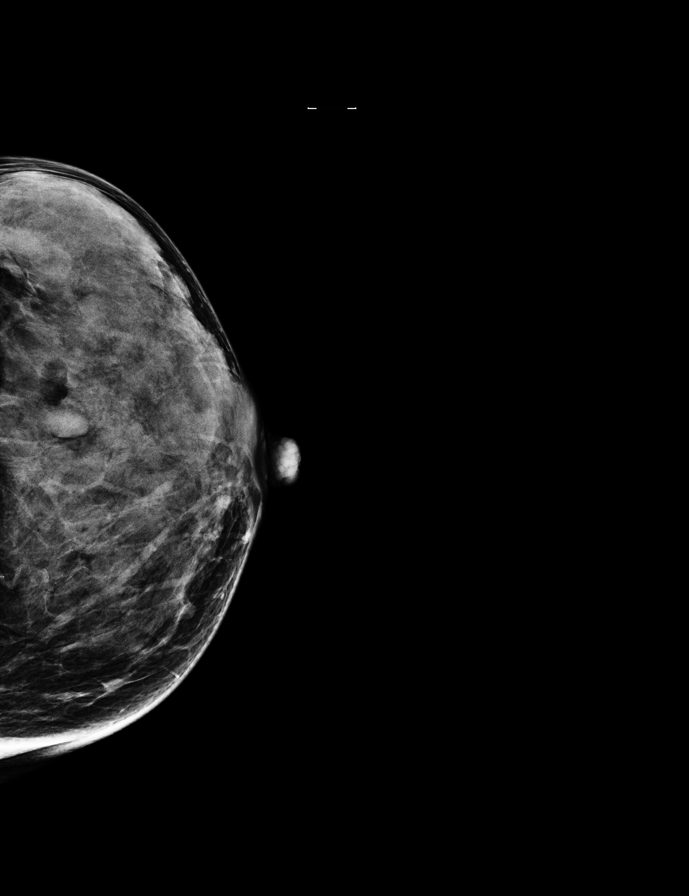

[R CC]
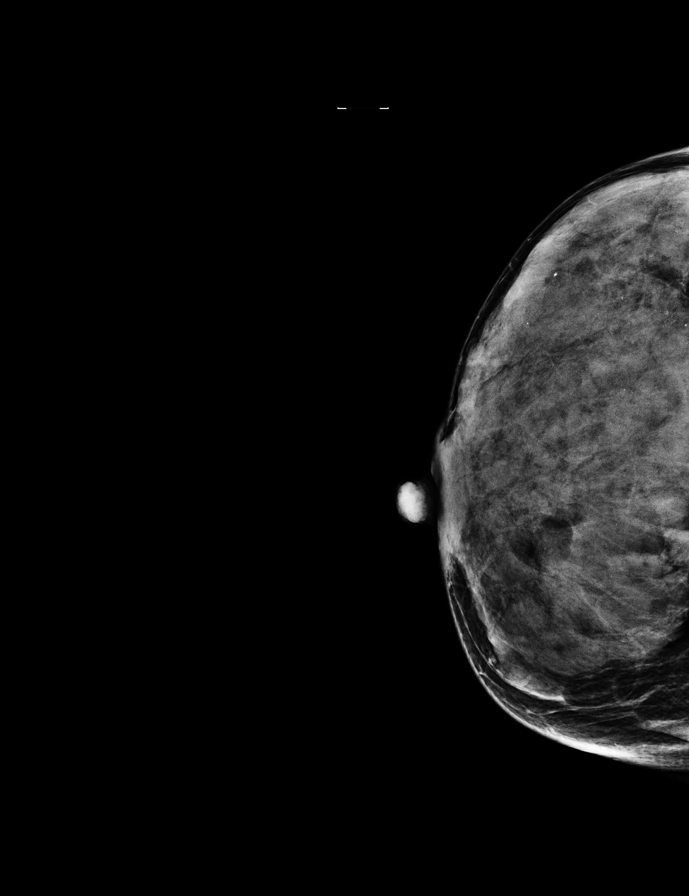

[R MLO]
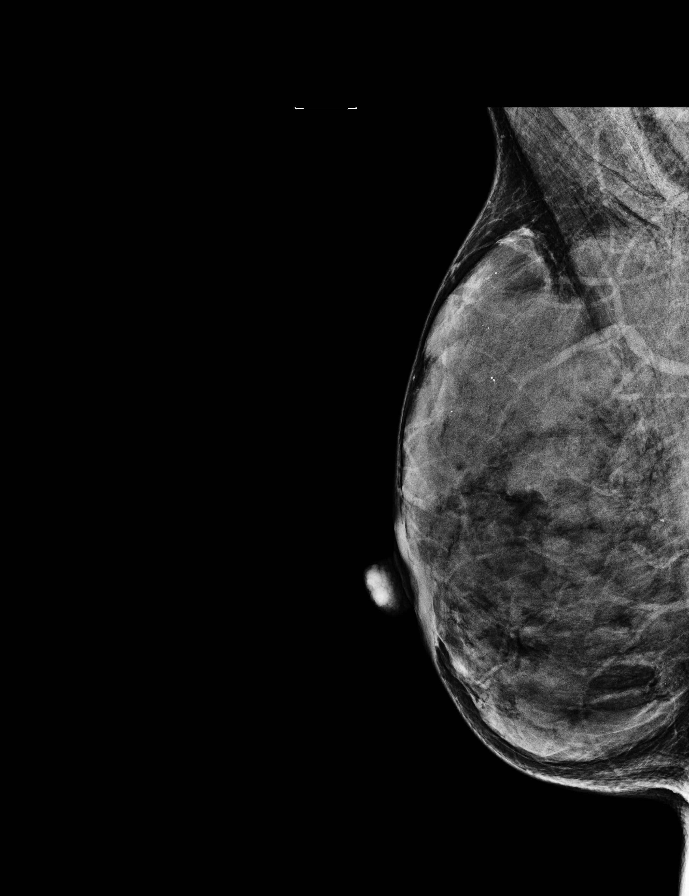

[R CC tomo · 2 of 38 frames shown]
[frame 13/38]
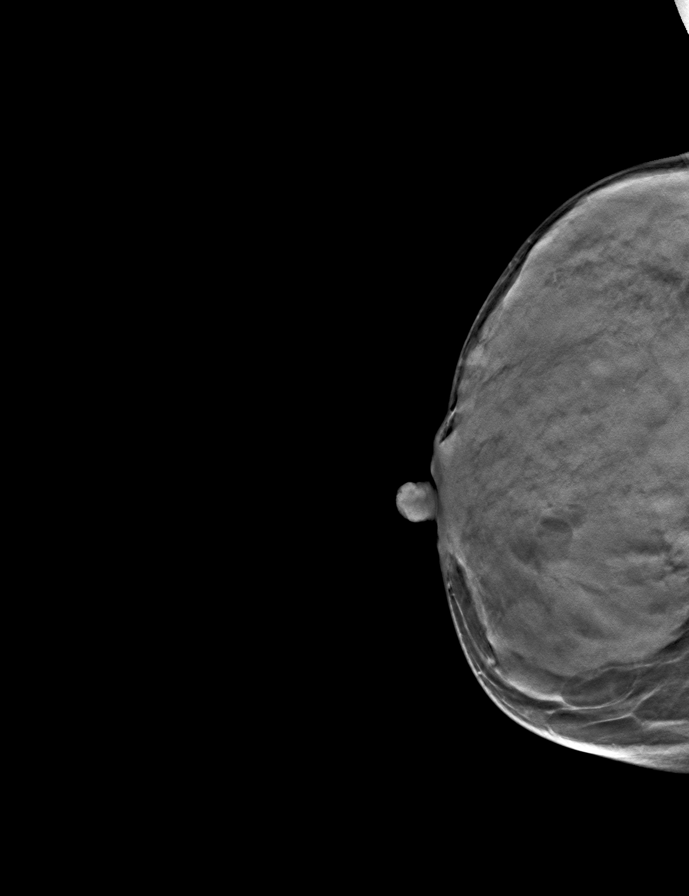
[frame 19/38]
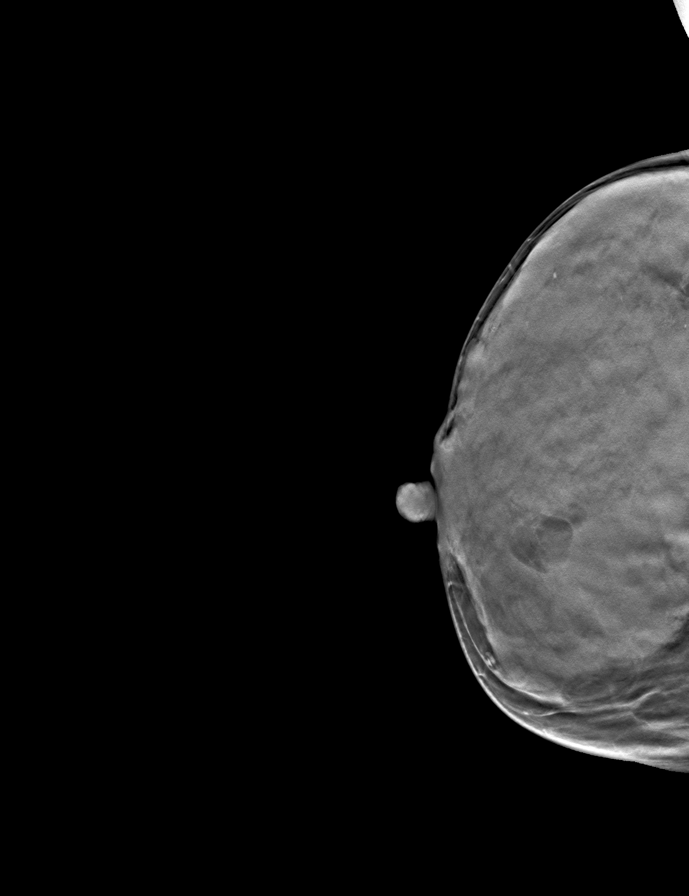

[L CC tomo · tomo slice 20/39.0]
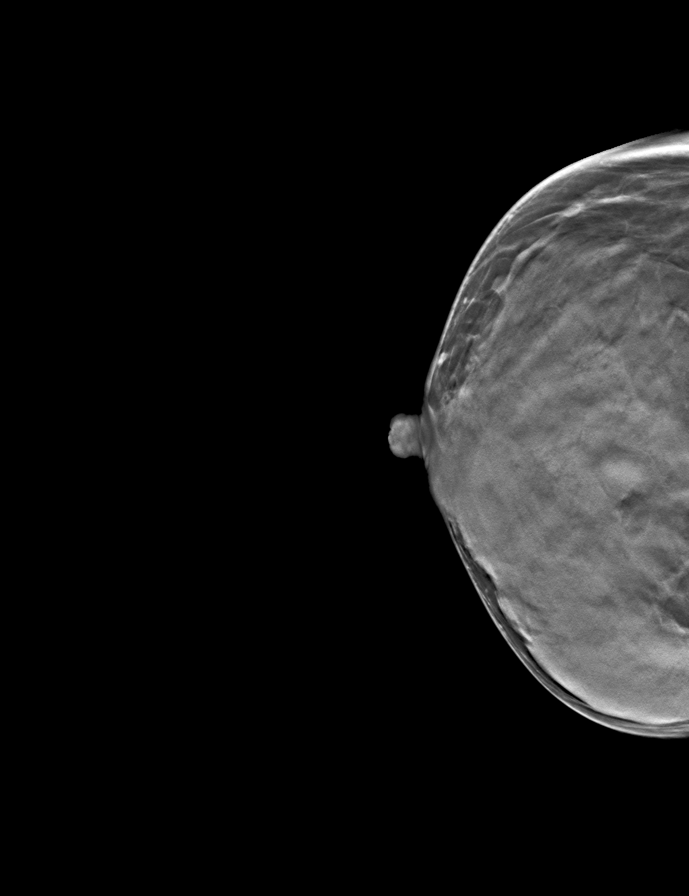

[R MLO tomo · tomo slice 21/40.0]
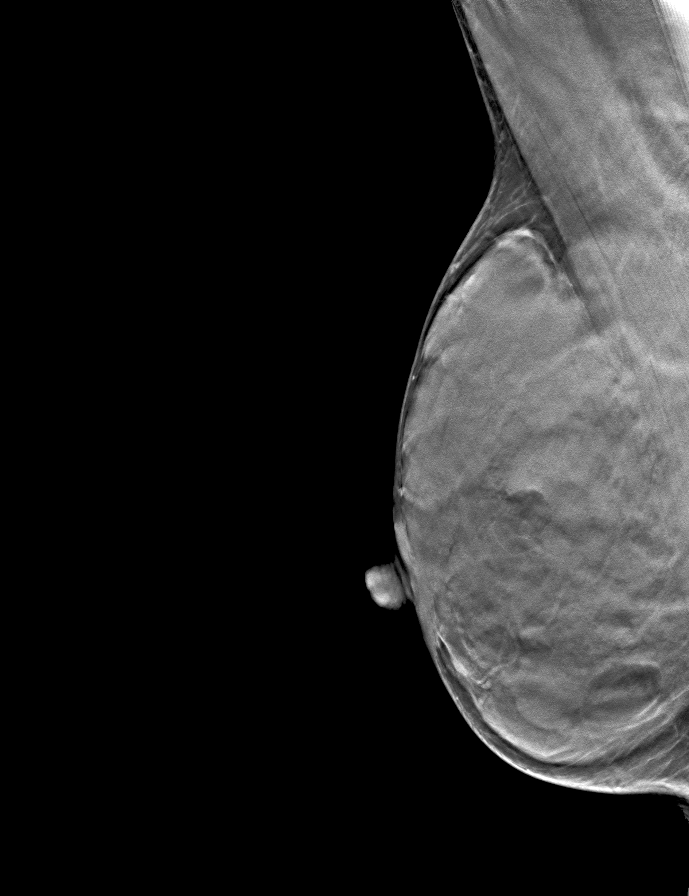

[L MLO tomo · tomo slice 21/40.0]
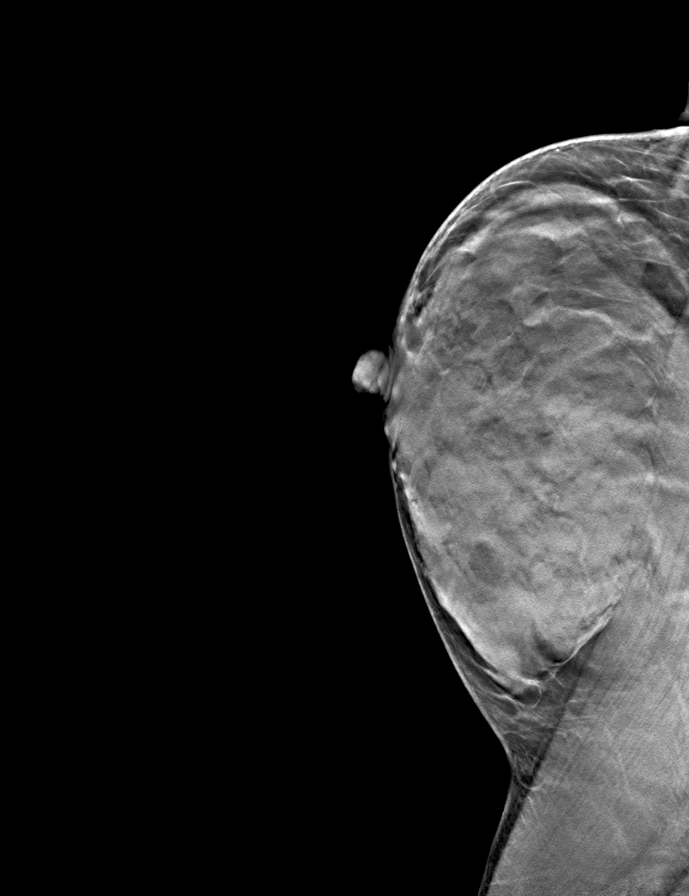

[9 of 24 positions shown; findings below may reference images not displayed]

FINDINGS: ACR Breast Density Category b:  There are scattered areas of
fibroglandular density.

In the right breast, calcifications warrant further evaluation with
magnified views.  In the left breast, there are no findings
suspicious for malignancy.

Images were processed with CAD.
IMPRESSION: Further evaluation is suggested for calcifications in the right
breast.

RECOMMENDATION:
Diagnostic mammogram of the right breast. (Code:[7E])

The patient will be contacted regarding the findings, and
additional imaging will be scheduled.

BI-RADS CATEGORY 0:  Incomplete.  Need additional imaging
evaluation and/or prior mammograms for comparison.

## 2012-12-08 ENCOUNTER — Other Ambulatory Visit: Payer: Self-pay | Admitting: Obstetrics and Gynecology

## 2012-12-08 DIAGNOSIS — R928 Other abnormal and inconclusive findings on diagnostic imaging of breast: Secondary | ICD-10-CM

## 2012-12-09 ENCOUNTER — Other Ambulatory Visit: Payer: Self-pay | Admitting: Neurosurgery

## 2012-12-09 DIAGNOSIS — M47817 Spondylosis without myelopathy or radiculopathy, lumbosacral region: Secondary | ICD-10-CM

## 2012-12-11 ENCOUNTER — Ambulatory Visit
Admission: RE | Admit: 2012-12-11 | Discharge: 2012-12-11 | Disposition: A | Discharge status: Home / Self Care | Payer: BC Managed Care – PPO | Source: Ambulatory Visit | Attending: Obstetrics and Gynecology | Admitting: Obstetrics and Gynecology

## 2012-12-11 ENCOUNTER — Other Ambulatory Visit: Payer: Self-pay | Admitting: Obstetrics and Gynecology

## 2012-12-11 DIAGNOSIS — R928 Other abnormal and inconclusive findings on diagnostic imaging of breast: Secondary | ICD-10-CM

## 2012-12-11 DIAGNOSIS — R921 Mammographic calcification found on diagnostic imaging of breast: Secondary | ICD-10-CM

## 2012-12-24 ENCOUNTER — Ambulatory Visit
Admission: RE | Admit: 2012-12-24 | Discharge: 2012-12-24 | Disposition: A | Discharge status: Home / Self Care | Payer: BC Managed Care – PPO | Source: Ambulatory Visit | Attending: Obstetrics and Gynecology | Admitting: Obstetrics and Gynecology

## 2012-12-24 DIAGNOSIS — R921 Mammographic calcification found on diagnostic imaging of breast: Secondary | ICD-10-CM

## 2012-12-28 ENCOUNTER — Encounter (INDEPENDENT_AMBULATORY_CARE_PROVIDER_SITE_OTHER): Payer: Self-pay | Admitting: Surgery

## 2012-12-28 ENCOUNTER — Ambulatory Visit (INDEPENDENT_AMBULATORY_CARE_PROVIDER_SITE_OTHER): Payer: BC Managed Care – PPO | Admitting: Surgery

## 2012-12-28 VITALS — BP 102/64 | HR 66 | Temp 98.2°F | Resp 16 | Ht 68.0 in | Wt 120.0 lb

## 2012-12-28 DIAGNOSIS — R920 Mammographic microcalcification found on diagnostic imaging of breast: Secondary | ICD-10-CM

## 2012-12-28 NOTE — Progress Notes (Signed)
Patient ID: Kaitlyn Lopez, female   DOB: 04-17-1960, 52 y.o.   MRN: [id number]  Chief Complaint  Patient presents with  . New Evaluation    eval Rt Br calcification    HPI Kaitlyn Lopez is a 52 y.o. female.  Patient sent at request of Dr. Edwin Cap of the breast [facility] for atypical right breast microcalcifications. Patient has a history of fibrocystic breast disease with biopsy of left breast in 1992. History of painful breasts with multiple lumps. Right breast calcifications noted this year on screening mammography and subsequent diagnostic mammograms in the deep right central breast. These are felt to be suspicious brought were too deep for  core biopsy. HPI  Past Medical History  Diagnosis Date  . Thyroid disease     Past Surgical History  Procedure Laterality Date  . Transforaminal lumbar interbody fusion w/ mis 4 level    . Breast surgery  1992    lumpectomy    Family History  Problem Relation Age of Onset  . Cancer Maternal Grandfather     breast    Social History History  Substance Use Topics  . Smoking status: Never Smoker   . Smokeless tobacco: Never Used  . Alcohol Use: Yes    Allergies  Allergen Reactions  . Celebrex [Celecoxib] Itching  . Codeine Nausea And Vomiting  . Percocet [Oxycodone-Acetaminophen] Nausea Only    Current Outpatient Prescriptions  Medication Sig Dispense Refill  . Ascorbic Acid (VITAMIN C) 1000 MG tablet Take 1,000 mg by mouth daily.      . cholecalciferol (VITAMIN D) 1000 UNITS tablet Take 1,000 Units by mouth daily.      . DULoxetine (CYMBALTA) 20 MG capsule Take 20 mg by mouth daily.      Marland Kitchen estrogen, conjugated,-medroxyprogesterone (PREMPRO) 0.45-1.5 MG per tablet Take 1 tablet by mouth daily.      . ferrous fumarate (HEMOCYTE - 106 MG FE) 325 (106 FE) MG TABS tablet Take 1 tablet by mouth.      . Thiamine HCl (VITAMIN B-1) 250 MG tablet Take 250 mg by mouth daily.      Marland Kitchen thyroid (ARMOUR) 120 MG tablet Take 120  mg by mouth daily before breakfast.       No current facility-administered medications for this visit.    Review of Systems Review of Systems  Constitutional: Negative for fever, chills and unexpected weight change.  HENT: Negative for hearing loss, congestion, sore throat, trouble swallowing and voice change.   Eyes: Negative for visual disturbance.  Respiratory: Negative for cough and wheezing.   Cardiovascular: Negative for chest pain, palpitations and leg swelling.  Gastrointestinal: Negative for nausea, vomiting, abdominal pain, diarrhea, constipation, blood in stool, abdominal distention and anal bleeding.  Genitourinary: Negative for hematuria, vaginal bleeding and difficulty urinating.  Musculoskeletal: Negative for arthralgias.  Skin: Negative for rash and wound.  Neurological: Negative for seizures, syncope and headaches.  Hematological: Negative for adenopathy. Does not bruise/bleed easily.  Psychiatric/Behavioral: Negative for confusion.    Blood pressure 102/64, pulse 66, temperature 98.2 F (36.8 C), temperature source Temporal, resp. rate 16, height 5\' 8"  (1.727 m), weight 120 lb (54.432 kg).  Physical Exam Physical Exam  Constitutional: She is oriented to person, place, and time. She appears well-developed and well-nourished.  HENT:  Head: Normocephalic and atraumatic.  Eyes: EOM are normal. Pupils are equal, round, and reactive to light.  Neck: Normal range of motion. Neck supple.  Cardiovascular: Normal rate and regular rhythm.   Pulmonary/Chest: Effort  normal and breath sounds normal. Right breast exhibits tenderness. Right breast exhibits no inverted nipple and no nipple discharge. Left breast exhibits tenderness. Left breast exhibits no inverted nipple and no nipple discharge.    Musculoskeletal: Normal range of motion.  Neurological: She is alert and oriented to person, place, and time.  Skin: Skin is warm and dry.  Psychiatric: She has a normal mood and  affect. Her behavior is normal. Judgment and thought content normal.    Data ACR Breast Density Category d: The breast tissue is extremely dense.  FINDINGS:  There are grouped amorphous calcifications in the outer slightly  lower right breast at the approximate 8 o'clock position spanning a  distance of 7 mm. This is confirmed on the additional spot  compression CC and MLO views.  CAD  IMPRESSION:  Suspicious right breast calcifications.  RECOMMENDATION:  Stereotactic guided biopsy of the suspicious calcifications in the  right breast. This is scheduled for Thursday October 2nd 2014 3 p.m.  It has been explained to the patient that these calcifications are  in a posterior location and may be difficult to reach via  stereotactic guided biopsy, and there is a chance that biopsy may be  unsuccessful. The patient demonstrates understanding.  I have discussed the findings and recommendations with the patient.  Results were also provided in writing at the conclusion of the  visit. If applicable, a reminder letter will be sent to the patient  regarding the next appointment.  BI-RADS CATEGORY 4: Suspicious abnormality - biopsy should be  considered.  Electronically Signed  By: Edwin Cap M.D.  On: 12/11/2012 12:21    Assessment    Atypical right breast microcalcifications central and deep with history of fibrocystic breast disease    Plan    Discussed operative removal of these calcifications with right breast needle localized lumpectomy versus observation. Risks, benefits and alternative therapies discussed. She would like to proceed with surgical removal.The procedure has been discussed with the patient. Alternatives to surgery have been discussed with the patient.  Risks of surgery include bleeding,  Infection,  Seroma formation, death,  and the need for further surgery.   The patient understands and wishes to proceed.       Kaitlyn Lopez A. 12/28/2012, 10:43 AM

## 2012-12-28 NOTE — Patient Instructions (Signed)
Lumpectomy, Breast Conserving Surgery A lumpectomy is breast surgery that removes only part of the breast. Another name used may be partial mastectomy. The amount removed varies. Make sure you understand how much of your breast will be removed. Reasons for a lumpectomy:  Any solid breast mass.  Grouped significant nodularity that may be confused with a solitary breast mass. Lumpectomy is the most common form of breast cancer surgery today. The surgeon removes the portion of your breast which contains the tumor (cancer). This is the lump. Some normal tissue around the lump is also removed to be sure that all the tumor has been removed.  If cancer cells are found in the margins where the breast tissue was removed, your surgeon will do more surgery to remove the remaining cancer tissue. This is called re-excision surgery. Radiation and/or chemotherapy treatments are often given following a lumpectomy to kill any cancer cells that could possibly remain.  REASONS YOU MAY NOT BE ABLE TO HAVE BREAST CONSERVING SURGERY:  The tumor is located in more than one place.  Your breast is small and the tumor is large so the breast would be disfigured.  The entire tumor removal is not successful with a lumpectomy.  You cannot commit to a full course of chemotherapy, radiation therapy or are pregnant and cannot have radiation.  You have previously had radiation to the breast to treat cancer. HOW A LUMPECTOMY IS PERFORMED If overnight nursing is not required following a biopsy, a lumpectomy can be performed as a same-day surgery. This can be done in a hospital, clinic, or surgical center. The anesthesia used will depend on your surgeon. They will discuss this with you. A general anesthetic keeps you sleeping through the procedure. LET YOUR CAREGIVERS KNOW ABOUT THE FOLLOWING:  Allergies  Medications taken including herbs, eye drops, over the counter medications, and creams.  Use of steroids (by mouth or  creams)  Previous problems with anesthetics or Novocaine.  Possibility of pregnancy, if this applies  History of blood clots (thrombophlebitis)  History of bleeding or blood problems.  Previous surgery  Other health problems BEFORE THE PROCEDURE You should be present one hour prior to your procedure unless directed otherwise.  AFTER THE PROCEDURE  After surgery, you will be taken to the recovery area where a nurse will watch and check your progress. Once you're awake, stable, and taking fluids well, barring other problems you will be allowed to go home.  Ice packs applied to your operative site may help with discomfort and keep the swelling down.  A small rubber drain may be placed in the breast for a couple of days to prevent a hematoma from developing in the breast.  A pressure dressing may be applied for 24 to 48 hours to prevent bleeding.  Keep the wound dry.  You may resume a normal diet and activities as directed. Avoid strenuous activities affecting the arm on the side of the biopsy site such as tennis, swimming, heavy lifting (more than 10 pounds) or pulling.  Bruising in the breast is normal following this procedure.  Wearing a bra - even to bed - may be more comfortable and also help keep the dressing on.  Change dressings as directed.  Only take over-the-counter or prescription medicines for pain, discomfort, or fever as directed by your caregiver. Call for your results as instructed by your surgeon. Remember it is your responsibility to get the results of your lumpectomy if your surgeon asked you to follow-up. Do not assume   everything is fine if you have not heard from your caregiver. SEEK MEDICAL CARE IF:   There is increased bleeding (more than a small spot) from the wound.  You notice redness, swelling, or increasing pain in the wound.  Pus is coming from wound.  An unexplained oral temperature above 102 F (38.9 C) develops.  You notice a foul smell  coming from the wound or dressing. SEEK IMMEDIATE MEDICAL CARE IF:   You develop a rash.  You have difficulty breathing.  You have any allergic problems. Document Released: 04/22/2006 Document Revised: 06/03/2011 Document Reviewed: 07/24/2006 [employer] Patient Information 2014 [employer].  

## 2013-01-04 ENCOUNTER — Other Ambulatory Visit: Payer: BC Managed Care – PPO

## 2013-01-05 ENCOUNTER — Encounter (HOSPITAL_BASED_OUTPATIENT_CLINIC_OR_DEPARTMENT_OTHER): Payer: Self-pay | Admitting: *Deleted

## 2013-01-05 ENCOUNTER — Ambulatory Visit
Admission: RE | Admit: 2013-01-05 | Discharge: 2013-01-05 | Disposition: A | Discharge status: Home / Self Care | Payer: BC Managed Care – PPO | Source: Ambulatory Visit | Attending: Neurosurgery | Admitting: Neurosurgery

## 2013-01-05 ENCOUNTER — Encounter (HOSPITAL_BASED_OUTPATIENT_CLINIC_OR_DEPARTMENT_OTHER)
Admission: RE | Admit: 2013-01-05 | Discharge: 2013-01-05 | Disposition: A | Discharge status: Home / Self Care | Payer: BC Managed Care – PPO | Source: Ambulatory Visit | Attending: Surgery | Admitting: Surgery

## 2013-01-05 DIAGNOSIS — M47817 Spondylosis without myelopathy or radiculopathy, lumbosacral region: Secondary | ICD-10-CM

## 2013-01-05 LAB — CBC WITH DIFFERENTIAL/PLATELET
Basophils Absolute: 0 10*3/uL (ref 0.0–0.1)
Basophils Relative: 0 % (ref 0–1)
Eosinophils Absolute: 0.1 10*3/uL (ref 0.0–0.7)
Eosinophils Relative: 2 % (ref 0–5)
HCT: 39.7 % (ref 36.0–46.0)
Hemoglobin: 13.9 g/dL (ref 12.0–15.0)
Lymphocytes Relative: 29 % (ref 12–46)
Lymphs Abs: 1.8 10*3/uL (ref 0.7–4.0)
MCH: 31.8 pg (ref 26.0–34.0)
MCHC: 35 g/dL (ref 30.0–36.0)
MCV: 90.8 fL (ref 78.0–100.0)
Monocytes Absolute: 0.8 10*3/uL (ref 0.1–1.0)
Monocytes Relative: 13 % — ABNORMAL HIGH (ref 3–12)
Neutro Abs: 3.4 10*3/uL (ref 1.7–7.7)
Neutrophils Relative %: 56 % (ref 43–77)
Platelets: 202 10*3/uL (ref 150–400)
RBC: 4.37 MIL/uL (ref 3.87–5.11)
RDW: 12.4 % (ref 11.5–15.5)
WBC: 6 10*3/uL (ref 4.0–10.5)

## 2013-01-05 LAB — COMPREHENSIVE METABOLIC PANEL
ALT: 27 U/L (ref 0–35)
AST: 27 U/L (ref 0–37)
Albumin: 3.9 g/dL (ref 3.5–5.2)
Alkaline Phosphatase: 96 U/L (ref 39–117)
BUN: 20 mg/dL (ref 6–23)
CO2: 30 mEq/L (ref 19–32)
Calcium: 9.2 mg/dL (ref 8.4–10.5)
Chloride: 101 mEq/L (ref 96–112)
Creatinine, Ser: 0.74 mg/dL (ref 0.50–1.10)
GFR calc Af Amer: >90 mL/min (ref >90)
GFR calc non Af Amer: >90 mL/min (ref >90)
Glucose, Bld: 62 mg/dL — ABNORMAL LOW (ref 70–99)
Potassium: 4.3 mEq/L (ref 3.5–5.1)
Sodium: 139 mEq/L (ref 135–145)
Total Bilirubin: 0.4 mg/dL (ref 0.3–1.2)
Total Protein: 6.9 g/dL (ref 6.0–8.3)

## 2013-01-05 IMAGING — CT CT L SPINE W/O CM
4 of 9 series · 13 of 33 positions shown, 15 images · non-contrast
Comparison: Lumbar MRI [DATE] and CT [DATE].

CLINICAL DATA: Lumbar spondylosis with bilateral leg pain. Previous
lumbar fusion [09].

EXAM:
CT LUMBAR SPINE WITHOUT CONTRAST
TECHNIQUE: Multidetector CT imaging of the lumbar spine was performed without
intravenous contrast administration. Multiplanar CT image
reconstructions were also generated.

[Series 4: l spine bone · axial · 0.27mm/px · z∈[-14,+62]mm · 2 of 90 slices shown]
[im 30/90  bone]
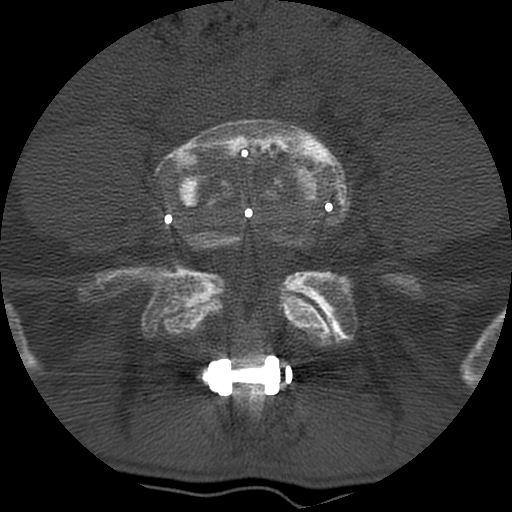
[im 60/90  bone]
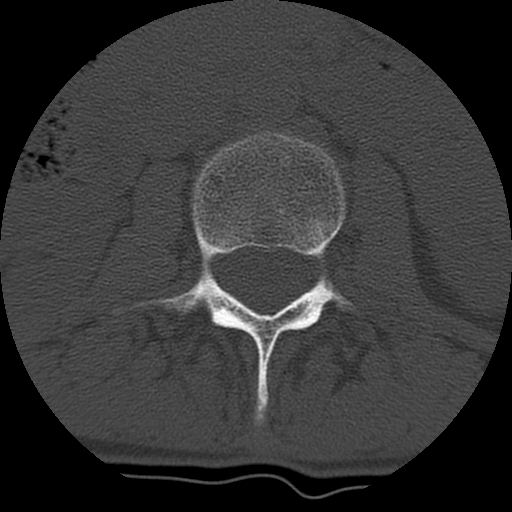

[Series 5: l spine detail · axial · 0.27mm/px · z∈[-31,+79]mm · 3 of 90 slices shown, 4 images]
[im 23/90  soft-tissue]
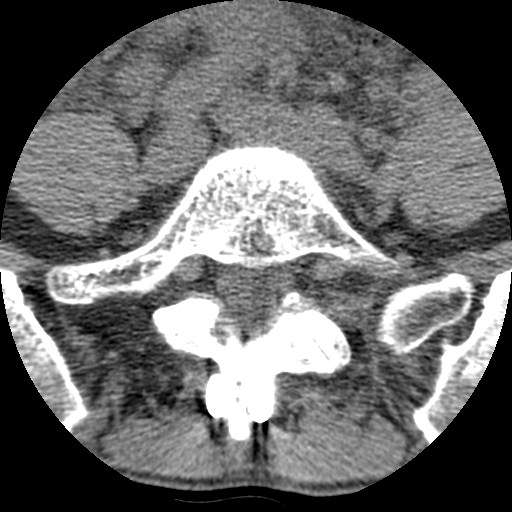
[im 23/90  bone]
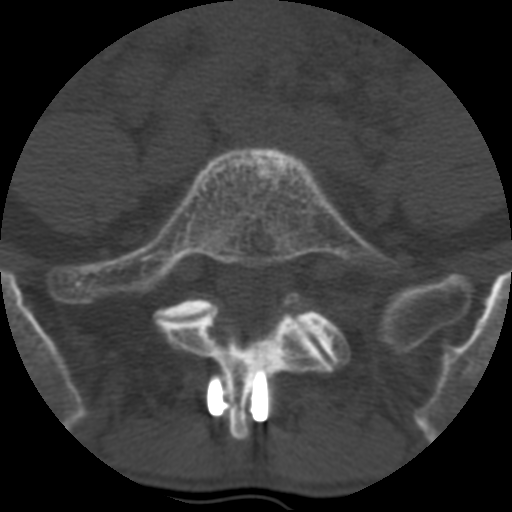
[im 45/90  bone]
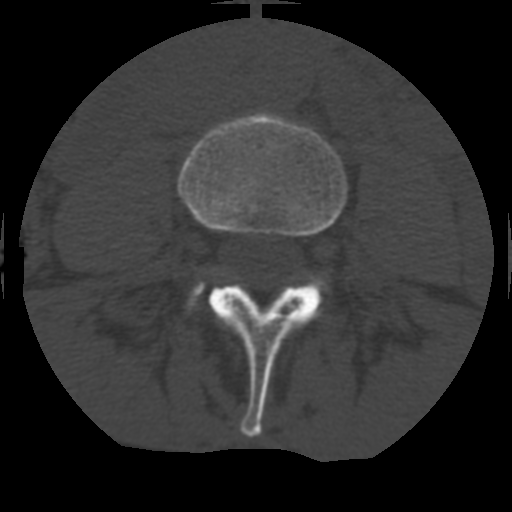
[im 67/90  bone]
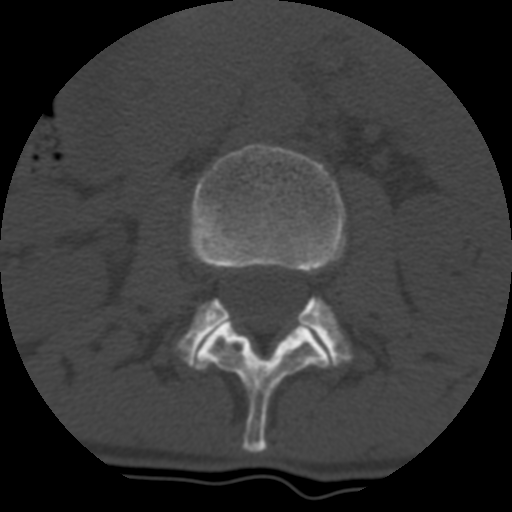

[Series 200: coronal · coronal · 0.45mm/px · 3 of 55 slices shown]
[im 11/55  bone]
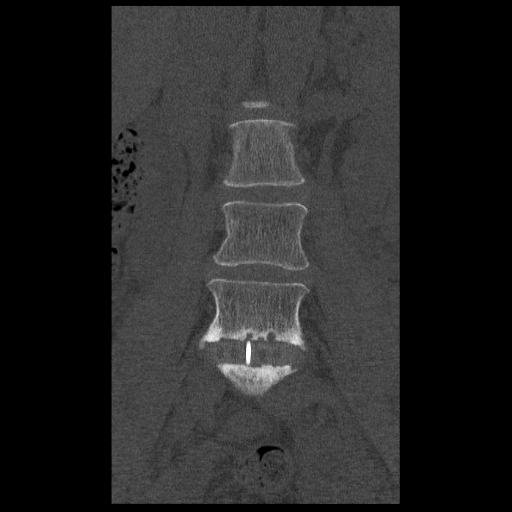
[im 22/55  bone]
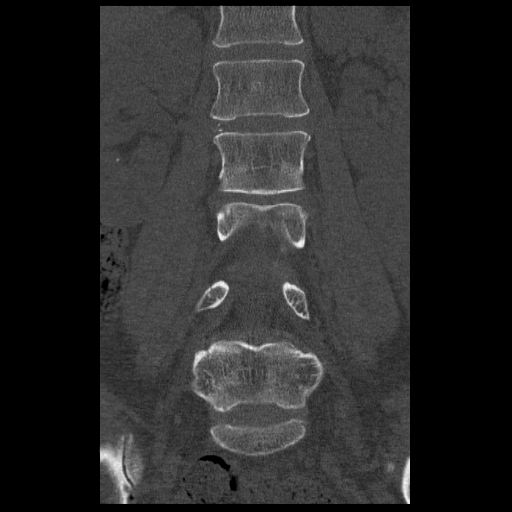
[im 33/55  bone]
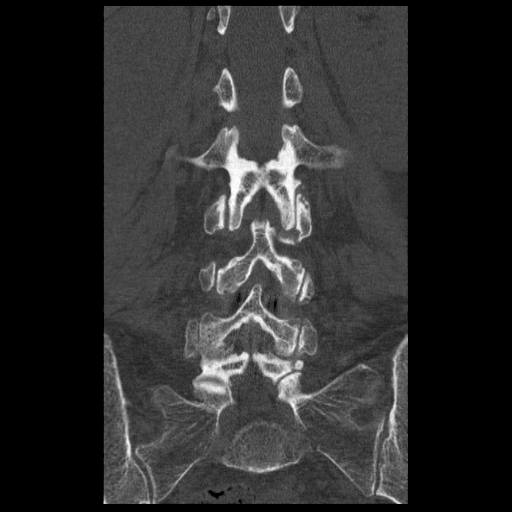

[Series 201: sagittal · sagittal · 0.45mm/px · 5 of 50 slices shown, 6 images]
[im 17/50  bone]
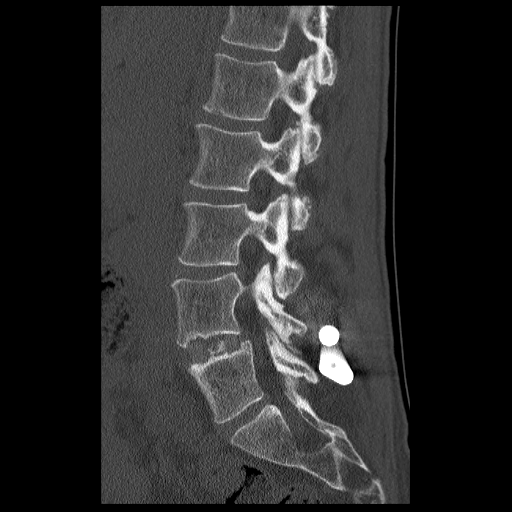
[im 21/50  bone]
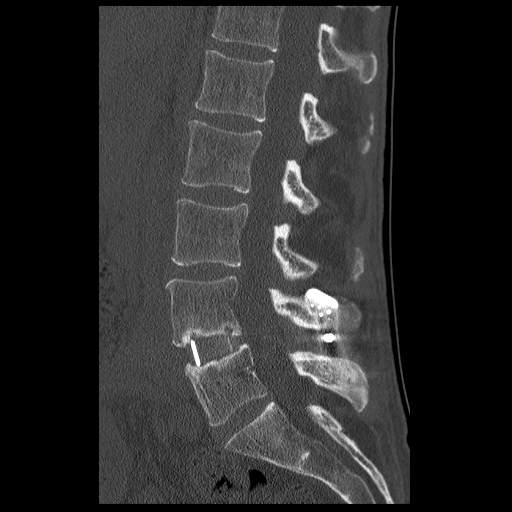
[im 25/50  soft-tissue]
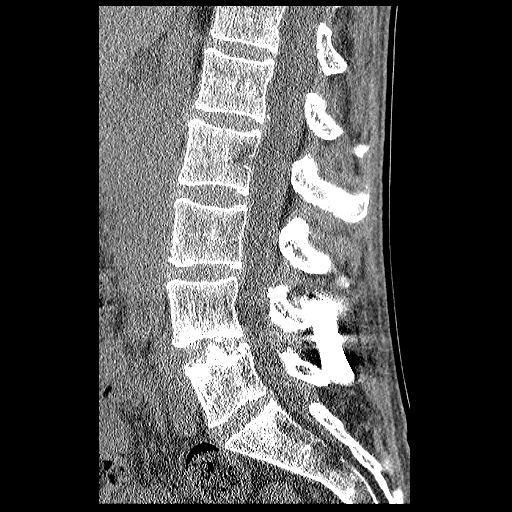
[im 25/50  bone]
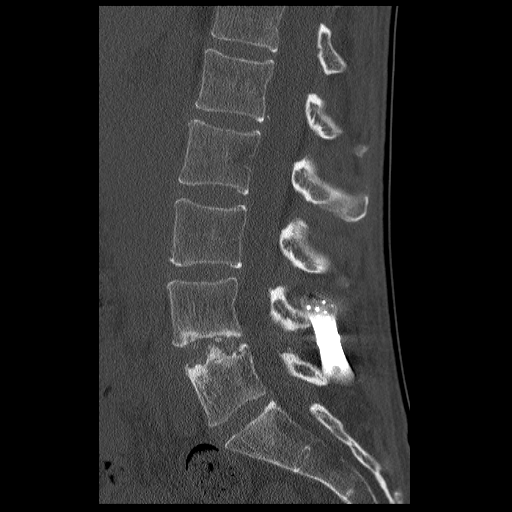
[im 29/50  bone]
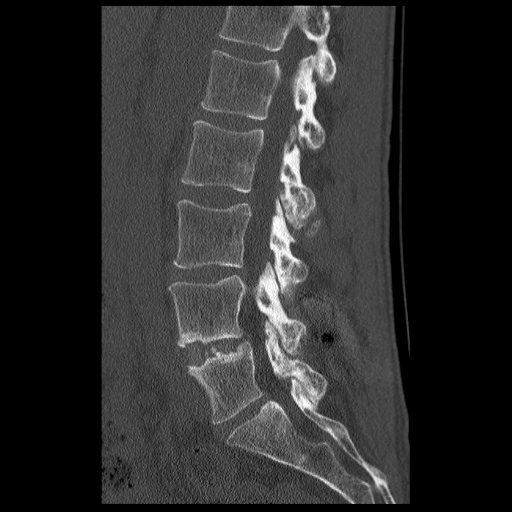
[im 33/50  bone]
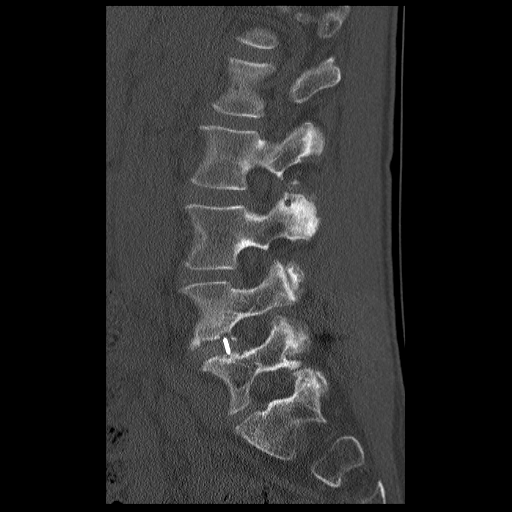

[13 of 33 positions shown; findings below may reference images not displayed]

FINDINGS: The lumbar alignment is stable and near anatomic. There is a minimal
anterolisthesis at L4-5 status post interbody and X-Stop fusion. No
hardware loosening or displacement is identified. However, no solid
interbody fusion is seen at L4-5.

There is no evidence of acute fracture. There is a chronic
left-sided pars defect at L5. No paraspinal abnormalities are
identified. Numerous small right renal calculi are present.

T12-L1: Normal interspace.

L1-2: Stable mild disc bulging. No spinal stenosis or nerve root
encroachment.

L2-3: The disc appears normal. There is mild asymmetric facet
hypertrophy on the left. No spinal stenosis or nerve root
encroachment.

L3-4: Stable mild disc bulging with mild facet and ligamentous
hypertrophy. No spinal stenosis or nerve root encroachment.

L4-5: Stable appearance status post fusion as described above. No
spinal stenosis or nerve root encroachment.

L5-S1: Transitional anatomy with right-sided lumbosacral
assimilation joint and chronic left-sided pars defect. No spinal
stenosis or nerve root encroachment.
IMPRESSION: 1. Stable findings at L4-5 status post interbody and X stop device
fusion. No solid interbody fusion or recurrent spinal stenosis
identified.
2. No spinal stenosis or nerve root encroachment is seen at the
additional levels. There is stable mild disc bulging at L1-2 and
L3-4.
3. Stable mild facet disease and chronic left-sided L5 pars defect.

## 2013-01-05 NOTE — Progress Notes (Signed)
Pt very nervous-[facility] labs done-all preop teaching reviewed

## 2013-01-08 ENCOUNTER — Ambulatory Visit (HOSPITAL_BASED_OUTPATIENT_CLINIC_OR_DEPARTMENT_OTHER)
Admission: RE | Admit: 2013-01-08 | Discharge: 2013-01-08 | Disposition: A | Discharge status: Home / Self Care | Payer: BC Managed Care – PPO | Source: Ambulatory Visit | Attending: Surgery | Admitting: Surgery

## 2013-01-08 ENCOUNTER — Encounter (HOSPITAL_BASED_OUTPATIENT_CLINIC_OR_DEPARTMENT_OTHER): Payer: BC Managed Care – PPO | Admitting: Anesthesiology

## 2013-01-08 ENCOUNTER — Encounter (HOSPITAL_BASED_OUTPATIENT_CLINIC_OR_DEPARTMENT_OTHER)
Admission: RE | Disposition: A | Discharge status: Home / Self Care | Payer: Self-pay | Source: Ambulatory Visit | Attending: Surgery

## 2013-01-08 ENCOUNTER — Ambulatory Visit
Admission: RE | Admit: 2013-01-08 | Discharge: 2013-01-08 | Disposition: A | Discharge status: Home / Self Care | Payer: BC Managed Care – PPO | Source: Ambulatory Visit | Attending: Surgery | Admitting: Surgery

## 2013-01-08 ENCOUNTER — Ambulatory Visit (HOSPITAL_BASED_OUTPATIENT_CLINIC_OR_DEPARTMENT_OTHER): Payer: BC Managed Care – PPO | Admitting: Anesthesiology

## 2013-01-08 ENCOUNTER — Encounter (HOSPITAL_BASED_OUTPATIENT_CLINIC_OR_DEPARTMENT_OTHER): Payer: Self-pay | Admitting: Anesthesiology

## 2013-01-08 DIAGNOSIS — R920 Mammographic microcalcification found on diagnostic imaging of breast: Secondary | ICD-10-CM

## 2013-01-08 DIAGNOSIS — N6089 Other benign mammary dysplasias of unspecified breast: Secondary | ICD-10-CM | POA: Insufficient documentation

## 2013-01-08 DIAGNOSIS — N6019 Diffuse cystic mastopathy of unspecified breast: Secondary | ICD-10-CM

## 2013-01-08 DIAGNOSIS — Z01812 Encounter for preprocedural laboratory examination: Secondary | ICD-10-CM | POA: Insufficient documentation

## 2013-01-08 HISTORY — DX: Major depressive disorder, single episode, unspecified: F32.9

## 2013-01-08 HISTORY — DX: Anemia, unspecified: D64.9

## 2013-01-08 HISTORY — DX: Anxiety disorder, unspecified: F41.9

## 2013-01-08 HISTORY — DX: Encounter for fitting and adjustment of spectacles and contact lenses: Z46.0

## 2013-01-08 HISTORY — DX: Depression, unspecified: F32.A

## 2013-01-08 HISTORY — DX: Hypothyroidism, unspecified: E03.9

## 2013-01-08 HISTORY — PX: BREAST BIOPSY: SHX20

## 2013-01-08 LAB — POCT HEMOGLOBIN-HEMACUE: Hemoglobin: 15.2 g/dL — ABNORMAL HIGH (ref 12.0–15.0)

## 2013-01-08 IMAGING — MG MAMMOGRAPHY
1 series · 1 of 1 positions shown · non-contrast
Comparison: none

[R]
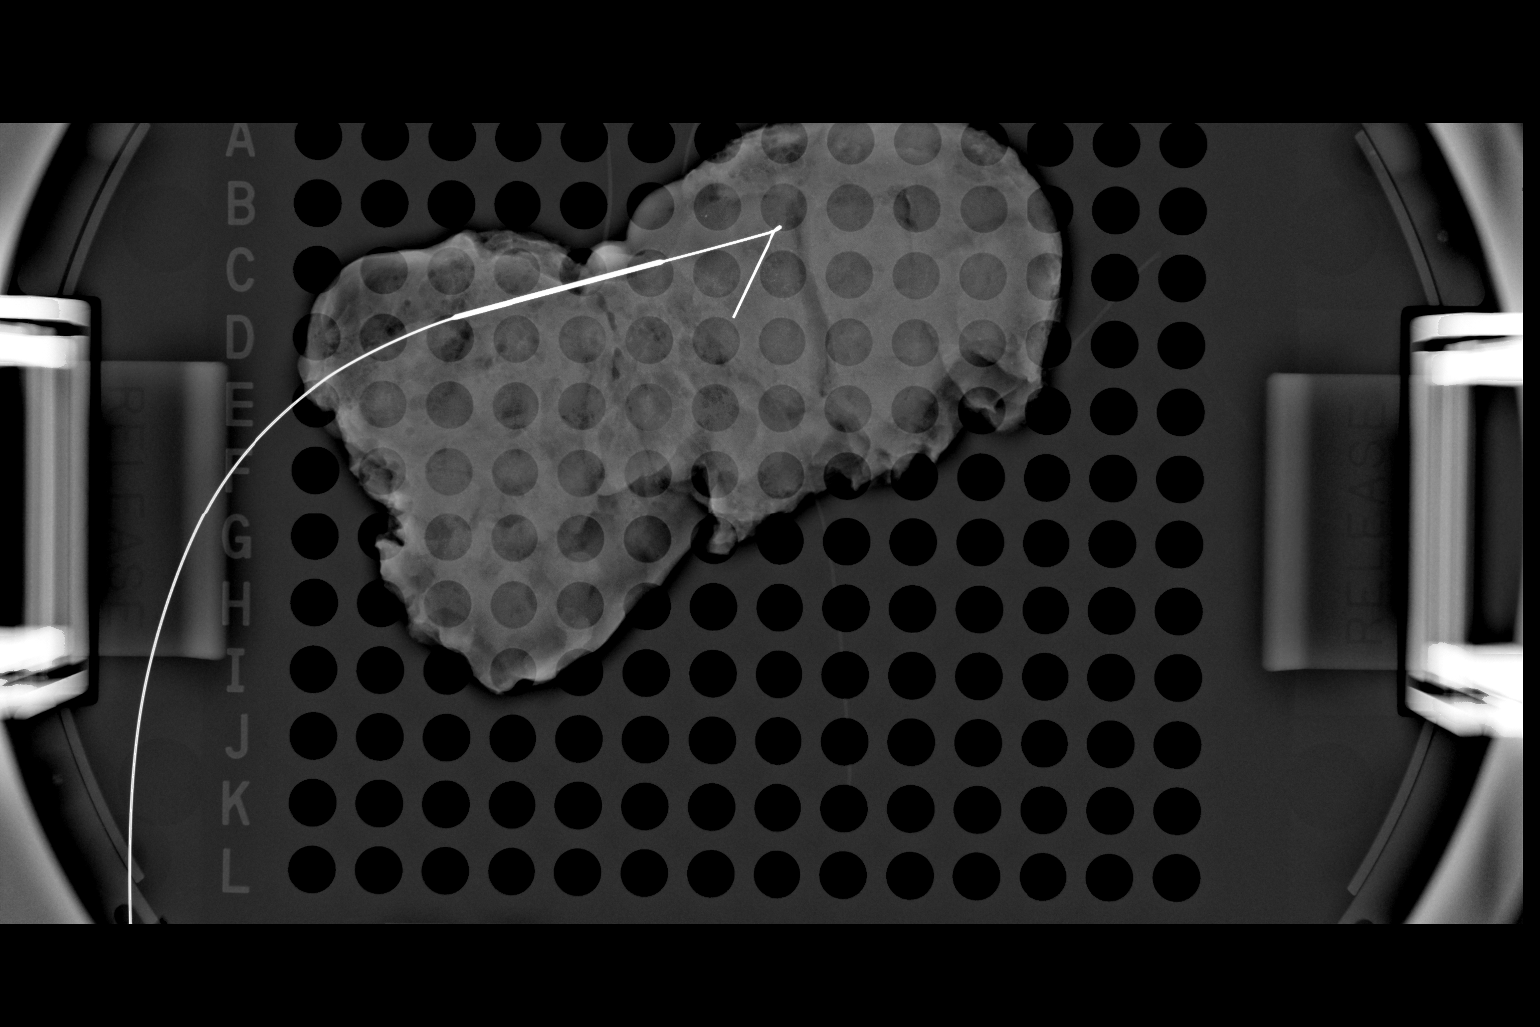

[1 of 1 positions shown; findings below may reference images not displayed]

OBSTETRICS REPORT
                      (Signed Final [DATE] [DATE])

Procedures

 US UA Cord Doppler                                    [0S]
 [HOSPITAL]                                         [0S]
Indications

 Pre-eclampsia
 Assess fetal well being
Fetal Evaluation

 Fetal Heart Rate:  151                          bpm
 Cardiac Activity:  Observed
 Presentation:      Frank breech
 Placenta:          Posterior, above cervical
                    os

 Amniotic Fluid
 AFI FV:      Subjectively low-normal
 AFI Sum:     10.39   cm       16  %Tile     Larg Pckt:     3.3  cm
 RUQ:   2.33    cm   RLQ:    2.15   cm    LUQ:   3.3     cm   LLQ:    2.61   cm
Biophysical Evaluation

 Amniotic F.V:   Within normal limits       F. Tone:        Observed
 F. Movement:    Observed                   Score:          [DATE]
 F. Breathing:   Observed
Gestational Age

 Best:          29w 3d     Det. By:  Previous Ultrasound      EDD:   [DATE]
Doppler - Fetal Vessels

 Umbilical Artery
 S/D:   2.41           24  %tile       RI:
                                       PSV:       -       cm/s


Cervix Uterus Adnexa

 Cervix:       Not visualized
Myomas

 Site                     L(cm)      W(cm)       D(cm)      Location
 Posterior
 Blood Flow                  RI       PI       Comments
                                               Central degeneration seen
Comments

 See inpatient consult note.
Impression

 Single living intrauterine pregnancy at 29 weeks 3 days.
 BPP [DATE].
 Normal umbilical artery dopplers.
 Breech presentation.
Recommendations

 Delivery via LTCS due to severe preeclampsia and breech
 presentation.

 questions or concerns.
                ARINOV

## 2013-01-08 IMAGING — MG MM BREAST WIRE LOCALIZATION*R*
8 series · 8 of 8 positions shown · non-contrast
Comparison: Previous exams.

CLINICAL DATA: 52-year-old female for needle localization of
calcifications in the far posterior outer right breast. Previous
attempt at stereotactic biopsy was unsuccessful due to far location
of these calcifications.

EXAM:
DIAGNOSTIC NEEDLE LOC MAMMO RIGHT

[R CC (1 of 3)]
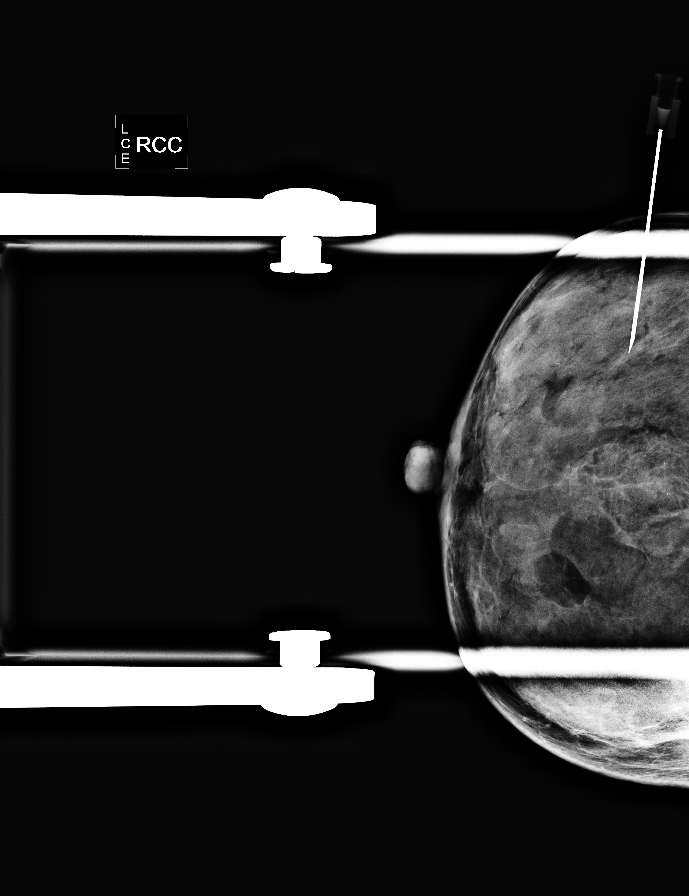

[R CC (2 of 3)]
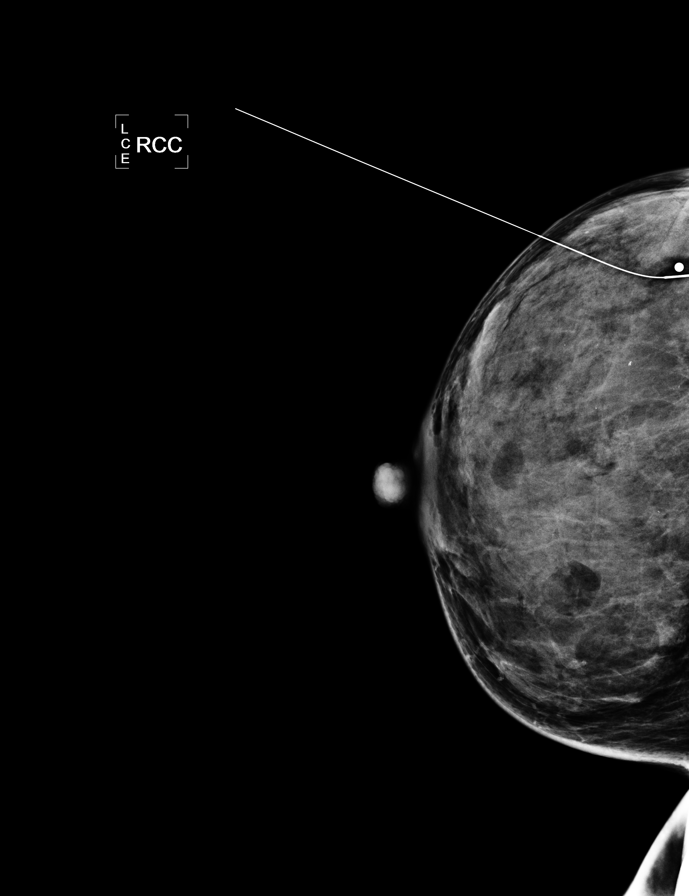

[R ML]
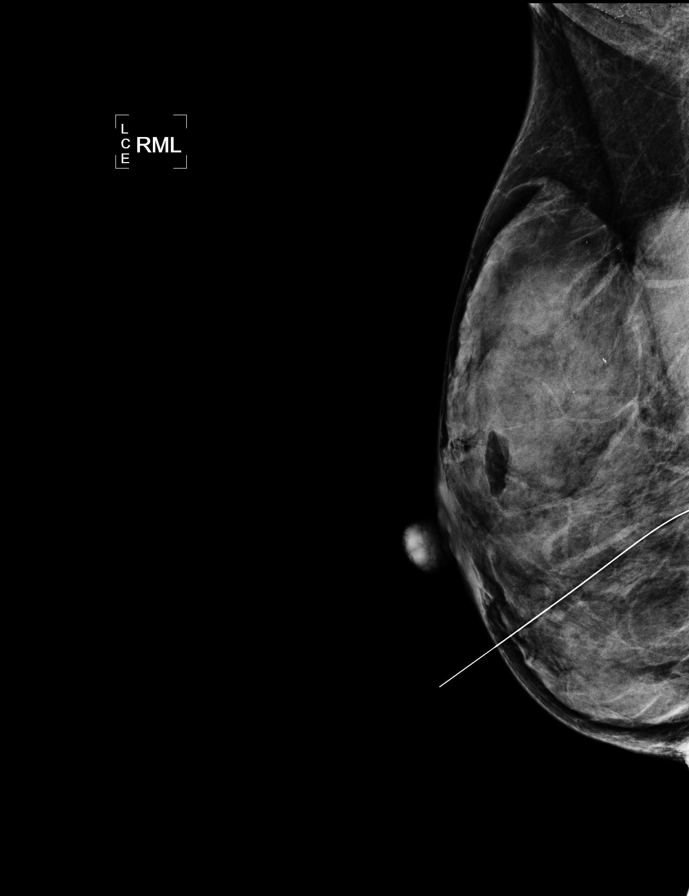

[R FB (1 of 2)]
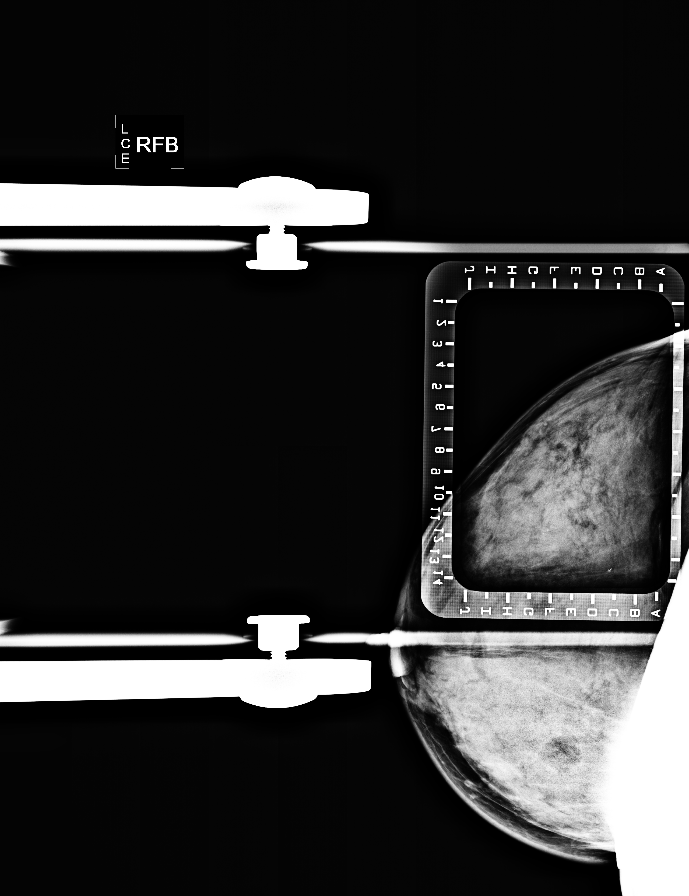

[R FB (2 of 2)]
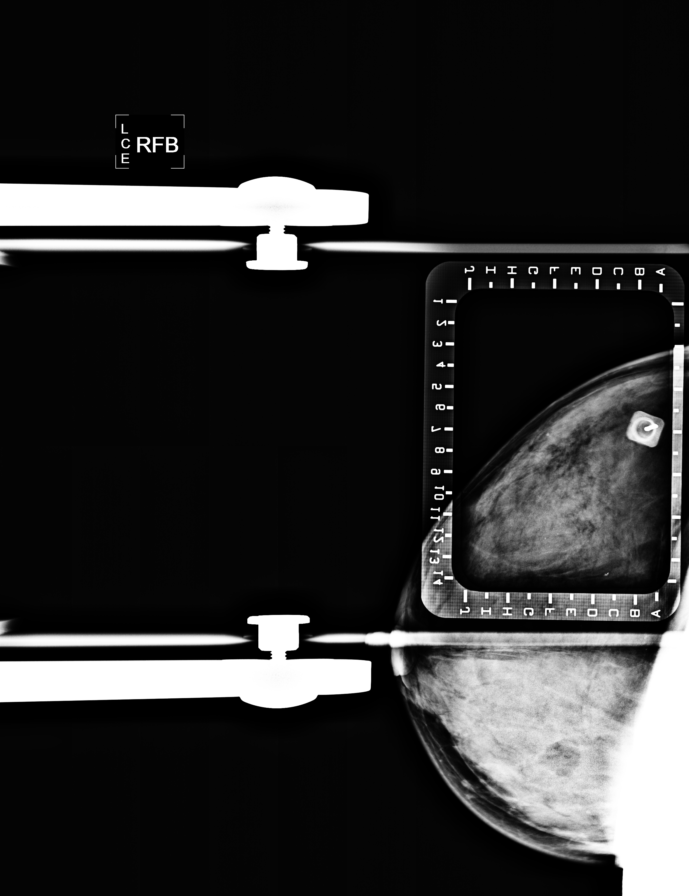

[R XCCL (1 of 2)]
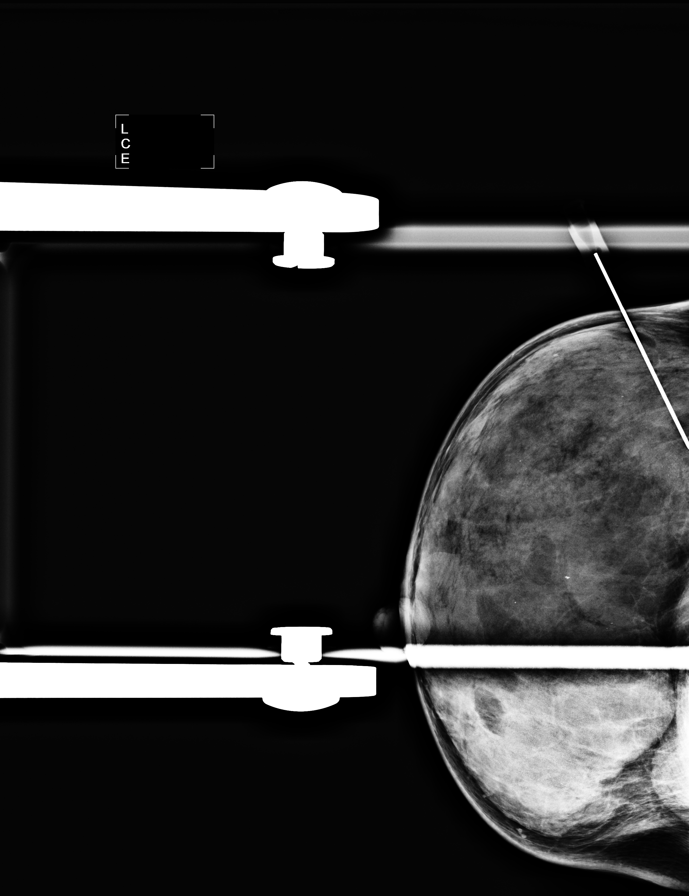

[R XCCL (2 of 2)]
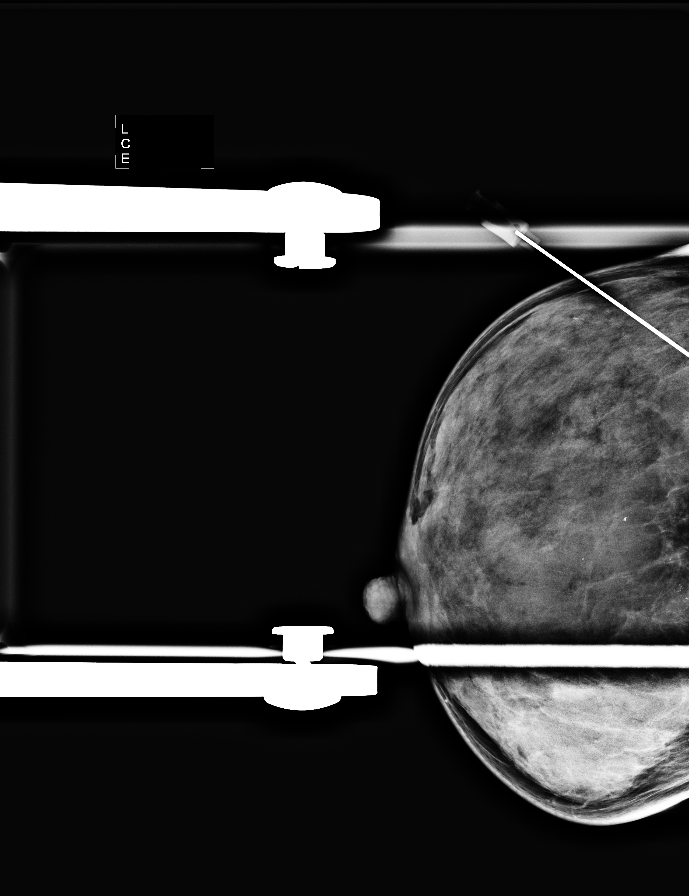

[R CC (3 of 3)]
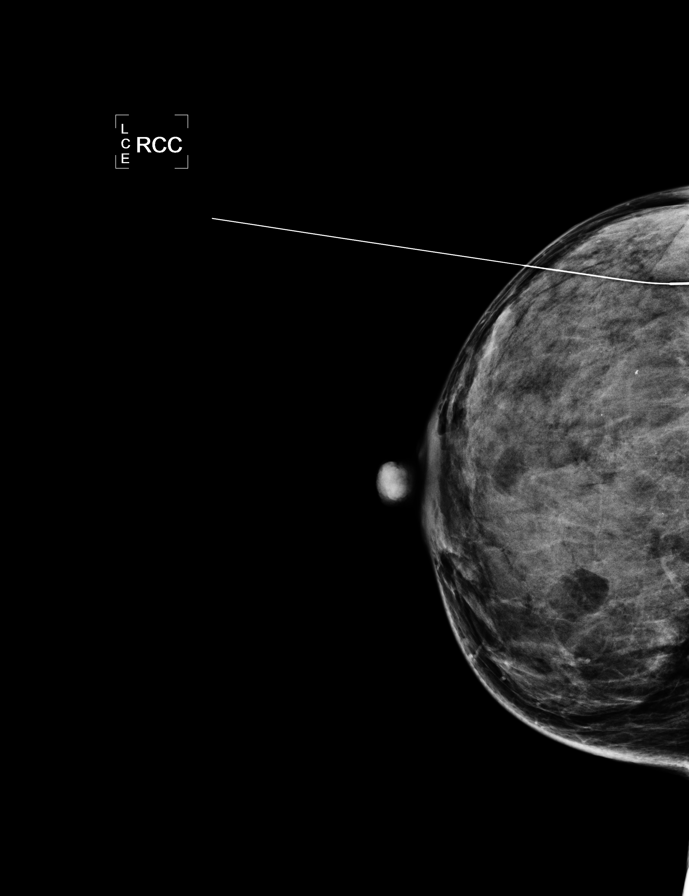

[8 of 8 positions shown; findings below may reference images not displayed]

FINDINGS: Patient presents for needle localization prior to excisional biopsy.
I met with the patient and we discussed the procedure of needle
localization including benefits and alternatives. We discussed the
high likelihood of a successful procedure. We discussed the risks of
the procedure, including infection, bleeding, tissue injury, and
further surgery. Informed, written consent was given. The usual
time-out protocol was performed immediately prior to the procedure.

Using mammographic guidance, sterile technique, 2% lidocaine and a 5
cm modified Kopans needle, the calcifications in the outer far
posterior right breast more localized using an inferior approach.
The films were marked for Dr. MARSHI.

Specimen radiograph was performed [REDACTED] [HOSPITAL], and
confirms the presence of the wire as well as several
microcalcifications present in the tissue sample. The specimen was
marked for pathology.

The patient became lightheaded and faint at the initial attempt of
needle localization, however improved with supine position and cold
compresses, and therefore was able to complete the Needle
localization.

Note these these calcifications were markedly difficult to localize
due to extremely dense breast tissue, small size breast, extreme
posterior location of the calcifications, as well as numerous
additional groups of calcifications throughout the right breast.
They were able to be localized in the CC view, however unable to
confirm on the ML view due to the posterior location.
IMPRESSION: Needle localization right breast breast.

## 2013-01-08 SURGERY — BREAST BIOPSY WITH NEEDLE LOCALIZATION
Anesthesia: Monitor Anesthesia Care | Site: Breast | Laterality: Right | Wound class: Clean

## 2013-01-08 MED ORDER — LIDOCAINE HCL (PF) 1 % IJ SOLN
INTRAMUSCULAR | Status: AC
  Filled 2013-01-08: qty 30

## 2013-01-08 MED ORDER — OXYCODONE HCL 5 MG/5ML PO SOLN: 5.0000 mg | Freq: Once | ORAL | Status: DC | PRN

## 2013-01-08 MED ORDER — BUPIVACAINE-EPINEPHRINE 0.25% -1:200000 IJ SOLN
INTRAMUSCULAR | Status: DC | PRN
  Administered 2013-01-08: 30 mL

## 2013-01-08 MED ORDER — BUPIVACAINE-EPINEPHRINE PF 0.25-1:200000 % IJ SOLN
INTRAMUSCULAR | Status: AC
  Filled 2013-01-08: qty 30

## 2013-01-08 MED ORDER — LIDOCAINE HCL (CARDIAC) 20 MG/ML IV SOLN
INTRAVENOUS | Status: DC | PRN
  Administered 2013-01-08: 50 mg via INTRAVENOUS

## 2013-01-08 MED ORDER — MIDAZOLAM HCL 2 MG/2ML IJ SOLN: 1.0000 mg | INTRAMUSCULAR | Status: DC | PRN

## 2013-01-08 MED ORDER — LACTATED RINGERS IV SOLN
INTRAVENOUS | Status: DC
  Administered 2013-01-08: 13:00:00 via INTRAVENOUS

## 2013-01-08 MED ORDER — FENTANYL CITRATE 0.05 MG/ML IJ SOLN
INTRAMUSCULAR | Status: AC
  Filled 2013-01-08: qty 4

## 2013-01-08 MED ORDER — CHLORHEXIDINE GLUCONATE 4 % EX LIQD: 1.0000 "application " | Freq: Once | CUTANEOUS | Status: DC

## 2013-01-08 MED ORDER — DEXTROSE 5 % IV SOLN
3.0000 g | INTRAVENOUS | Status: AC
  Administered 2013-01-08: 2 g via INTRAVENOUS

## 2013-01-08 MED ORDER — CEFAZOLIN SODIUM 1-5 GM-% IV SOLN
INTRAVENOUS | Status: AC
  Filled 2013-01-08: qty 50

## 2013-01-08 MED ORDER — MEPERIDINE HCL 25 MG/ML IJ SOLN: 6.2500 mg | INTRAMUSCULAR | Status: DC | PRN

## 2013-01-08 MED ORDER — ONDANSETRON HCL 4 MG/2ML IJ SOLN: 4.0000 mg | Freq: Once | INTRAMUSCULAR | Status: DC | PRN

## 2013-01-08 MED ORDER — PROPOFOL INFUSION 10 MG/ML OPTIME
INTRAVENOUS | Status: DC | PRN
  Administered 2013-01-08: 300 ug/kg/min via INTRAVENOUS

## 2013-01-08 MED ORDER — FENTANYL CITRATE 0.05 MG/ML IJ SOLN
INTRAMUSCULAR | Status: DC | PRN
  Administered 2013-01-08: 100 ug via INTRAVENOUS

## 2013-01-08 MED ORDER — SCOPOLAMINE 1 MG/3DAYS TD PT72
MEDICATED_PATCH | TRANSDERMAL | Status: AC
  Filled 2013-01-08: qty 1

## 2013-01-08 MED ORDER — OXYCODONE HCL 5 MG PO TABS: 5.0000 mg | ORAL_TABLET | Freq: Once | ORAL | Status: DC | PRN

## 2013-01-08 MED ORDER — ONDANSETRON HCL 4 MG/2ML IJ SOLN
INTRAMUSCULAR | Status: DC | PRN
  Administered 2013-01-08: 4 mg via INTRAMUSCULAR

## 2013-01-08 MED ORDER — FENTANYL CITRATE 0.05 MG/ML IJ SOLN: 50.0000 ug | INTRAMUSCULAR | Status: DC | PRN

## 2013-01-08 MED ORDER — TRAMADOL HCL 50 MG PO TABS: 50.0000 mg | ORAL_TABLET | Freq: Four times a day (QID) | ORAL | Status: DC | PRN

## 2013-01-08 MED ORDER — MIDAZOLAM HCL 5 MG/5ML IJ SOLN
INTRAMUSCULAR | Status: DC | PRN
  Administered 2013-01-08 (×2): 2 mg via INTRAVENOUS

## 2013-01-08 MED ORDER — SODIUM BICARBONATE 4 % IV SOLN
INTRAVENOUS | Status: AC
  Filled 2013-01-08: qty 5

## 2013-01-08 MED ORDER — CEFAZOLIN SODIUM-DEXTROSE 2-3 GM-% IV SOLR
INTRAVENOUS | Status: AC
  Filled 2013-01-08: qty 50

## 2013-01-08 MED ORDER — MIDAZOLAM HCL 2 MG/2ML IJ SOLN
INTRAMUSCULAR | Status: AC
  Filled 2013-01-08: qty 4

## 2013-01-08 MED ORDER — HYDROMORPHONE HCL PF 1 MG/ML IJ SOLN: 0.2500 mg | INTRAMUSCULAR | Status: DC | PRN

## 2013-01-08 MED ORDER — SCOPOLAMINE 1 MG/3DAYS TD PT72
MEDICATED_PATCH | TRANSDERMAL | Status: DC | PRN
  Administered 2013-01-08: 1 via TRANSDERMAL

## 2013-01-08 SURGICAL SUPPLY — 46 items
ADH SKN CLS APL DERMABOND .7 (GAUZE/BANDAGES/DRESSINGS) ×1
BINDER BREAST LRG (GAUZE/BANDAGES/DRESSINGS) IMPLANT
BINDER BREAST MEDIUM (GAUZE/BANDAGES/DRESSINGS) IMPLANT
BINDER BREAST XLRG (GAUZE/BANDAGES/DRESSINGS) IMPLANT
BINDER BREAST XXLRG (GAUZE/BANDAGES/DRESSINGS) IMPLANT
BLADE SURG 15 STRL LF DISP TIS (BLADE) ×1 IMPLANT
BLADE SURG 15 STRL SS (BLADE) ×2
CANISTER SUCT 1200ML W/VALVE (MISCELLANEOUS) ×2 IMPLANT
CHLORAPREP W/TINT 26ML (MISCELLANEOUS) ×2 IMPLANT
CLIP TI WIDE RED SMALL 6 (CLIP) IMPLANT
COVER MAYO STAND STRL (DRAPES) ×2 IMPLANT
COVER TABLE BACK 60X90 (DRAPES) ×2 IMPLANT
DECANTER SPIKE VIAL GLASS SM (MISCELLANEOUS) ×2 IMPLANT
DERMABOND ADVANCED (GAUZE/BANDAGES/DRESSINGS) ×1
DERMABOND ADVANCED .7 DNX12 (GAUZE/BANDAGES/DRESSINGS) ×1 IMPLANT
DEVICE DUBIN W/COMP PLATE 8390 (MISCELLANEOUS) ×1 IMPLANT
DRAPE LAPAROSCOPIC ABDOMINAL (DRAPES) IMPLANT
DRAPE PED LAPAROTOMY (DRAPES) ×2 IMPLANT
DRAPE UTILITY XL STRL (DRAPES) ×2 IMPLANT
ELECT COATED BLADE 2.86 ST (ELECTRODE) ×2 IMPLANT
ELECT REM PT RETURN 9FT ADLT (ELECTROSURGICAL) ×2
ELECTRODE REM PT RTRN 9FT ADLT (ELECTROSURGICAL) ×1 IMPLANT
GLOVE BIO SURGEON STRL SZ 6.5 (GLOVE) ×1 IMPLANT
GLOVE BIOGEL M STRL SZ7.5 (GLOVE) ×1 IMPLANT
GLOVE BIOGEL PI IND STRL 8 (GLOVE) ×1 IMPLANT
GLOVE BIOGEL PI INDICATOR 8 (GLOVE) ×1
GLOVE ECLIPSE 8.0 STRL XLNG CF (GLOVE) ×2 IMPLANT
GOWN PREVENTION PLUS XLARGE (GOWN DISPOSABLE) ×2 IMPLANT
KIT MARKER MARGIN INK (KITS) IMPLANT
NDL HYPO 25X1 1.5 SAFETY (NEEDLE) ×1 IMPLANT
NEEDLE HYPO 25X1 1.5 SAFETY (NEEDLE) ×2 IMPLANT
NS IRRIG 1000ML POUR BTL (IV SOLUTION) ×2 IMPLANT
PACK BASIN DAY SURGERY FS (CUSTOM PROCEDURE TRAY) ×2 IMPLANT
PENCIL BUTTON HOLSTER BLD 10FT (ELECTRODE) ×2 IMPLANT
SLEEVE SCD COMPRESS KNEE MED (MISCELLANEOUS) ×2 IMPLANT
SPONGE LAP 4X18 X RAY DECT (DISPOSABLE) ×2 IMPLANT
STAPLER VISISTAT 35W (STAPLE) IMPLANT
SUT MON AB 4-0 PC3 18 (SUTURE) ×2 IMPLANT
SUT SILK 2 0 SH (SUTURE) ×1 IMPLANT
SUT VIC AB 3-0 SH 27 (SUTURE) ×2
SUT VIC AB 3-0 SH 27X BRD (SUTURE) ×1 IMPLANT
SYR CONTROL 10ML LL (SYRINGE) ×2 IMPLANT
TOWEL OR 17X24 6PK STRL BLUE (TOWEL DISPOSABLE) ×4 IMPLANT
TOWEL OR NON WOVEN STRL DISP B (DISPOSABLE) ×2 IMPLANT
TUBE CONNECTING 20X1/4 (TUBING) ×2 IMPLANT
YANKAUER SUCT BULB TIP NO VENT (SUCTIONS) ×2 IMPLANT

## 2013-01-08 NOTE — Anesthesia Postprocedure Evaluation (Signed)
Anesthesia Post Note  Patient: Kaitlyn Lopez  Procedure(s) Performed: Procedure(s) (LRB): RIGHT BREAST WITH NEEDLE LOCALIZATION (Right)  Anesthesia type: general  Patient location: PACU  Post pain: Pain level controlled  Post assessment: Patient's Cardiovascular Status Stable  Last Vitals:  Filed Vitals:   01/08/13 1445  BP: 121/69  Pulse: 75  Temp:   Resp: 16    Post vital signs: Reviewed and stable  Level of consciousness: sedated  Complications: No apparent anesthesia complications

## 2013-01-08 NOTE — Anesthesia Preprocedure Evaluation (Addendum)
Anesthesia Evaluation  Patient identified by MRN, date of birth, ID band Patient awake    Reviewed: Allergy & Precautions, H&P , NPO status , Patient's Chart, lab work & pertinent test results  Airway Mallampati: I TM Distance: >3 FB Neck ROM: Full    Dental   Pulmonary          Cardiovascular     Neuro/Psych Anxiety Depression    GI/Hepatic   Endo/Other  Hypothyroidism   Renal/GU      Musculoskeletal   Abdominal   Peds  Hematology   Anesthesia Other Findings   Reproductive/Obstetrics                           Anesthesia Physical Anesthesia Plan  ASA: II  Anesthesia Plan: MAC   Post-op Pain Management:    Induction: Intravenous  Airway Management Planned: Mask  Additional Equipment:   Intra-op Plan:   Post-operative Plan:   Informed Consent: I have reviewed the patients History and Physical, chart, labs and discussed the procedure including the risks, benefits and alternatives for the proposed anesthesia with the patient or authorized representative who has indicated his/her understanding and acceptance.     Plan Discussed with: CRNA and Surgeon  Anesthesia Plan Comments:        Anesthesia Quick Evaluation

## 2013-01-08 NOTE — Op Note (Signed)
Right Breast Lumpectomy needle localized   Indications: This patient presents with history of a right breast microcalcifications. Given the clinical history and physical exam, along with indicated diagnostic studies, breast biopsy will be performed.  Pre-operative Diagnosis: right breast microcalcifications  Post-operative Diagnosis: same  Surgeon: Harriette Bouillon A.   Assistants: none  Anesthesia: Monitored Local Anesthesia with Sedation  ASA Class: 2  Procedure Details  The patient was seen in the Holding Room. The risks, benefits, complications, treatment options, and expected outcomes were discussed with the patient. The possibilities of reaction to medication, pulmonary aspiration, bleeding, infection, the need for additional procedures, failure to diagnose a condition, and creating a complication requiring transfusion or operation were discussed with the patient. The patient concurred with the proposed plan, giving informed consent. The site of surgery properly noted/marked. The patient was taken to Operating Room, identified as Vic Blackbird, and the procedure verified as lumpectomy. A Time Out was held and the above information confirmed. Wire was placed by radiology. Very dense breast tissue. This was a difficult wire placement.   After induction of anesthesia, the right breast and chest were prepped and draped in standard fashion. The lumpectomy was performed by creating an oblique incision over the lower outer quadrant of the breast. Hemostasis was achieved with cautery. The wound was irrigated and closed with a 3-0 Vicryl and 4 O monocryl  subcuticular closure in layers.  Specimen oriented.    Sterile dressings were applied. At the end of the operation, all sponge, instrument, and needle counts were correct.  Findings: grossly clear surgical margins  Estimated Blood Loss:  less than 50 mL         Drains: none          Total IV Fluids: 400 mL         Specimens:  Right  breast mass/ calcifications             Complications:  None; patient tolerated the procedure well.         Disposition: PACU - hemodynamically stable.         Condition: Stable

## 2013-01-08 NOTE — Interval H&P Note (Signed)
History and Physical Interval Note:  01/08/2013 12:57 PM  Kaitlyn Lopez  has presented today for surgery, with the diagnosis of right breast microcalcifications  The various methods of treatment have been discussed with the patient and family. After consideration of risks, benefits and other options for treatment, the patient has consented to  Procedure(s): RIGHT BREAST WITH NEEDLE LOCALIZATION (Right) as a surgical intervention .  The patient's history has been reviewed, patient examined, no change in status, stable for surgery.  I have reviewed the patient's chart and labs.  Questions were answered to the patient's satisfaction.     Tyonna Talerico A.

## 2013-01-08 NOTE — Progress Notes (Signed)
Attempted to removed ring from pt's right finger. Pt states she can get it off some days but not today. Attempted to remove twice using windex and also with umbilical tape around finger unsuccessfully. Aborted attempts after pt becoming increasingly anxious. Pt. Informed of risks of leaving ring on by OR staff.

## 2013-01-08 NOTE — Anesthesia Procedure Notes (Signed)
Procedure Name: MAC Performed by: Jerric Oyen W Pre-anesthesia Checklist: Patient identified, Timeout performed, Emergency Drugs available, Suction available and Patient being monitored Patient Re-evaluated:Patient Re-evaluated prior to inductionOxygen Delivery Method: Simple face mask Placement Confirmation: positive ETCO2 Dental Injury: Teeth and Oropharynx as per pre-operative assessment      

## 2013-01-08 NOTE — Transfer of Care (Signed)
Immediate Anesthesia Transfer of Care Note  Patient: Kaitlyn Lopez  Procedure(s) Performed: Procedure(s): RIGHT BREAST WITH NEEDLE LOCALIZATION (Right)  Patient Location: PACU  Anesthesia Type:MAC  Level of Consciousness: awake and sedated  Airway & Oxygen Therapy: Patient Spontanous Breathing and Patient connected to face mask oxygen  Post-op Assessment: Report given to PACU RN and Post -op Vital signs reviewed and stable  Post vital signs: Reviewed and stable  Complications: No apparent anesthesia complications

## 2013-01-08 NOTE — H&P (View-Only) (Signed)
Patient ID: Kaitlyn Lopez, female   DOB: 08/28/1960, 52 y.o.   MRN: [id number]  Chief Complaint  Patient presents with  . New Evaluation    eval Rt Br calcification    HPI Kaitlyn Lopez is a 52 y.o. female.  Patient sent at request of Dr. Jennifer Jarosz of the [facility] for atypical right breast microcalcifications. Patient has a history of fibrocystic breast disease with biopsy of left breast in 1992. History of painful breasts with multiple lumps. Right breast calcifications noted this year on screening mammography and subsequent diagnostic mammograms in the deep right central breast. These are felt to be suspicious brought were too deep for  core biopsy. HPI  Past Medical History  Diagnosis Date  . Thyroid disease     Past Surgical History  Procedure Laterality Date  . Transforaminal lumbar interbody fusion w/ mis 4 level    . Breast surgery  1992    lumpectomy    Family History  Problem Relation Age of Onset  . Cancer Maternal Grandfather     breast    Social History History  Substance Use Topics  . Smoking status: Never Smoker   . Smokeless tobacco: Never Used  . Alcohol Use: Yes    Allergies  Allergen Reactions  . Celebrex [Celecoxib] Itching  . Codeine Nausea And Vomiting  . Percocet [Oxycodone-Acetaminophen] Nausea Only    Current Outpatient Prescriptions  Medication Sig Dispense Refill  . Ascorbic Acid (VITAMIN C) 1000 MG tablet Take 1,000 mg by mouth daily.      . cholecalciferol (VITAMIN D) 1000 UNITS tablet Take 1,000 Units by mouth daily.      . DULoxetine (CYMBALTA) 20 MG capsule Take 20 mg by mouth daily.      . estrogen, conjugated,-medroxyprogesterone (PREMPRO) 0.45-1.5 MG per tablet Take 1 tablet by mouth daily.      . ferrous fumarate (HEMOCYTE - 106 MG FE) 325 (106 FE) MG TABS tablet Take 1 tablet by mouth.      . Thiamine HCl (VITAMIN B-1) 250 MG tablet Take 250 mg by mouth daily.      . thyroid (ARMOUR) 120 MG tablet Take 120  mg by mouth daily before breakfast.       No current facility-administered medications for this visit.    Review of Systems Review of Systems  Constitutional: Negative for fever, chills and unexpected weight change.  HENT: Negative for hearing loss, congestion, sore throat, trouble swallowing and voice change.   Eyes: Negative for visual disturbance.  Respiratory: Negative for cough and wheezing.   Cardiovascular: Negative for chest pain, palpitations and leg swelling.  Gastrointestinal: Negative for nausea, vomiting, abdominal pain, diarrhea, constipation, blood in stool, abdominal distention and anal bleeding.  Genitourinary: Negative for hematuria, vaginal bleeding and difficulty urinating.  Musculoskeletal: Negative for arthralgias.  Skin: Negative for rash and wound.  Neurological: Negative for seizures, syncope and headaches.  Hematological: Negative for adenopathy. Does not bruise/bleed easily.  Psychiatric/Behavioral: Negative for confusion.    Blood pressure 102/64, pulse 66, temperature 98.2 F (36.8 C), temperature source Temporal, resp. rate 16, height 5' 8" (1.727 m), weight 120 lb (54.432 kg).  Physical Exam Physical Exam  Constitutional: She is oriented to person, place, and time. She appears well-developed and well-nourished.  HENT:  Head: Normocephalic and atraumatic.  Eyes: EOM are normal. Pupils are equal, round, and reactive to light.  Neck: Normal range of motion. Neck supple.  Cardiovascular: Normal rate and regular rhythm.   Pulmonary/Chest: Effort   normal and breath sounds normal. Right breast exhibits tenderness. Right breast exhibits no inverted nipple and no nipple discharge. Left breast exhibits tenderness. Left breast exhibits no inverted nipple and no nipple discharge.    Musculoskeletal: Normal range of motion.  Neurological: She is alert and oriented to person, place, and time.  Skin: Skin is warm and dry.  Psychiatric: She has a normal mood and  affect. Her behavior is normal. Judgment and thought content normal.    Data ACR Breast Density Category d: The breast tissue is extremely dense.  FINDINGS:  There are grouped amorphous calcifications in the outer slightly  lower right breast at the approximate 8 o'clock position spanning a  distance of 7 mm. This is confirmed on the additional spot  compression CC and MLO views.  CAD  IMPRESSION:  Suspicious right breast calcifications.  RECOMMENDATION:  Stereotactic guided biopsy of the suspicious calcifications in the  right breast. This is scheduled for Thursday October 2nd 2014 3 p.m.  It has been explained to the patient that these calcifications are  in a posterior location and may be difficult to reach via  stereotactic guided biopsy, and there is a chance that biopsy may be  unsuccessful. The patient demonstrates understanding.  I have discussed the findings and recommendations with the patient.  Results were also provided in writing at the conclusion of the  visit. If applicable, a reminder letter will be sent to the patient  regarding the next appointment.  BI-RADS CATEGORY 4: Suspicious abnormality - biopsy should be  considered.  Electronically Signed  By: Jennifer Jarosz M.D.  On: 12/11/2012 12:21    Assessment    Atypical right breast microcalcifications central and deep with history of fibrocystic breast disease    Plan    Discussed operative removal of these calcifications with right breast needle localized lumpectomy versus observation. Risks, benefits and alternative therapies discussed. She would like to proceed with surgical removal.The procedure has been discussed with the patient. Alternatives to surgery have been discussed with the patient.  Risks of surgery include bleeding,  Infection,  Seroma formation, death,  and the need for further surgery.   The patient understands and wishes to proceed.       Lorain Fettes A. 12/28/2012, 10:43 AM    

## 2013-01-11 ENCOUNTER — Encounter (HOSPITAL_BASED_OUTPATIENT_CLINIC_OR_DEPARTMENT_OTHER): Payer: Self-pay | Admitting: Surgery

## 2013-01-12 ENCOUNTER — Telehealth (INDEPENDENT_AMBULATORY_CARE_PROVIDER_SITE_OTHER): Payer: Self-pay

## 2013-01-12 NOTE — Telephone Encounter (Signed)
Called pt and let her know path was benign

## 2013-01-12 NOTE — Telephone Encounter (Signed)
Message copied by Brennan Bailey on Tue Jan 12, 2013 10:45 AM ------      Message from: Harriette Bouillon A      Created: Mon Jan 11, 2013  6:10 PM       Benign no cancer ------

## 2013-01-25 ENCOUNTER — Ambulatory Visit (INDEPENDENT_AMBULATORY_CARE_PROVIDER_SITE_OTHER): Payer: BC Managed Care – PPO | Admitting: Surgery

## 2013-01-25 ENCOUNTER — Encounter (INDEPENDENT_AMBULATORY_CARE_PROVIDER_SITE_OTHER): Payer: Self-pay | Admitting: Surgery

## 2013-01-25 VITALS — BP 108/64 | HR 72 | Temp 98.8°F | Resp 16 | Ht 68.0 in | Wt 121.4 lb

## 2013-01-25 DIAGNOSIS — Z9889 Other specified postprocedural states: Secondary | ICD-10-CM

## 2013-01-25 NOTE — Progress Notes (Signed)
Patient returns after right breast needle localized lumpectomy. She is doing well. Pathology showed FIBROCYSTIC CHANGES WITH CALCIFICATIONS AND FOCAL USUAL DUCTAL HYPERPLASIA - NO EVIDENCE OF MALIGNANCY.   Exam: Right breast incision clean dry and intact. No seroma or signs of infection.  Impression: Status post right breast lumpectomy showing fibrocystic changes with microcalcifications  Plan: Resume yearly screening. Return as needed. Discussed different breast imaging modalities as well as benefits and limitations of each. Questions were answered.

## 2013-01-25 NOTE — Patient Instructions (Signed)
RETURN TO FULL ACTIVITY.  CONTINUE SCREENING.

## 2013-03-02 ENCOUNTER — Other Ambulatory Visit: Payer: Self-pay | Admitting: Obstetrics and Gynecology

## 2013-03-02 DIAGNOSIS — R922 Inconclusive mammogram: Secondary | ICD-10-CM

## 2013-03-02 DIAGNOSIS — Z803 Family history of malignant neoplasm of breast: Secondary | ICD-10-CM

## 2013-03-02 DIAGNOSIS — R9230 Dense breasts, unspecified: Secondary | ICD-10-CM

## 2013-03-11 ENCOUNTER — Other Ambulatory Visit: Payer: BC Managed Care – PPO

## 2013-12-02 ENCOUNTER — Other Ambulatory Visit: Payer: Self-pay | Admitting: Orthopedic Surgery

## 2013-12-02 DIAGNOSIS — M25562 Pain in left knee: Secondary | ICD-10-CM

## 2013-12-08 ENCOUNTER — Other Ambulatory Visit: Payer: BC Managed Care – PPO

## 2013-12-09 ENCOUNTER — Ambulatory Visit
Admission: RE | Admit: 2013-12-09 | Discharge: 2013-12-09 | Disposition: A | Discharge status: Home / Self Care | Payer: BC Managed Care – PPO | Source: Ambulatory Visit | Attending: Orthopedic Surgery | Admitting: Orthopedic Surgery

## 2013-12-09 DIAGNOSIS — M25562 Pain in left knee: Secondary | ICD-10-CM

## 2013-12-09 IMAGING — RF DG FLUORO GUIDE NDL PLC/BX
2 series · 2 of 2 positions shown · non-contrast
Comparison: none

CLINICAL DATA: Recurrent meniscal tear.  LEFT knee pain.

[Series 1: (hospital) · 1 of 1 slices shown (1 of 2)]
[im 1/1]
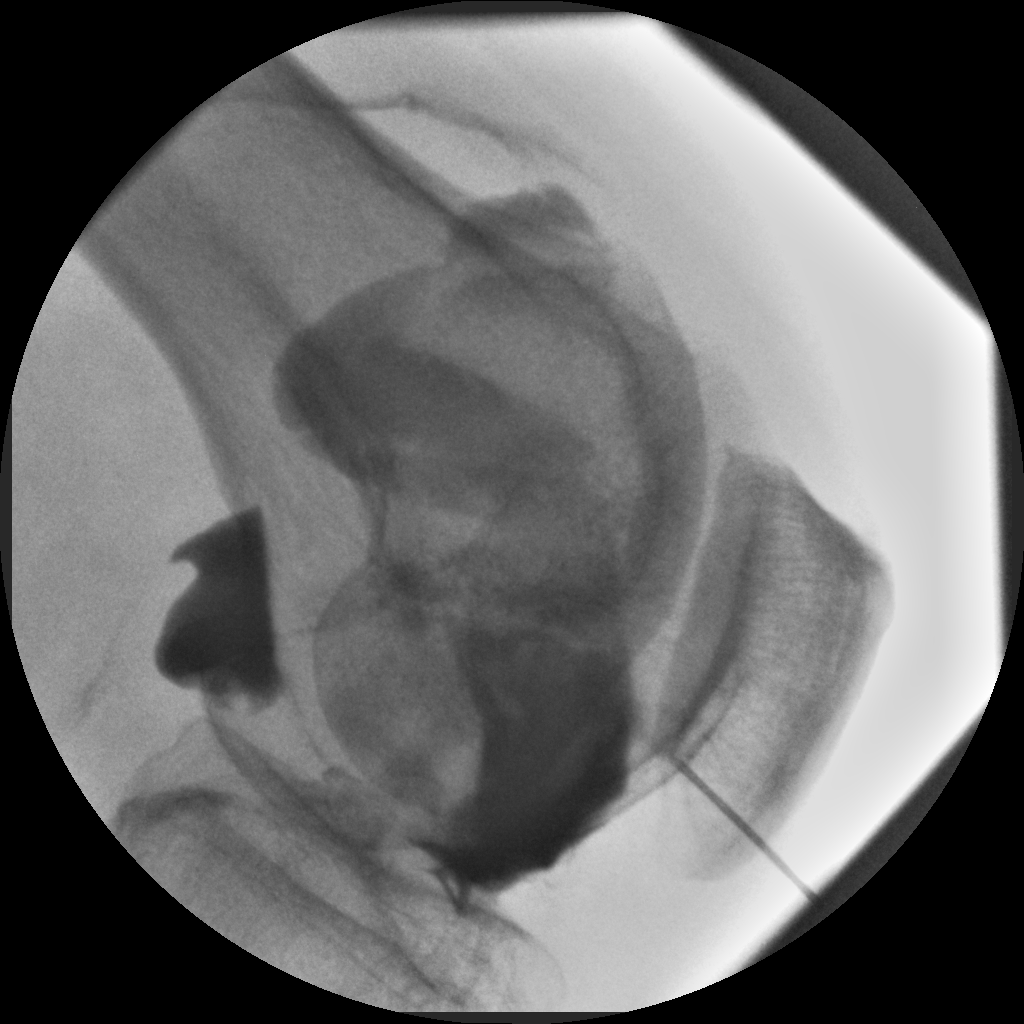

[Series 2: (hospital) · 1 of 1 slices shown (2 of 2)]
[im 1/1]
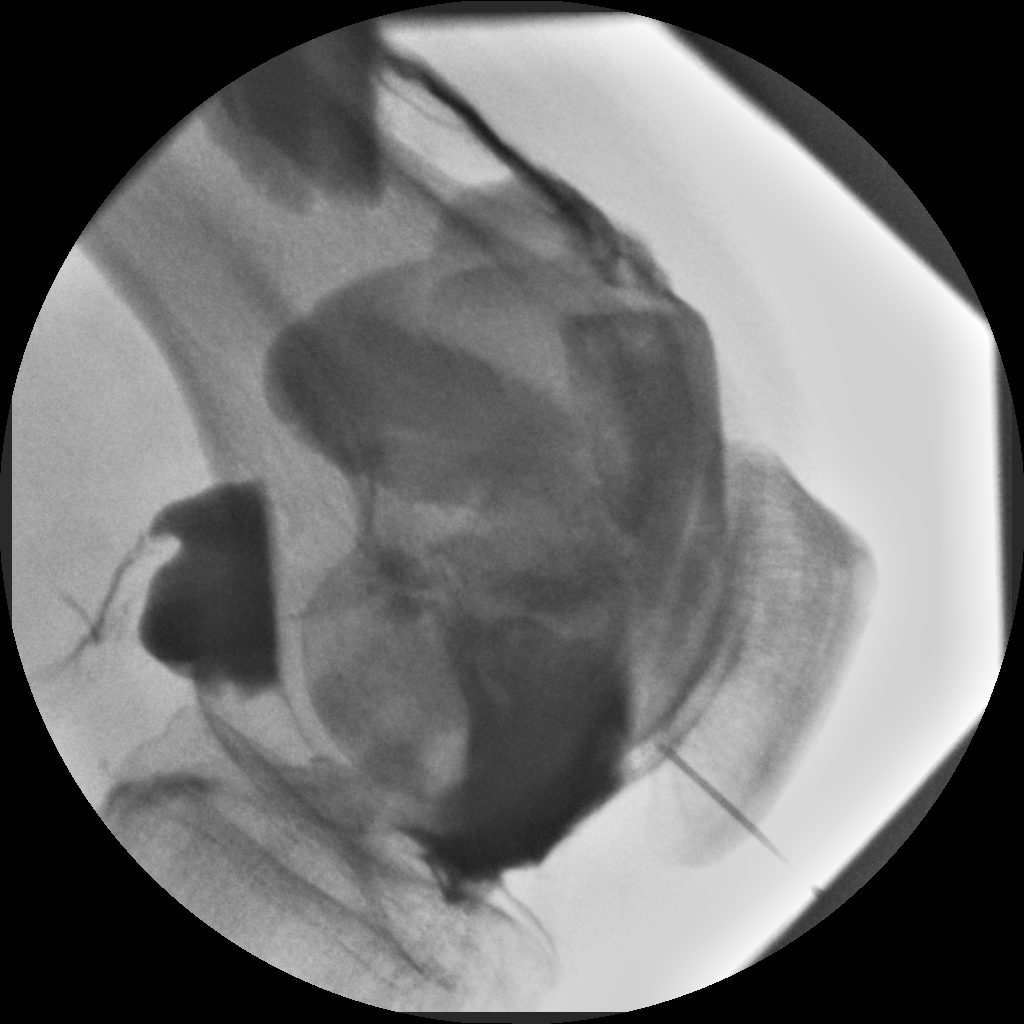

[2 of 2 positions shown; findings below may reference images not displayed]

EXAM:
CT ARTHROGRAM INJECTION OF THE LEFT KNEE

FLUOROSCOPY TIME:  0 min 27 seconds

PROCEDURE:
After a thorough discussion of risks and benefits of the procedure,
written and oral informed consent was obtained. General risks
included bleeding, infection, injury to nerves, blood vessels, and
adjacent structures. Specific risks included extra-articular
injection. Dr. TAPICERSTWO MEBLOWE obtained verbal consent. Localization was
performed over the medial inferior aspect of the LEFT patella
adjacent to the patellar tendon. Sterile prep and drape was
performed. Skin was anesthetized with 1% lidocaine without
epinephrine.

30 ml of iodinated and MRI Gadolinium contrast solution was
injected. Solution was made as follows: 30 ml of Omnipaque 180
contrast agent, 10 ml lidocaine, and 20 ml of sterile saline.

2-1/2 inch 22 gauge spinal needle was advanced into the knee joint
under direct fluoroscopic guidance. Total volume of 30 ml of
contrast injected at which point back pressure was felt and the
injection was discontinued. Adequate filling of the joint was
visible fluoroscopically. Needle was removed and a sterile dressing
was applied. Bandage was applied to the suprapatellar pouch for
imaging with instructions to remove after MRI.

There were no complications and the patient tolerated the procedure
well.
IMPRESSION: Successful LEFT knee CT arthrogram injection.

## 2013-12-09 IMAGING — CT CT KNEE*L* W/CM
1 of 5 series · 3 of 14 positions shown, 4 images · non-contrast
Comparison: None.

ADDENDUM:
Correction: The TECHNIQUE should read: Axial CT images were
performed through the left knee after intraarticular contrast
administration. Multiplanar reconstructions were then performed.
CLINICAL DATA: Pain and swelling.  Meniscal repair in [LI].

EXAM:
CT ARTHROGRAPHY OF THE LEFT KNEE
TECHNIQUE: Multiplanar, multisequence MR imaging of the knee was performed. No
intravenous contrast was administered. Intra-articular contrast was
injected. See injection procedure.

[Series 3: knee detail · axial · 0.31mm/px · z∈[-278,-91]mm · 3 of 76 slices shown, 4 images]
[im 1/76  soft-tissue]
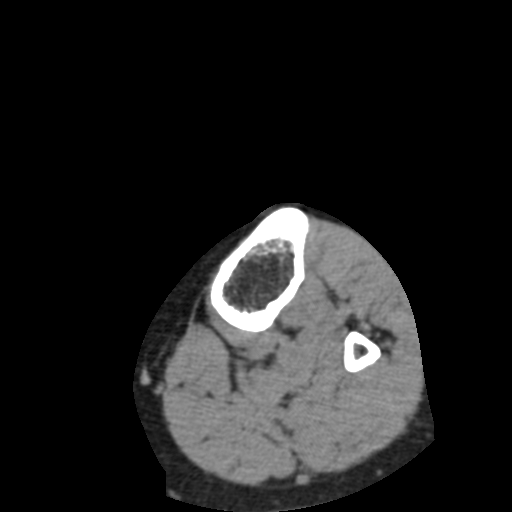
[im 1/76  bone]
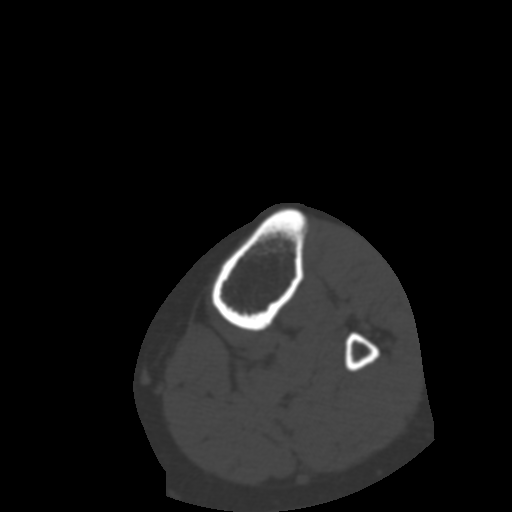
[im 38/76  bone]
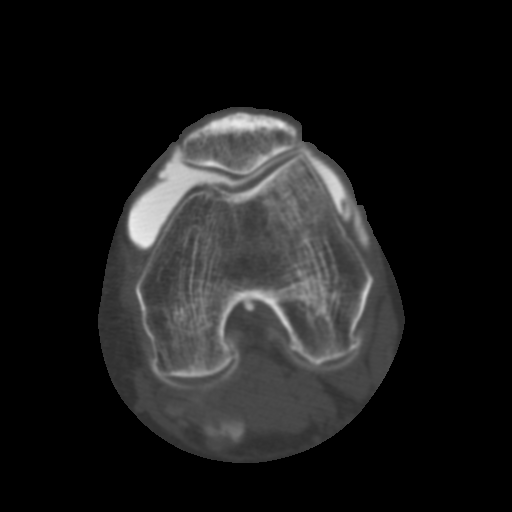
[im 76/76  bone]
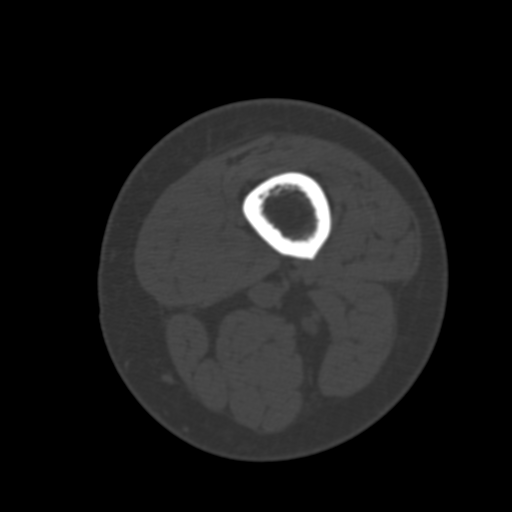

[3 of 14 positions shown; findings below may reference images not displayed]

FINDINGS: MENISCI

Medial meniscus:  Intact.

Lateral meniscus: The meniscus is diminutive at the posterior
lateral corner but is intact.

LIGAMENTS

Cruciates:  Intact.

Collaterals:  Intact.

CARTILAGE

Patellofemoral: Thinning of the cartilage of both facets in of the
patellar apex. Focal 8 mm area of full-thickness cartilage loss of
medial facet of the trochlear groove.

Medial: Irregular thinning on the central portion of the femoral
condyle.

Lateral: Full-thickness cartilage loss of the posterior aspect of
the tibial plateau. Irregular thinning of the posterior central
aspect of the femoral condyle.

Joint: Slight synovial hypertrophy with a thickened suprapatellar
plica.

Popliteal Fossa:  4.6 x 2.4 x 1.3 cm Baker's cyst.

Extensor Mechanism:  Intact.

Bones: Slight osteopenia. Intraosseous ganglion cyst deep to the
tibial insertion of the posterior cruciate ligament. Tiny marginal
osteophytes in the lateral compartment.
IMPRESSION: 1. Tricompartmental chondromalacia.
2. Slight synovial hypertrophy with thickened suprapatellar plica.
3. Intact menisci. The lateral meniscus is diminutive at the
posterior lateral corner.

## 2013-12-09 MED ORDER — IOHEXOL 180 MG/ML  SOLN: 23.0000 mL | Freq: Once | INTRAMUSCULAR | Status: AC | PRN

## 2014-08-03 DIAGNOSIS — G894 Chronic pain syndrome: Secondary | ICD-10-CM | POA: Insufficient documentation

## 2014-08-18 ENCOUNTER — Other Ambulatory Visit: Payer: Self-pay | Admitting: Obstetrics and Gynecology

## 2014-08-19 LAB — CYTOLOGY - PAP

## 2014-11-22 DIAGNOSIS — M5416 Radiculopathy, lumbar region: Secondary | ICD-10-CM | POA: Insufficient documentation

## 2014-11-22 DIAGNOSIS — M5136 Other intervertebral disc degeneration, lumbar region: Secondary | ICD-10-CM | POA: Insufficient documentation

## 2015-10-02 DIAGNOSIS — R5381 Other malaise: Secondary | ICD-10-CM | POA: Diagnosis not present

## 2015-10-03 DIAGNOSIS — E039 Hypothyroidism, unspecified: Secondary | ICD-10-CM | POA: Diagnosis not present

## 2015-10-03 DIAGNOSIS — R6882 Decreased libido: Secondary | ICD-10-CM | POA: Diagnosis not present

## 2015-10-03 DIAGNOSIS — N959 Unspecified menopausal and perimenopausal disorder: Secondary | ICD-10-CM | POA: Diagnosis not present

## 2015-10-03 DIAGNOSIS — R5381 Other malaise: Secondary | ICD-10-CM | POA: Diagnosis not present

## 2015-10-12 DIAGNOSIS — M2242 Chondromalacia patellae, left knee: Secondary | ICD-10-CM | POA: Diagnosis not present

## 2015-10-12 DIAGNOSIS — Z681 Body mass index (BMI) 19 or less, adult: Secondary | ICD-10-CM | POA: Diagnosis not present

## 2015-10-12 DIAGNOSIS — Z01419 Encounter for gynecological examination (general) (routine) without abnormal findings: Secondary | ICD-10-CM | POA: Diagnosis not present

## 2015-10-12 DIAGNOSIS — M75101 Unspecified rotator cuff tear or rupture of right shoulder, not specified as traumatic: Secondary | ICD-10-CM | POA: Diagnosis not present

## 2015-10-12 DIAGNOSIS — Z13820 Encounter for screening for osteoporosis: Secondary | ICD-10-CM | POA: Diagnosis not present

## 2015-10-12 DIAGNOSIS — Z1231 Encounter for screening mammogram for malignant neoplasm of breast: Secondary | ICD-10-CM | POA: Diagnosis not present

## 2015-10-16 DIAGNOSIS — L659 Nonscarring hair loss, unspecified: Secondary | ICD-10-CM | POA: Diagnosis not present

## 2015-10-16 DIAGNOSIS — E039 Hypothyroidism, unspecified: Secondary | ICD-10-CM | POA: Diagnosis not present

## 2015-10-16 DIAGNOSIS — D518 Other vitamin B12 deficiency anemias: Secondary | ICD-10-CM | POA: Diagnosis not present

## 2015-10-16 DIAGNOSIS — N951 Menopausal and female climacteric states: Secondary | ICD-10-CM | POA: Diagnosis not present

## 2015-11-21 DIAGNOSIS — R102 Pelvic and perineal pain: Secondary | ICD-10-CM | POA: Diagnosis not present

## 2016-01-17 DIAGNOSIS — D225 Melanocytic nevi of trunk: Secondary | ICD-10-CM | POA: Diagnosis not present

## 2016-01-17 DIAGNOSIS — L821 Other seborrheic keratosis: Secondary | ICD-10-CM | POA: Diagnosis not present

## 2016-01-17 DIAGNOSIS — L570 Actinic keratosis: Secondary | ICD-10-CM | POA: Diagnosis not present

## 2016-01-17 DIAGNOSIS — Z85828 Personal history of other malignant neoplasm of skin: Secondary | ICD-10-CM | POA: Diagnosis not present

## 2016-01-17 DIAGNOSIS — D2271 Melanocytic nevi of right lower limb, including hip: Secondary | ICD-10-CM | POA: Diagnosis not present

## 2016-01-23 DIAGNOSIS — E039 Hypothyroidism, unspecified: Secondary | ICD-10-CM | POA: Diagnosis not present

## 2016-01-23 DIAGNOSIS — N951 Menopausal and female climacteric states: Secondary | ICD-10-CM | POA: Diagnosis not present

## 2016-01-23 DIAGNOSIS — Z23 Encounter for immunization: Secondary | ICD-10-CM | POA: Diagnosis not present

## 2016-01-23 DIAGNOSIS — M542 Cervicalgia: Secondary | ICD-10-CM | POA: Diagnosis not present

## 2016-01-23 DIAGNOSIS — M62838 Other muscle spasm: Secondary | ICD-10-CM | POA: Diagnosis not present

## 2016-04-04 DIAGNOSIS — R5383 Other fatigue: Secondary | ICD-10-CM | POA: Diagnosis not present

## 2016-04-04 DIAGNOSIS — Z0000 Encounter for general adult medical examination without abnormal findings: Secondary | ICD-10-CM | POA: Diagnosis not present

## 2016-04-04 DIAGNOSIS — D539 Nutritional anemia, unspecified: Secondary | ICD-10-CM | POA: Diagnosis not present

## 2016-04-04 DIAGNOSIS — L272 Dermatitis due to ingested food: Secondary | ICD-10-CM | POA: Diagnosis not present

## 2016-04-04 DIAGNOSIS — E079 Disorder of thyroid, unspecified: Secondary | ICD-10-CM | POA: Diagnosis not present

## 2016-04-04 DIAGNOSIS — E639 Nutritional deficiency, unspecified: Secondary | ICD-10-CM | POA: Diagnosis not present

## 2016-04-04 DIAGNOSIS — E349 Endocrine disorder, unspecified: Secondary | ICD-10-CM | POA: Diagnosis not present

## 2016-04-04 DIAGNOSIS — R5381 Other malaise: Secondary | ICD-10-CM | POA: Diagnosis not present

## 2016-04-30 DIAGNOSIS — M9901 Segmental and somatic dysfunction of cervical region: Secondary | ICD-10-CM | POA: Diagnosis not present

## 2016-04-30 DIAGNOSIS — M4312 Spondylolisthesis, cervical region: Secondary | ICD-10-CM | POA: Diagnosis not present

## 2016-05-23 DIAGNOSIS — M4602 Spinal enthesopathy, cervical region: Secondary | ICD-10-CM | POA: Diagnosis not present

## 2016-05-23 DIAGNOSIS — M47812 Spondylosis without myelopathy or radiculopathy, cervical region: Secondary | ICD-10-CM | POA: Diagnosis not present

## 2016-05-23 DIAGNOSIS — M542 Cervicalgia: Secondary | ICD-10-CM | POA: Diagnosis not present

## 2016-06-28 DIAGNOSIS — M4602 Spinal enthesopathy, cervical region: Secondary | ICD-10-CM | POA: Diagnosis not present

## 2016-06-28 DIAGNOSIS — M542 Cervicalgia: Secondary | ICD-10-CM | POA: Diagnosis not present

## 2016-07-03 DIAGNOSIS — M4312 Spondylolisthesis, cervical region: Secondary | ICD-10-CM | POA: Diagnosis not present

## 2016-07-03 DIAGNOSIS — M47812 Spondylosis without myelopathy or radiculopathy, cervical region: Secondary | ICD-10-CM | POA: Diagnosis not present

## 2016-07-09 DIAGNOSIS — L820 Inflamed seborrheic keratosis: Secondary | ICD-10-CM | POA: Diagnosis not present

## 2016-07-09 DIAGNOSIS — L814 Other melanin hyperpigmentation: Secondary | ICD-10-CM | POA: Diagnosis not present

## 2016-07-09 DIAGNOSIS — D2262 Melanocytic nevi of left upper limb, including shoulder: Secondary | ICD-10-CM | POA: Diagnosis not present

## 2016-07-09 DIAGNOSIS — L538 Other specified erythematous conditions: Secondary | ICD-10-CM | POA: Diagnosis not present

## 2016-07-09 DIAGNOSIS — L570 Actinic keratosis: Secondary | ICD-10-CM | POA: Diagnosis not present

## 2016-08-12 DIAGNOSIS — M47812 Spondylosis without myelopathy or radiculopathy, cervical region: Secondary | ICD-10-CM | POA: Diagnosis not present

## 2016-08-12 DIAGNOSIS — M542 Cervicalgia: Secondary | ICD-10-CM | POA: Diagnosis not present

## 2016-08-23 DIAGNOSIS — M542 Cervicalgia: Secondary | ICD-10-CM | POA: Diagnosis not present

## 2016-08-23 DIAGNOSIS — M47812 Spondylosis without myelopathy or radiculopathy, cervical region: Secondary | ICD-10-CM | POA: Diagnosis not present

## 2016-09-03 DIAGNOSIS — M542 Cervicalgia: Secondary | ICD-10-CM | POA: Diagnosis not present

## 2016-09-03 DIAGNOSIS — M47812 Spondylosis without myelopathy or radiculopathy, cervical region: Secondary | ICD-10-CM | POA: Diagnosis not present

## 2016-09-19 DIAGNOSIS — M542 Cervicalgia: Secondary | ICD-10-CM | POA: Diagnosis not present

## 2016-09-19 DIAGNOSIS — M47812 Spondylosis without myelopathy or radiculopathy, cervical region: Secondary | ICD-10-CM | POA: Diagnosis not present

## 2016-10-08 DIAGNOSIS — M542 Cervicalgia: Secondary | ICD-10-CM | POA: Diagnosis not present

## 2016-10-08 DIAGNOSIS — M47812 Spondylosis without myelopathy or radiculopathy, cervical region: Secondary | ICD-10-CM | POA: Diagnosis not present

## 2016-10-29 DIAGNOSIS — Z0000 Encounter for general adult medical examination without abnormal findings: Secondary | ICD-10-CM | POA: Diagnosis not present

## 2016-10-29 DIAGNOSIS — Z13220 Encounter for screening for lipoid disorders: Secondary | ICD-10-CM | POA: Diagnosis not present

## 2016-11-12 DIAGNOSIS — Z681 Body mass index (BMI) 19 or less, adult: Secondary | ICD-10-CM | POA: Diagnosis not present

## 2016-11-12 DIAGNOSIS — Z01419 Encounter for gynecological examination (general) (routine) without abnormal findings: Secondary | ICD-10-CM | POA: Diagnosis not present

## 2016-11-12 DIAGNOSIS — Z1231 Encounter for screening mammogram for malignant neoplasm of breast: Secondary | ICD-10-CM | POA: Diagnosis not present

## 2016-11-14 ENCOUNTER — Other Ambulatory Visit: Payer: Self-pay | Admitting: Obstetrics and Gynecology

## 2016-11-14 DIAGNOSIS — R928 Other abnormal and inconclusive findings on diagnostic imaging of breast: Secondary | ICD-10-CM

## 2016-11-18 ENCOUNTER — Ambulatory Visit: Admission: RE | Admit: 2016-11-18 | Payer: Self-pay | Source: Ambulatory Visit

## 2016-11-18 ENCOUNTER — Ambulatory Visit
Admission: RE | Admit: 2016-11-18 | Discharge: 2016-11-18 | Disposition: A | Discharge status: Home / Self Care | Payer: BLUE CROSS/BLUE SHIELD | Source: Ambulatory Visit | Attending: Obstetrics and Gynecology | Admitting: Obstetrics and Gynecology

## 2016-11-18 DIAGNOSIS — R928 Other abnormal and inconclusive findings on diagnostic imaging of breast: Secondary | ICD-10-CM

## 2016-11-18 DIAGNOSIS — R921 Mammographic calcification found on diagnostic imaging of breast: Secondary | ICD-10-CM | POA: Diagnosis not present

## 2016-11-18 IMAGING — MG 2D DIGITAL DIAGNOSTIC BILATERAL MAMMOGRAM WITH CAD AND ADJUNCT T
8 of 11 series · 8 of 19 positions shown · non-contrast
Comparison: Previous exam(s).

CLINICAL DATA: Screening recall for possible left breast distortion
and right breast calcifications. The patient has had history of
excisional biopsy of calcifications in the right breast in [1W] with
results indicating fibrocystic changes and focal usual ductal
hyperplasia.

EXAM:
2D DIGITAL DIAGNOSTIC BILATERAL MAMMOGRAM WITH CAD AND ADJUNCT TOMO

[R XCCL (1 of 3)]
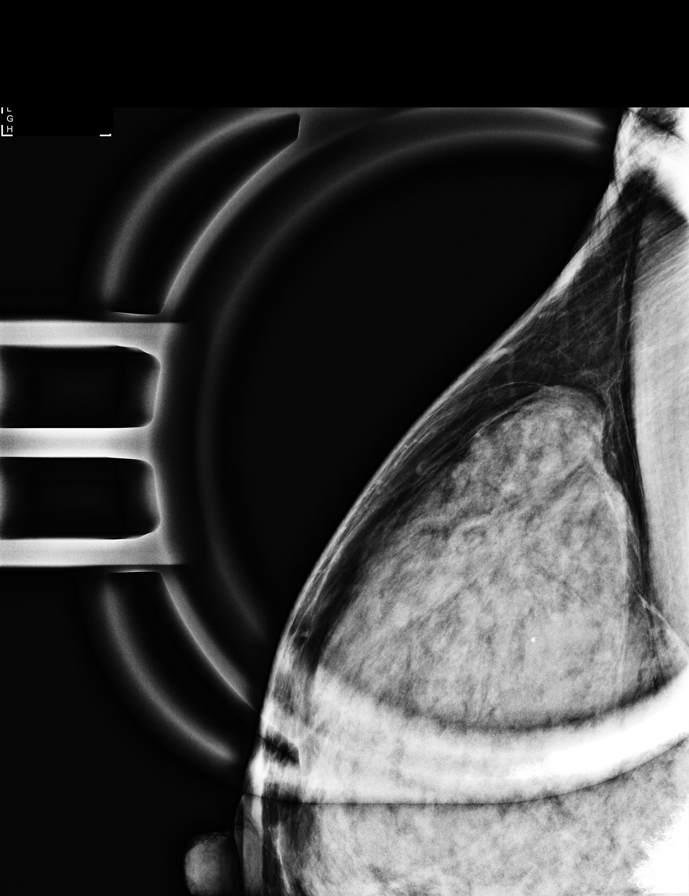

[R XCCL (2 of 3)]
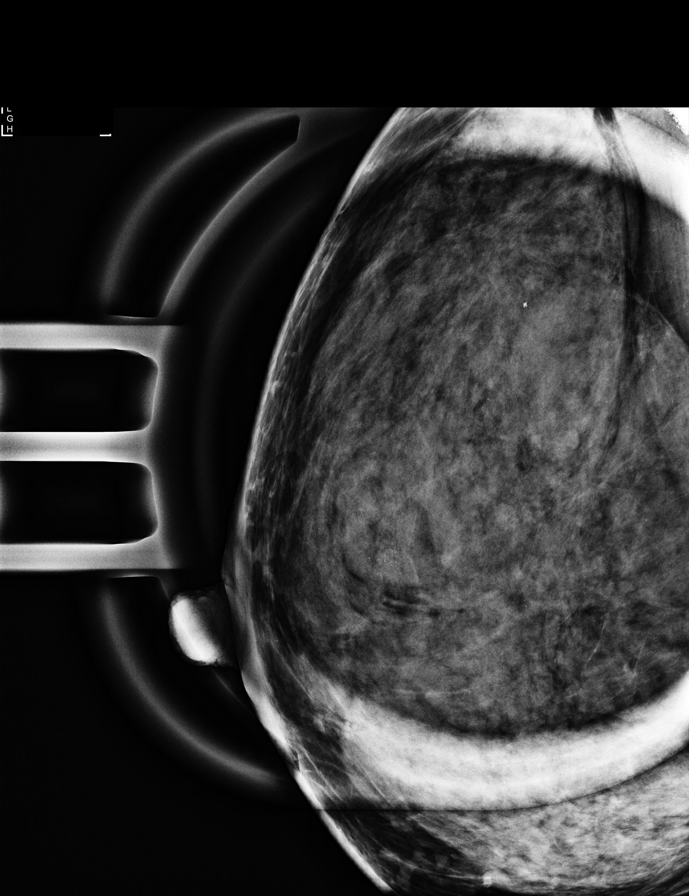

[R XCCL (3 of 3)]
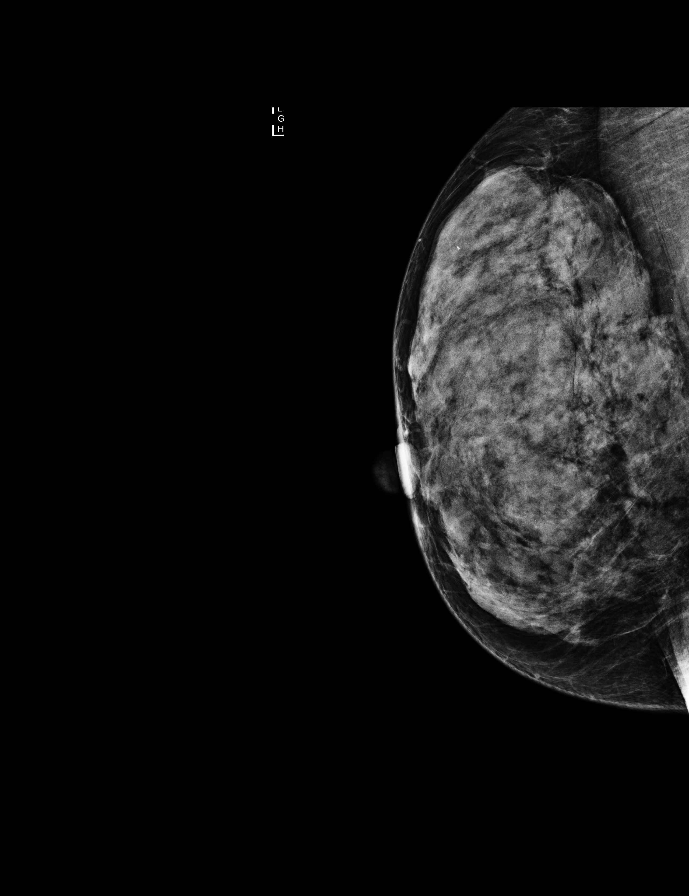

[R ML (1 of 2)]
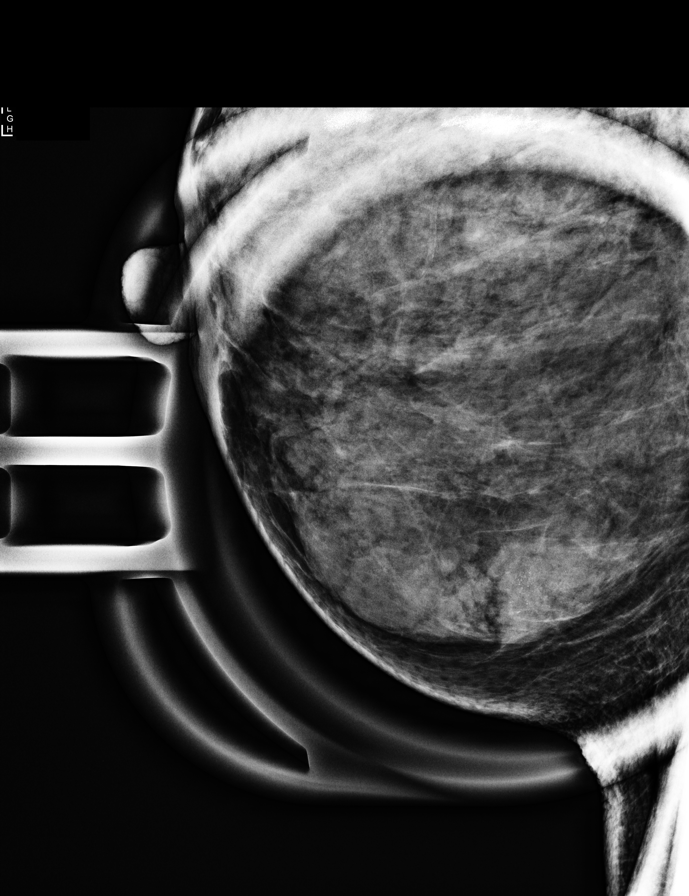

[R ML (2 of 2)]
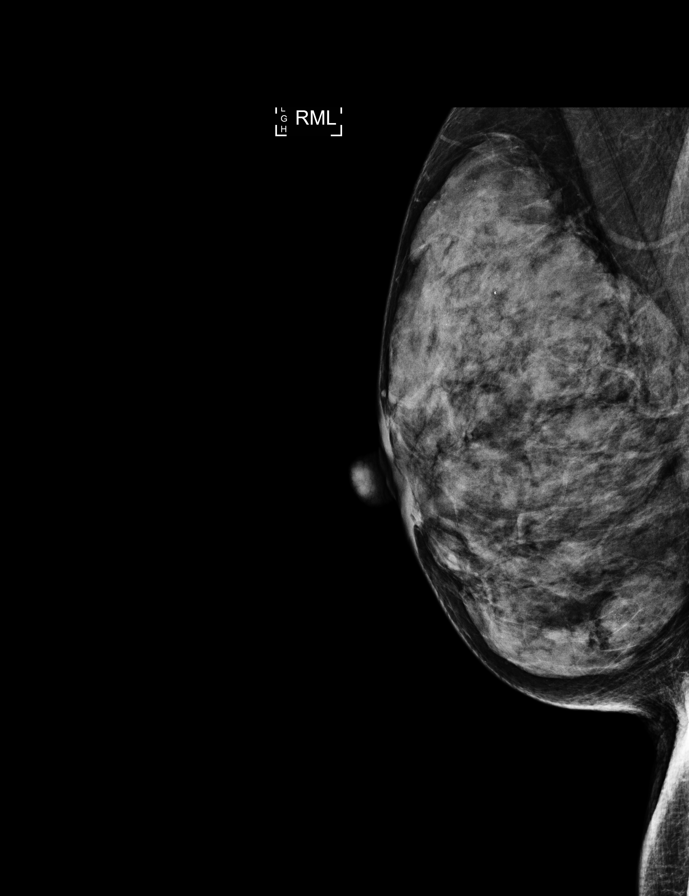

[L CC synth-2D]
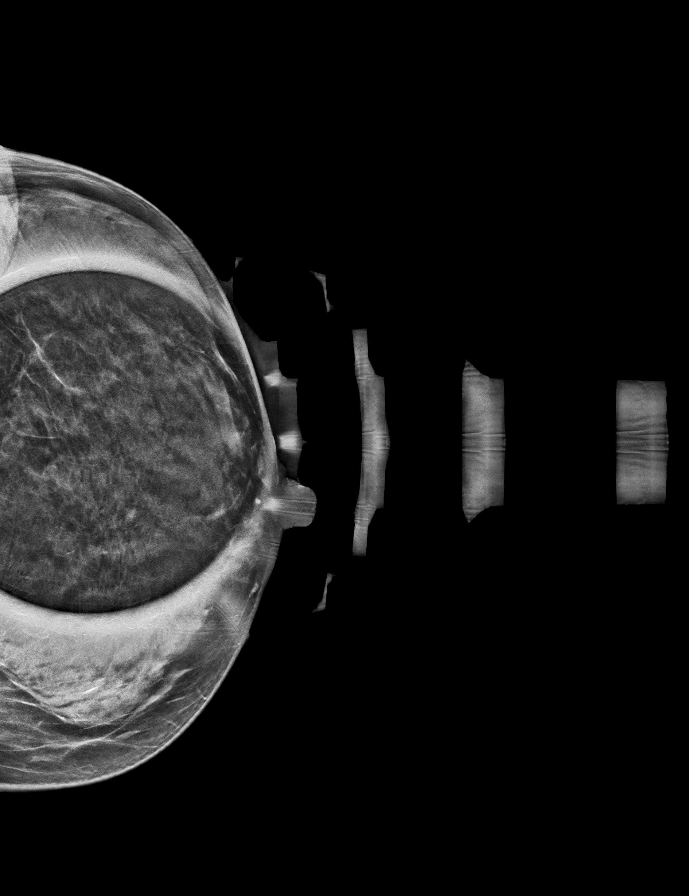

[L CC]
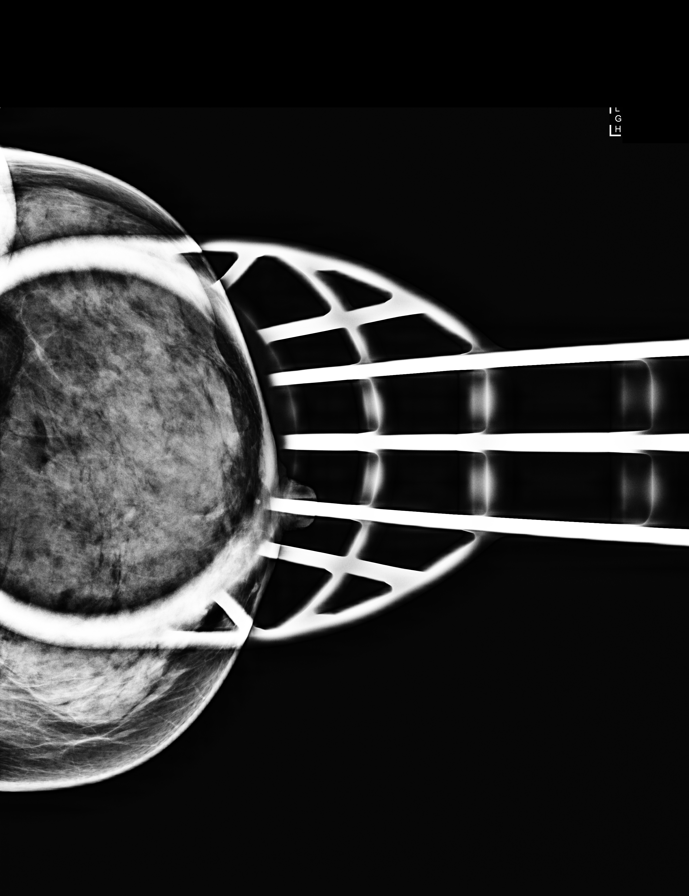

[L ML]
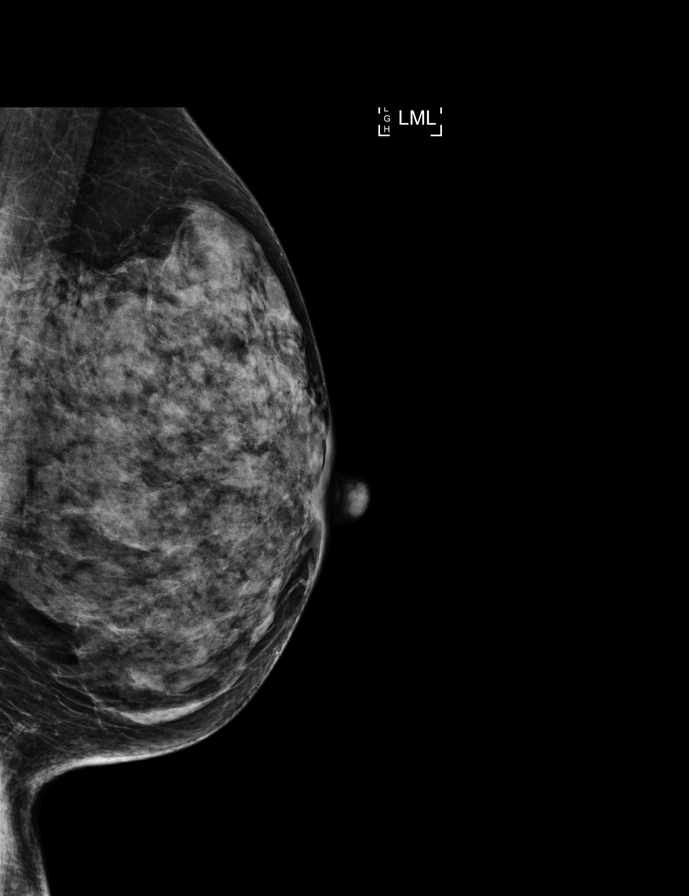

[8 of 19 positions shown; findings below may reference images not displayed]

ACR Breast Density Category d: The breast tissue is extremely dense,
which lowers the sensitivity of mammography.
FINDINGS: The questioned distortion in the left breast resolves with
additional spot compression tomosynthesis imaging consistent with
overlapping fibroglandular tissue. In the lower outer quadrant of
the right breast, far posterior depth in a similar location as the
of punctate calcifications. These are similar morphology as the
previously excised benign calcifications.

Mammographic images were processed with CAD.
IMPRESSION: 1. The calcifications in the lower outer quadrant of the right
breast are probably benign, similar in location and morphology as
the previously excised benign calcifications.

2. Resolution of the questioned distortion in the left breast
consistent with overlapping fibroglandular tissue.

RECOMMENDATION:
Six-month follow-up diagnostic right breast mammogram is
recommended.

I have discussed the findings and recommendations with the patient.
Results were also provided in writing at the conclusion of the
visit. If applicable, a reminder letter will be sent to the patient
regarding the next appointment.

BI-RADS CATEGORY  3: Probably benign.

## 2016-11-21 DIAGNOSIS — E039 Hypothyroidism, unspecified: Secondary | ICD-10-CM | POA: Diagnosis not present

## 2016-11-21 DIAGNOSIS — N951 Menopausal and female climacteric states: Secondary | ICD-10-CM | POA: Diagnosis not present

## 2016-11-21 DIAGNOSIS — E538 Deficiency of other specified B group vitamins: Secondary | ICD-10-CM | POA: Diagnosis not present

## 2016-11-21 DIAGNOSIS — E559 Vitamin D deficiency, unspecified: Secondary | ICD-10-CM | POA: Diagnosis not present

## 2016-11-21 DIAGNOSIS — R5383 Other fatigue: Secondary | ICD-10-CM | POA: Diagnosis not present

## 2016-11-28 DIAGNOSIS — M47812 Spondylosis without myelopathy or radiculopathy, cervical region: Secondary | ICD-10-CM | POA: Diagnosis not present

## 2016-11-28 DIAGNOSIS — M542 Cervicalgia: Secondary | ICD-10-CM | POA: Diagnosis not present

## 2016-12-04 DIAGNOSIS — E039 Hypothyroidism, unspecified: Secondary | ICD-10-CM | POA: Diagnosis not present

## 2016-12-04 DIAGNOSIS — N951 Menopausal and female climacteric states: Secondary | ICD-10-CM | POA: Diagnosis not present

## 2016-12-04 DIAGNOSIS — R6882 Decreased libido: Secondary | ICD-10-CM | POA: Diagnosis not present

## 2017-01-14 DIAGNOSIS — D485 Neoplasm of uncertain behavior of skin: Secondary | ICD-10-CM | POA: Diagnosis not present

## 2017-01-14 DIAGNOSIS — Z85828 Personal history of other malignant neoplasm of skin: Secondary | ICD-10-CM | POA: Diagnosis not present

## 2017-01-14 DIAGNOSIS — L570 Actinic keratosis: Secondary | ICD-10-CM | POA: Diagnosis not present

## 2017-01-14 DIAGNOSIS — L430 Hypertrophic lichen planus: Secondary | ICD-10-CM | POA: Diagnosis not present

## 2017-01-14 DIAGNOSIS — D225 Melanocytic nevi of trunk: Secondary | ICD-10-CM | POA: Diagnosis not present

## 2017-01-14 DIAGNOSIS — Z23 Encounter for immunization: Secondary | ICD-10-CM | POA: Diagnosis not present

## 2017-01-14 DIAGNOSIS — C44612 Basal cell carcinoma of skin of right upper limb, including shoulder: Secondary | ICD-10-CM | POA: Diagnosis not present

## 2017-01-17 DIAGNOSIS — N951 Menopausal and female climacteric states: Secondary | ICD-10-CM | POA: Diagnosis not present

## 2017-01-17 DIAGNOSIS — R5383 Other fatigue: Secondary | ICD-10-CM | POA: Diagnosis not present

## 2017-01-31 ENCOUNTER — Other Ambulatory Visit: Payer: Self-pay | Admitting: Obstetrics and Gynecology

## 2017-01-31 DIAGNOSIS — R921 Mammographic calcification found on diagnostic imaging of breast: Secondary | ICD-10-CM

## 2017-03-05 DIAGNOSIS — L858 Other specified epidermal thickening: Secondary | ICD-10-CM | POA: Diagnosis not present

## 2017-03-05 DIAGNOSIS — C44519 Basal cell carcinoma of skin of other part of trunk: Secondary | ICD-10-CM | POA: Diagnosis not present

## 2017-03-05 DIAGNOSIS — D485 Neoplasm of uncertain behavior of skin: Secondary | ICD-10-CM | POA: Diagnosis not present

## 2017-03-12 DIAGNOSIS — J329 Chronic sinusitis, unspecified: Secondary | ICD-10-CM | POA: Diagnosis not present

## 2017-04-08 DIAGNOSIS — N951 Menopausal and female climacteric states: Secondary | ICD-10-CM | POA: Diagnosis not present

## 2017-04-16 DIAGNOSIS — N951 Menopausal and female climacteric states: Secondary | ICD-10-CM | POA: Diagnosis not present

## 2017-07-14 DIAGNOSIS — L570 Actinic keratosis: Secondary | ICD-10-CM | POA: Diagnosis not present

## 2017-07-14 DIAGNOSIS — X32XXXA Exposure to sunlight, initial encounter: Secondary | ICD-10-CM | POA: Diagnosis not present

## 2017-07-14 DIAGNOSIS — L814 Other melanin hyperpigmentation: Secondary | ICD-10-CM | POA: Diagnosis not present

## 2017-07-22 ENCOUNTER — Other Ambulatory Visit: Payer: Self-pay | Admitting: Obstetrics and Gynecology

## 2017-07-22 ENCOUNTER — Ambulatory Visit
Admission: RE | Admit: 2017-07-22 | Discharge: 2017-07-22 | Disposition: A | Discharge status: Home / Self Care | Payer: BLUE CROSS/BLUE SHIELD | Source: Ambulatory Visit | Attending: Obstetrics and Gynecology | Admitting: Obstetrics and Gynecology

## 2017-07-22 DIAGNOSIS — R921 Mammographic calcification found on diagnostic imaging of breast: Secondary | ICD-10-CM

## 2017-07-22 IMAGING — MG DIGITAL DIAGNOSTIC UNILATERAL RIGHT MAMMOGRAM WITH TOMO AND CAD
7 series · 8 of 15 positions shown · non-contrast
Comparison: Previous exam(s).

ADDENDUM:
The results and recommendations were discussed with the patient
immediately following the exam. The patient has had a stressful
experience with a prior stereotactic core needle biopsy, which
ultimately was unsuccessful, and therefore she would like an
alternative testing. Given this, contrast-enhanced breast MRI was
offered to the patient as an alternative to stereotactic core needle
biopsy, keeping in mind that it may or may not provide a definitive
answer as to whether the calcifications in the lower right breasts
are malignant or benign.

The patient expresses full understanding, and likes to proceed with
breast MRI, if possible.
CLINICAL DATA: Follow-up of probably benign right breast
calcifications.
EXAM:
DIGITAL DIAGNOSTIC UNILATERAL RIGHT MAMMOGRAM WITH CAD AND TOMO

[R ML]
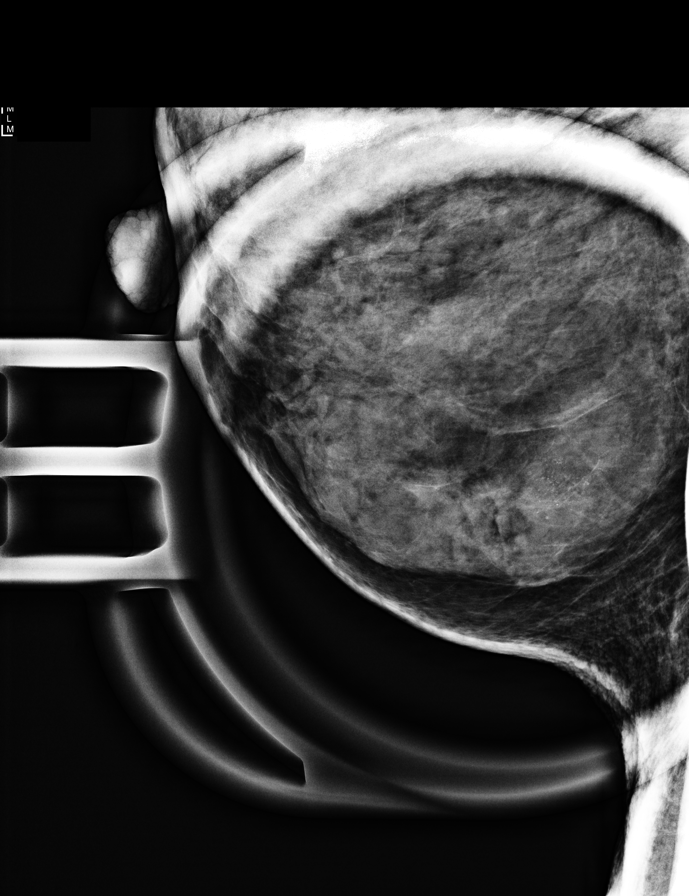

[R CC]
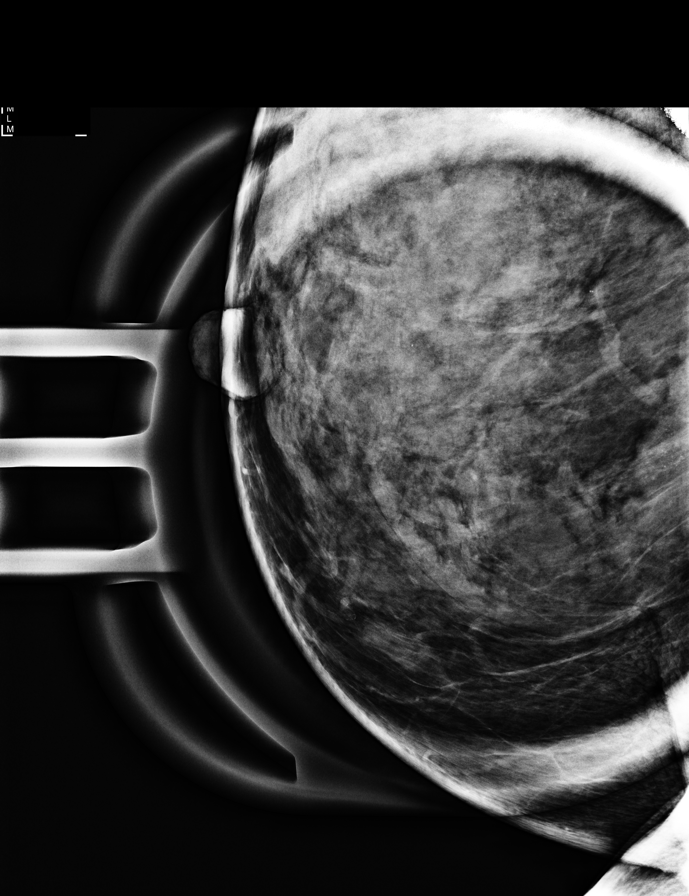

[R XCCL]
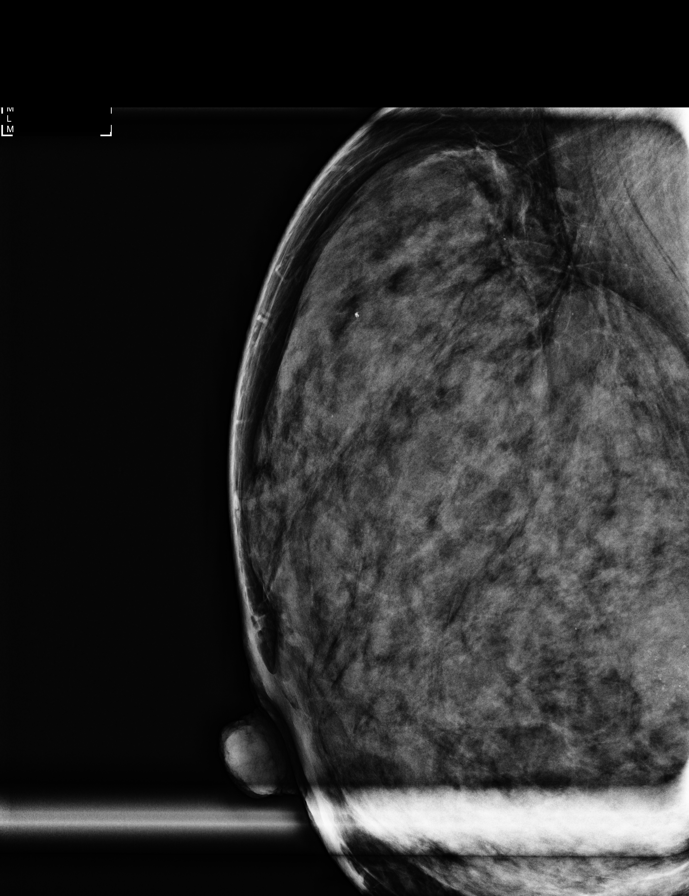

[R MLO synth-2D]
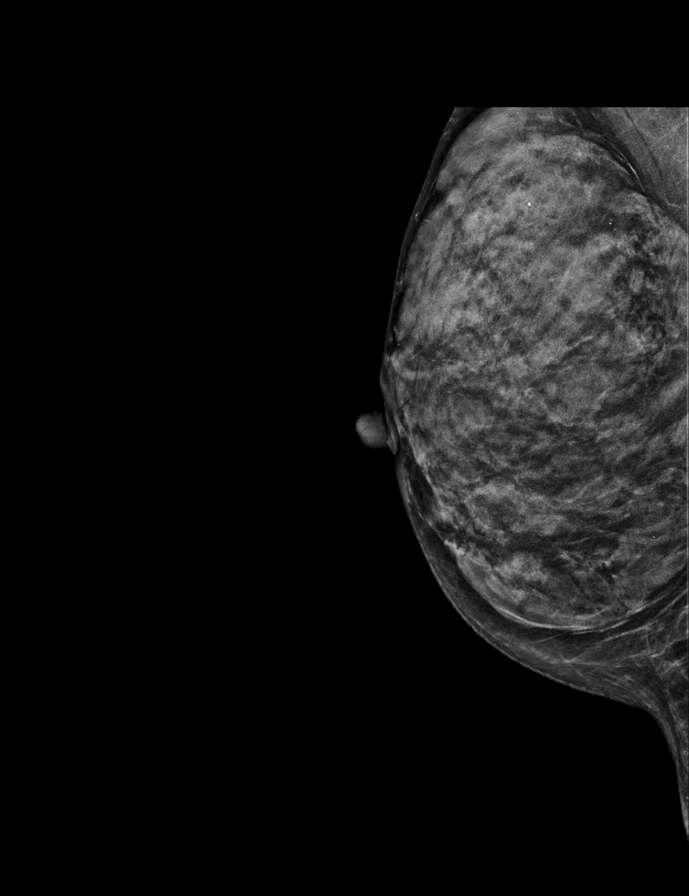

[R CC synth-2D]
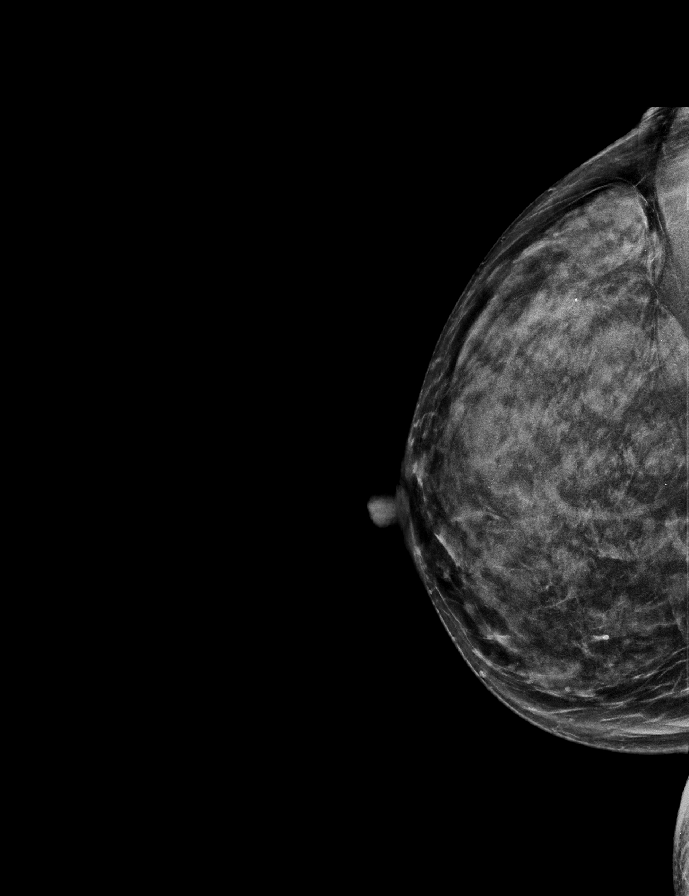

[R CC tomo · 2 of 51 frames shown]
[frame 17/51]
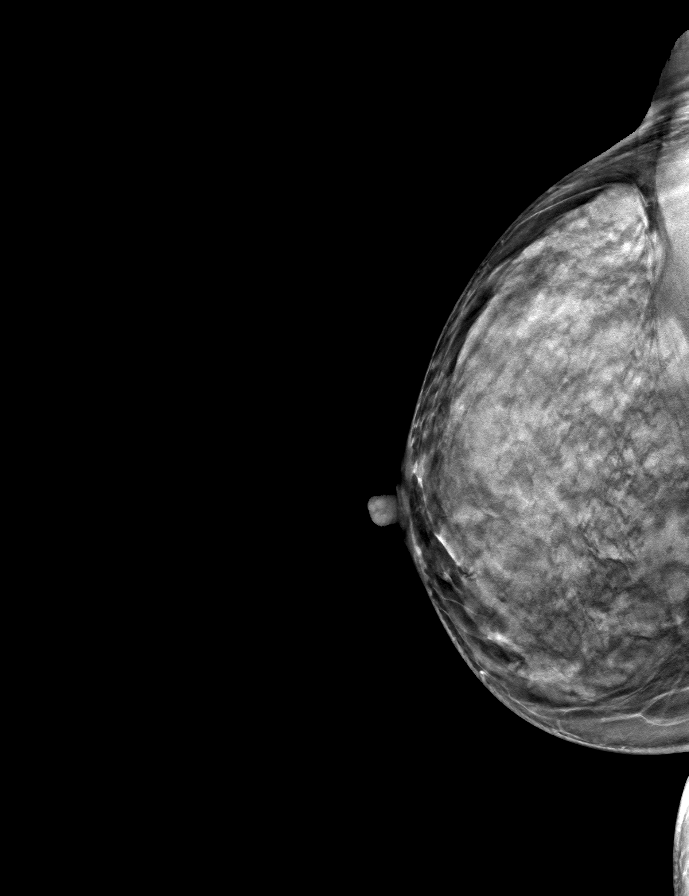
[frame 26/51]
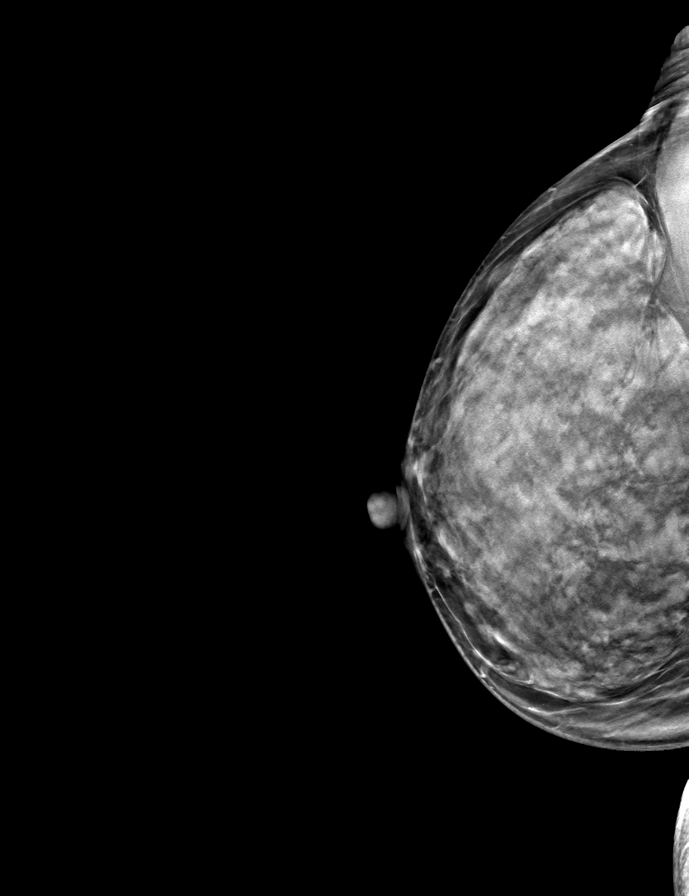

[R MLO tomo · tomo slice 22/43.0]
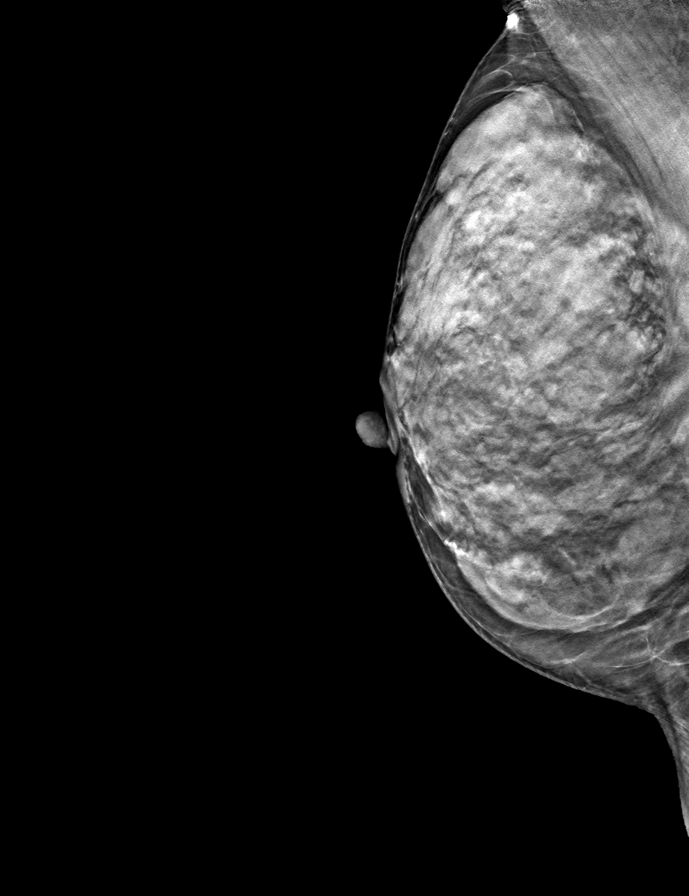

[8 of 15 positions shown; findings below may reference images not displayed]

ACR Breast Density Category d: The breast tissue is extremely dense,
which lowers the sensitivity of mammography.
FINDINGS: Standard and compression magnification views of the right breast
demonstrate no suspicious masses or areas of architectural
distortion. There is a group of indeterminate calcification in the
right breast lower slightly inner quadrant, far posterior depth,
which measure 1.1 x 1.2x 1.5 cm.

Mammographic images were processed with CAD.
IMPRESSION: Right breast lower slightly inner quadrant indeterminate
calcifications, for which stereotactic core needle biopsy is
recommended.

RECOMMENDATION:
Stereotactic core needle biopsy of the right breast.

I have discussed the findings and recommendations with the patient.
Results were also provided in writing at the conclusion of the
visit. If applicable, a reminder letter will be sent to the patient
regarding the next appointment.

BI-RADS CATEGORY  4: Suspicious.

## 2017-07-23 ENCOUNTER — Other Ambulatory Visit: Payer: Self-pay | Admitting: Obstetrics and Gynecology

## 2017-07-23 DIAGNOSIS — R928 Other abnormal and inconclusive findings on diagnostic imaging of breast: Secondary | ICD-10-CM

## 2017-07-29 ENCOUNTER — Other Ambulatory Visit: Payer: Self-pay

## 2017-07-30 ENCOUNTER — Other Ambulatory Visit: Payer: BLUE CROSS/BLUE SHIELD

## 2017-08-05 DIAGNOSIS — E039 Hypothyroidism, unspecified: Secondary | ICD-10-CM | POA: Diagnosis not present

## 2017-08-05 DIAGNOSIS — E559 Vitamin D deficiency, unspecified: Secondary | ICD-10-CM | POA: Diagnosis not present

## 2017-08-05 DIAGNOSIS — N951 Menopausal and female climacteric states: Secondary | ICD-10-CM | POA: Diagnosis not present

## 2017-08-05 DIAGNOSIS — E538 Deficiency of other specified B group vitamins: Secondary | ICD-10-CM | POA: Diagnosis not present

## 2017-08-06 ENCOUNTER — Other Ambulatory Visit: Payer: BLUE CROSS/BLUE SHIELD

## 2017-08-07 ENCOUNTER — Ambulatory Visit
Admission: RE | Admit: 2017-08-07 | Discharge: 2017-08-07 | Disposition: A | Discharge status: Home / Self Care | Payer: BLUE CROSS/BLUE SHIELD | Source: Ambulatory Visit | Attending: Obstetrics and Gynecology | Admitting: Obstetrics and Gynecology

## 2017-08-07 DIAGNOSIS — R921 Mammographic calcification found on diagnostic imaging of breast: Secondary | ICD-10-CM

## 2017-08-07 DIAGNOSIS — N6011 Diffuse cystic mastopathy of right breast: Secondary | ICD-10-CM | POA: Diagnosis not present

## 2017-08-07 IMAGING — MG MM CLIP PLACEMENT
2 series · 2 of 2 positions shown · non-contrast
Comparison: Previous exam(s).

CLINICAL DATA: Status post stereotactic guided biopsy right breast
calcifications.

EXAM:
DIAGNOSTIC RIGHT MAMMOGRAM POST STEREOTACTIC BIOPSY

[R LM]
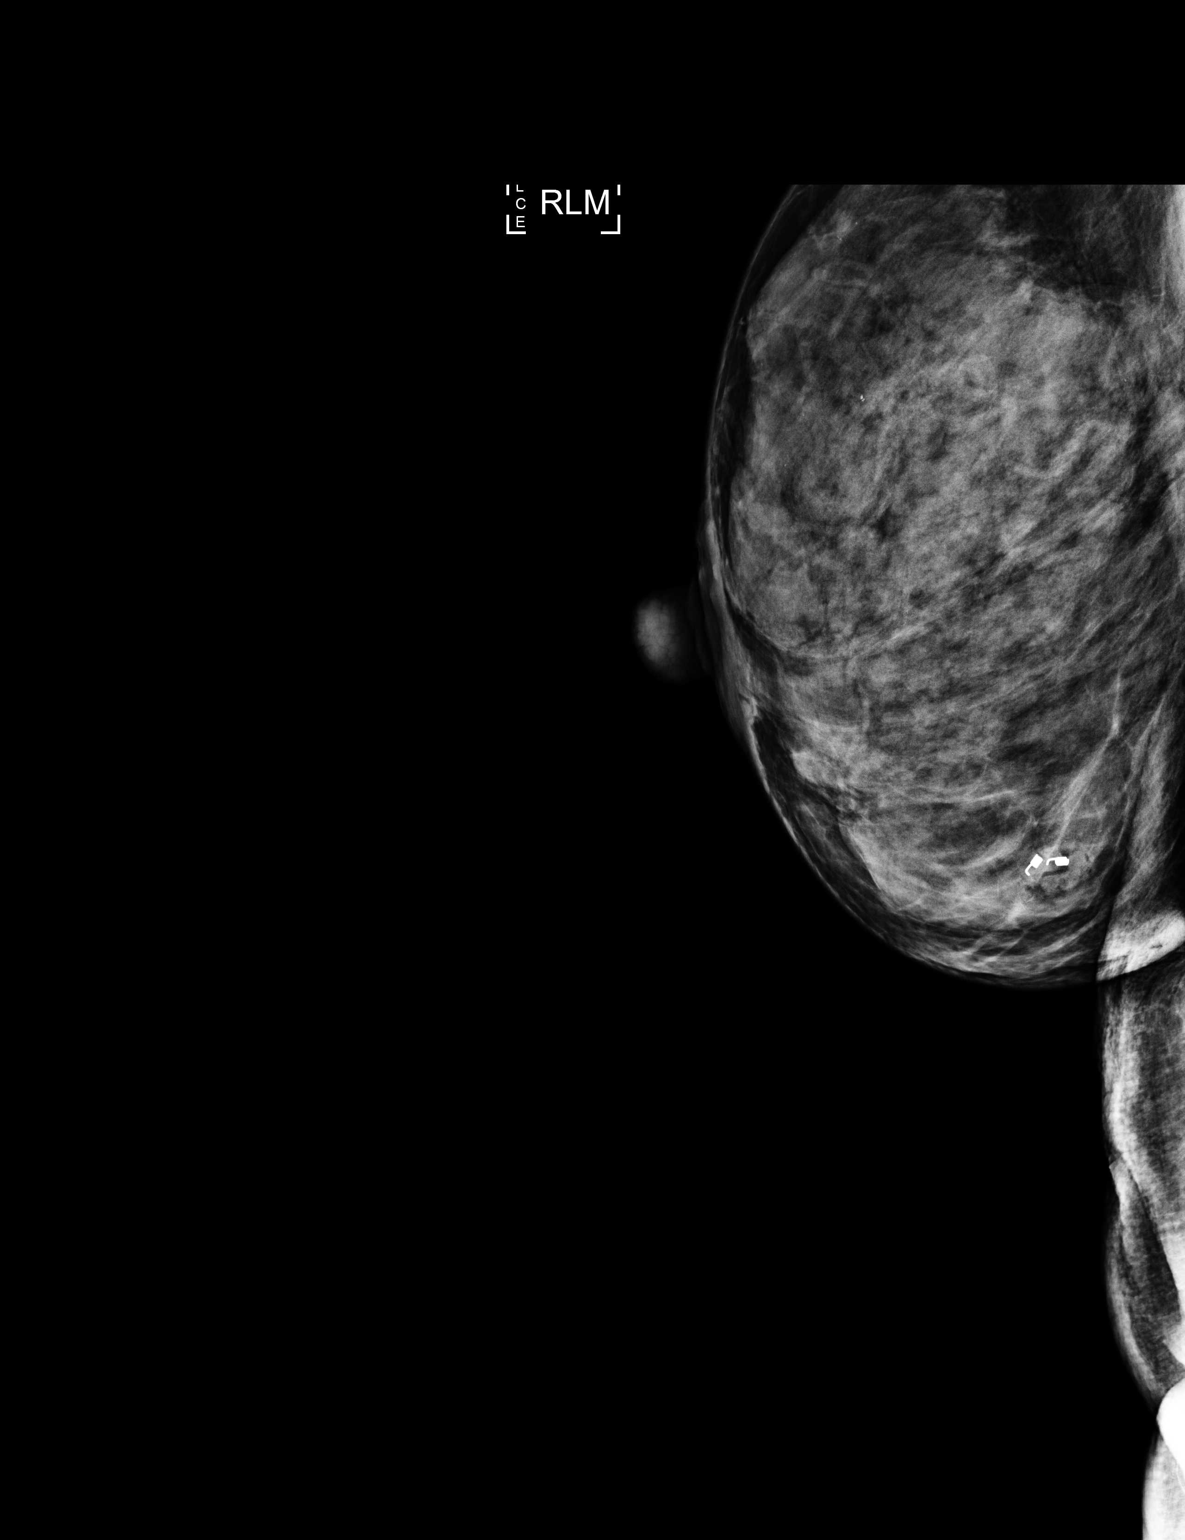

[R CC]
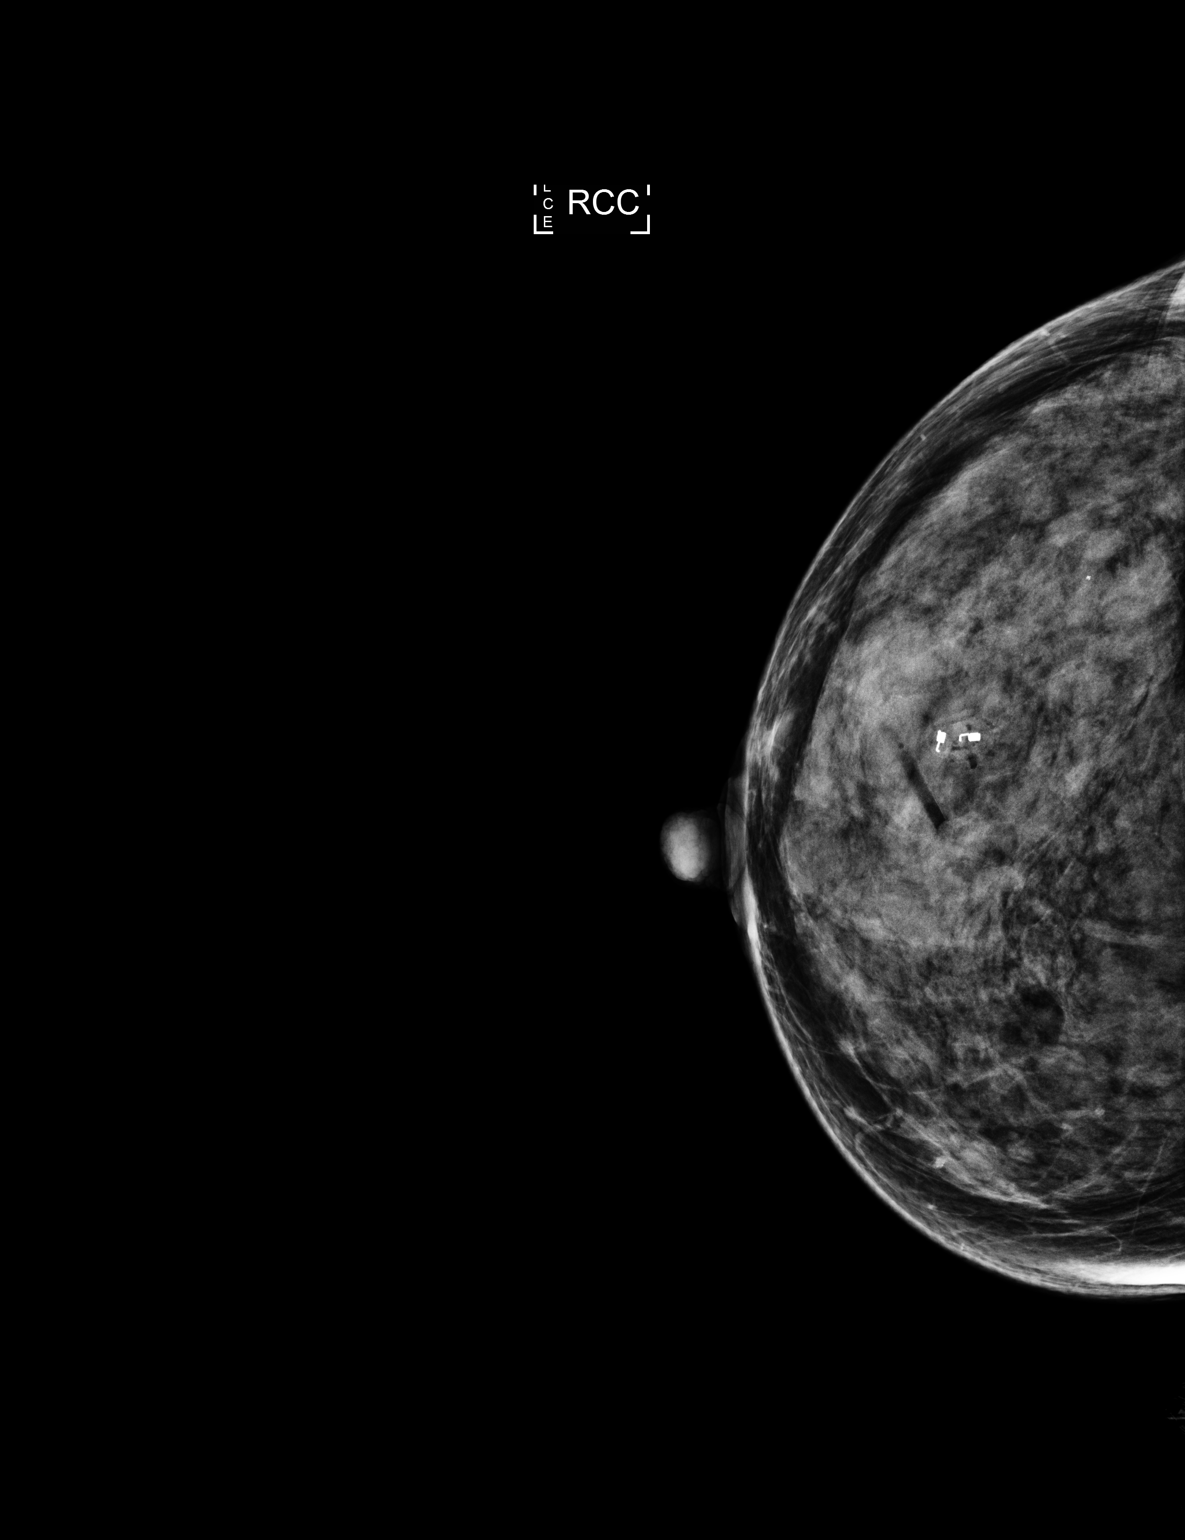

[2 of 2 positions shown; findings below may reference images not displayed]

FINDINGS: Mammographic images were obtained following stereotactic guided
biopsy of right breast calcifications. Coil shaped marking clip in
appropriate position at the site of biopsied calcifications.

On the prior diagnostic evaluation [DATE] there is suggestion a
group of calcifications located within the posteromedial right
breast which may represent a separate group of calcifications. The
initially questioned calcifications within the lower outer right
breast were biopsied on current exam.
IMPRESSION: Coil shaped marking clip at appropriate position for biopsy
calcifications lower outer right breast.

There may be an additional group of calcifications within the far
medial posterior right breast, demonstrated on the magnification CC
view on diagnostic exam [DATE]. Recommend six-month follow-up
right breast diagnostic mammogram with magnification views for
follow-up of these calcifications within the medial posterior right
breast.

Final Assessment: Post Procedure Mammograms for Marker Placement

## 2017-08-07 IMAGING — MG STEREOTACTIC VACUUM ASSIST RIGHT
8 of 9 series · 8 of 17 positions shown · non-contrast
Comparison: Previous exams.

ADDENDUM:
Pathology revealed COLUMNAR CELL AND FIBROCYSTIC CHANGES INCLUDING
APOCRINE METAPLASIA WITH CALCIFICATIONS, PSEUDOANGIOMATOUS STROMAL
HYPERPLASIA of RIGHT breast, lower outer. This was found to be
concordant by Dr. AMNON.

Pathology results were discussed with the patient by telephone. The
patient reported doing well after the biopsy with tenderness at the
site. Post biopsy instructions and care were reviewed and questions
were answered. The patient was encouraged to call The [REDACTED]
The patient was instructed to return for right diagnostic
mammography in 6 months and informed a reminder notice would be sent
regarding this appointment.
Pathology results reported by AMNON, RN on [DATE].
CLINICAL DATA: Patient with indeterminate right breast
calcifications.
EXAM:
RIGHT BREAST STEREOTACTIC CORE NEEDLE BIOPSY

[R (1 of 6)]
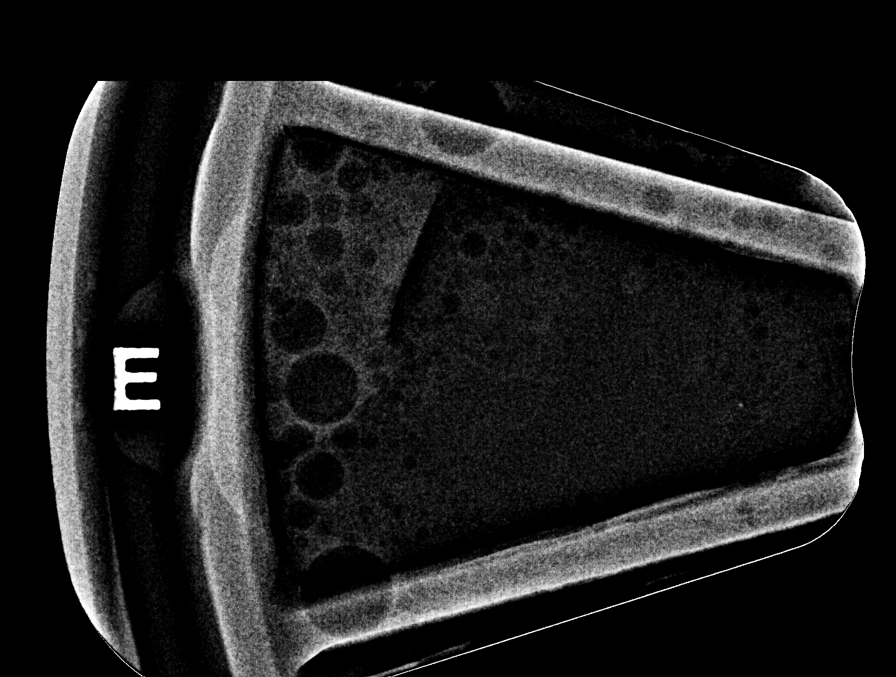

[R (2 of 6)]
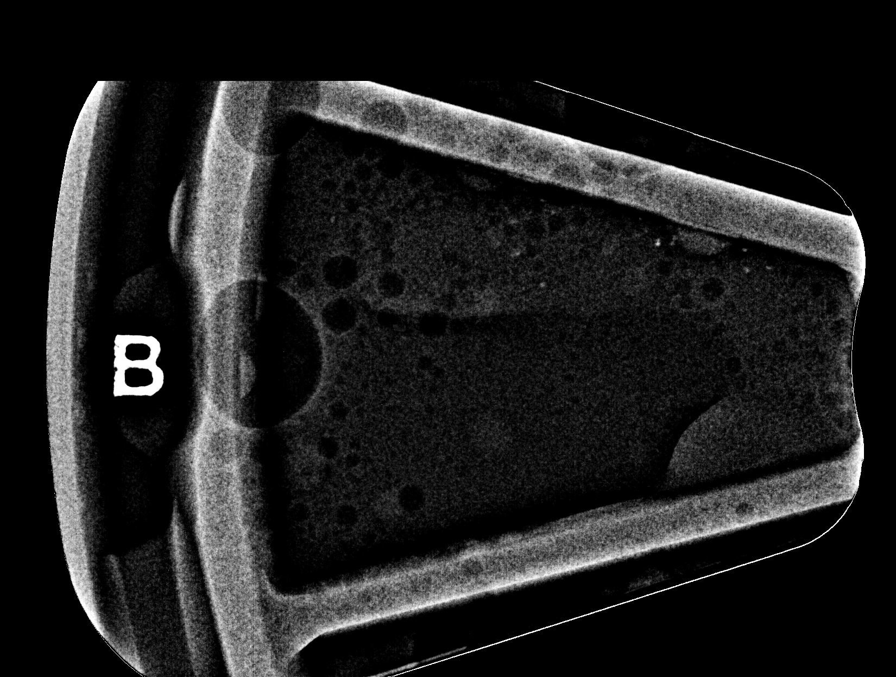

[R (3 of 6)]
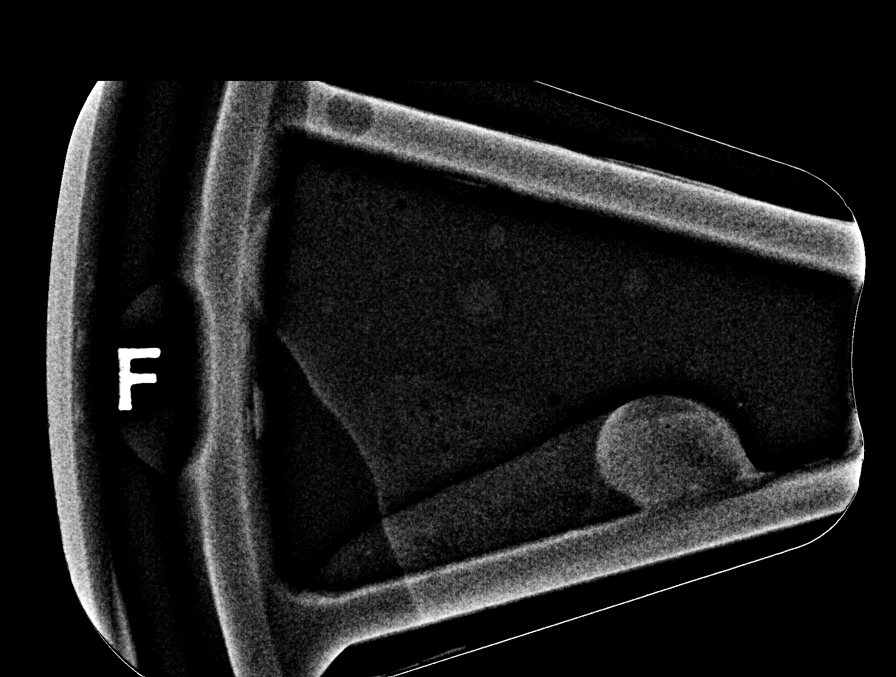

[R (4 of 6)]
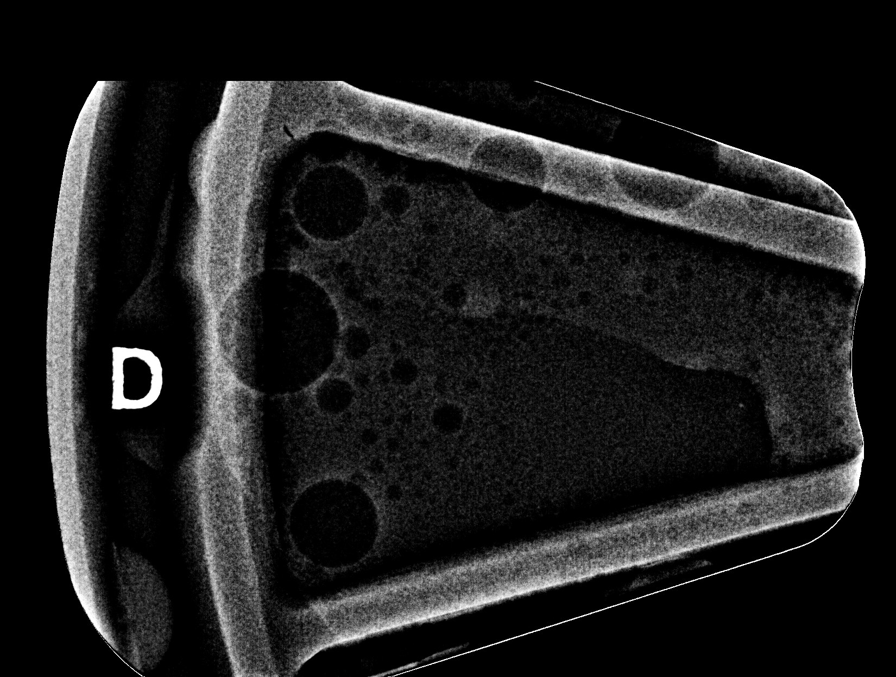

[R (5 of 6)]
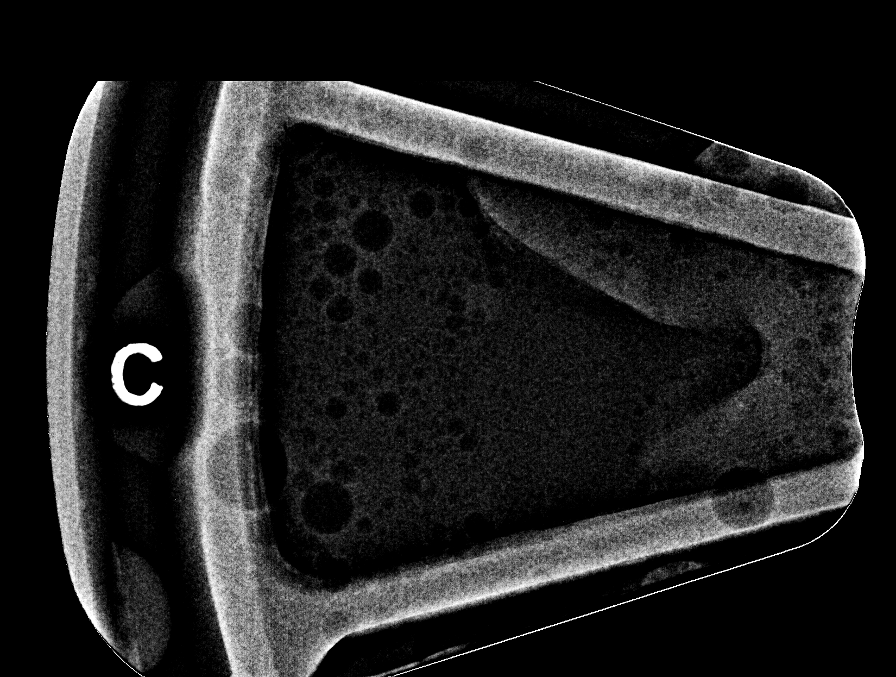

[R (6 of 6)]
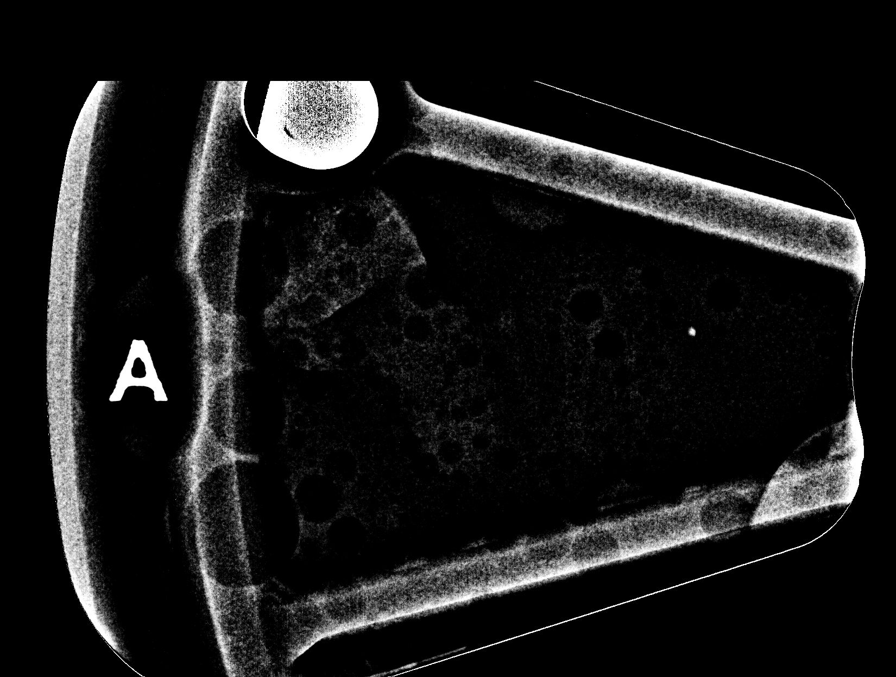

[R LM]
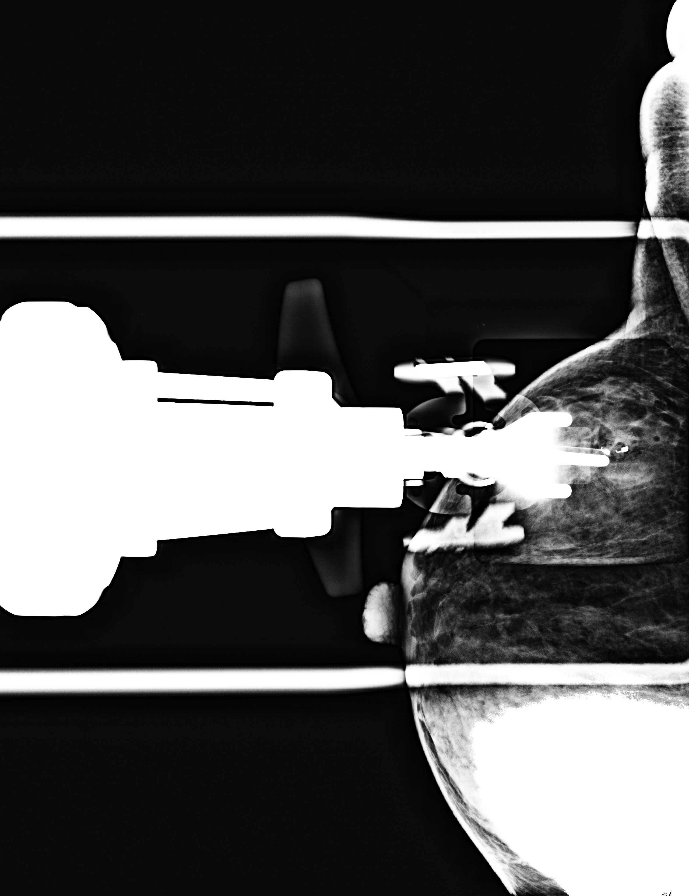

[R LM tomo · tomo slice 16/31.0]
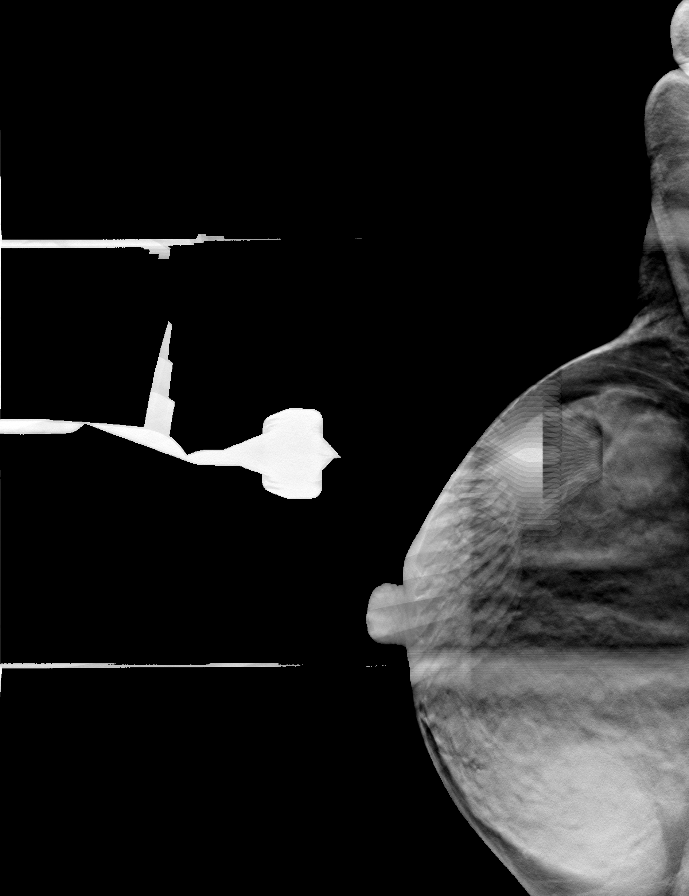

[8 of 17 positions shown; findings below may reference images not displayed]



Using sterile technique and 1% Lidocaine as local anesthetic, under
stereotactic guidance, a 9 gauge vacuum assisted device was used to
perform core needle biopsy of calcifications within the lower outer
right breast middle depth using a lateral approach. Specimen
radiograph was performed showing calcifications. Specimens with
calcifications are identified for pathology.

Lesion quadrant: Lower outer quadrant

At the conclusion of the procedure, a coil shaped tissue marker clip
was deployed into the biopsy cavity. Follow-up 2-view mammogram was
performed and dictated separately.
IMPRESSION: Stereotactic-guided biopsy of right breast calcifications. No
apparent complications.

## 2017-08-10 ENCOUNTER — Other Ambulatory Visit: Payer: BLUE CROSS/BLUE SHIELD

## 2017-09-22 DIAGNOSIS — N951 Menopausal and female climacteric states: Secondary | ICD-10-CM | POA: Diagnosis not present

## 2017-11-13 DIAGNOSIS — Z681 Body mass index (BMI) 19 or less, adult: Secondary | ICD-10-CM | POA: Diagnosis not present

## 2017-11-13 DIAGNOSIS — Z13820 Encounter for screening for osteoporosis: Secondary | ICD-10-CM | POA: Diagnosis not present

## 2017-11-13 DIAGNOSIS — Z01419 Encounter for gynecological examination (general) (routine) without abnormal findings: Secondary | ICD-10-CM | POA: Diagnosis not present

## 2017-11-19 DIAGNOSIS — N959 Unspecified menopausal and perimenopausal disorder: Secondary | ICD-10-CM | POA: Diagnosis not present

## 2017-11-19 DIAGNOSIS — E039 Hypothyroidism, unspecified: Secondary | ICD-10-CM | POA: Diagnosis not present

## 2017-11-19 DIAGNOSIS — R5381 Other malaise: Secondary | ICD-10-CM | POA: Diagnosis not present

## 2017-12-03 DIAGNOSIS — N951 Menopausal and female climacteric states: Secondary | ICD-10-CM | POA: Diagnosis not present

## 2017-12-03 DIAGNOSIS — Z0001 Encounter for general adult medical examination with abnormal findings: Secondary | ICD-10-CM | POA: Diagnosis not present

## 2017-12-05 DIAGNOSIS — H2513 Age-related nuclear cataract, bilateral: Secondary | ICD-10-CM | POA: Diagnosis not present

## 2017-12-05 DIAGNOSIS — H5203 Hypermetropia, bilateral: Secondary | ICD-10-CM | POA: Diagnosis not present

## 2017-12-05 DIAGNOSIS — H524 Presbyopia: Secondary | ICD-10-CM | POA: Diagnosis not present

## 2017-12-05 DIAGNOSIS — H52203 Unspecified astigmatism, bilateral: Secondary | ICD-10-CM | POA: Diagnosis not present

## 2018-01-06 DIAGNOSIS — N951 Menopausal and female climacteric states: Secondary | ICD-10-CM | POA: Diagnosis not present

## 2018-01-12 ENCOUNTER — Other Ambulatory Visit: Payer: Self-pay | Admitting: Obstetrics and Gynecology

## 2018-01-12 DIAGNOSIS — R921 Mammographic calcification found on diagnostic imaging of breast: Secondary | ICD-10-CM

## 2018-02-02 DIAGNOSIS — N951 Menopausal and female climacteric states: Secondary | ICD-10-CM | POA: Diagnosis not present

## 2018-02-02 DIAGNOSIS — R5383 Other fatigue: Secondary | ICD-10-CM | POA: Diagnosis not present

## 2018-02-17 ENCOUNTER — Encounter (INDEPENDENT_AMBULATORY_CARE_PROVIDER_SITE_OTHER): Payer: Self-pay | Admitting: Family Medicine

## 2018-02-17 ENCOUNTER — Ambulatory Visit (INDEPENDENT_AMBULATORY_CARE_PROVIDER_SITE_OTHER): Payer: BLUE CROSS/BLUE SHIELD | Admitting: Family Medicine

## 2018-02-17 ENCOUNTER — Ambulatory Visit (INDEPENDENT_AMBULATORY_CARE_PROVIDER_SITE_OTHER): Payer: Self-pay

## 2018-02-17 DIAGNOSIS — M25531 Pain in right wrist: Secondary | ICD-10-CM

## 2018-02-17 DIAGNOSIS — S63091A Other subluxation of right wrist and hand, initial encounter: Secondary | ICD-10-CM | POA: Diagnosis not present

## 2018-02-17 NOTE — Progress Notes (Signed)
Office Visit Note   Patient: Kaitlyn Lopez           Date of Birth: Dec 17, 1960           MRN: [id number] Visit Date: 02/17/2018 Requested by: Donald Prose, MD [address] [place], [place] [address] PCP: Donald Prose, MD  Subjective: Chief Complaint  Patient presents with  . Right Wrist - Pain    Pain since July 2019. 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 Referred by Edwena Felty at [facility]. No relief with dry needling. Right -hand dominant.    HPI: She is a 57 year old right-hand-dominant female seen at the request of Kym Groom for right wrist pain.  Symptoms started a few months ago.  No definite injury, but she does a lot of oil painting and she started having progressive pain on the ulnar side of her wrist.  She rested from painting but her pain persisted.  Now she has pain when doing yoga activities and Pilates as well.  No numbness or tingling in her hand, no previous trauma to her wrist that she can remember.  No personal or family history of rheumatologic disease to her knowledge.  She is otherwise in very good health.  She will leave in a month to [place] for the winter.  Her husband is a Haematologist.              ROS: Otherwise noncontributory  Objective: Vital Signs: There were no vitals taken for this visit.  Physical Exam:  Right wrist: No swelling or deformity, full flexion extension, pronation and supination of the forearm.  Mild pain with wrist extension against resistance and with fourth and fifth finger extension against resistance.  Tender near the ulnar styloid, particularly over the ECU tendon.  With supination of her forearm, it feels as though the tendon is subluxing.  Imaging: X-rays right wrist: There is a notch in the ulnar styloid.  Otherwise wrist joint looks good, minimal arthritic change and no sign of stress fracture.  Musculoskeletal ultrasound: Her ECU tendon is intact, but it subluxes with supination when  viewed on dynamic imaging.  Other extensor tendons of her fingers have normal appearance.  Assessment & Plan: 1.  Chronic right wrist pain with exam suggesting subluxation of the ECU tendon -We will try splinting per occupational therapy. -If symptoms persist then hand surgery consult.  Could contemplate a one-time cortisone injection, but she was not interested in that at this point.   Follow-Up Instructions: No follow-ups on file.      Procedures: No procedures performed  No notes on file    PMFS History: Patient Active Problem List   Diagnosis Date Noted  . Lumbar degenerative disc disease 11/22/2014  . Lumbar radicular pain 11/22/2014  . Chronic pain syndrome 08/03/2014   Past Medical History:  Diagnosis Date  . Anemia   . Anxiety   . Contact lens/glasses fitting    wears contacts or glasses  . Depression   . Hypothyroidism   . Thyroid disease     Family History  Problem Relation Age of Onset  . Cancer Maternal Grandfather        breast  . Breast cancer Paternal Grandmother     Past Surgical History:  Procedure Laterality Date  . BREAST BIOPSY Right 01/08/2013   Procedure: RIGHT BREAST WITH NEEDLE LOCALIZATION;  Surgeon: Marcello Moores A. [place], MD;  Location: [facility];  Service: General;  Laterality: Right;  . BREAST EXCISIONAL  BIOPSY    . BREAST SURGERY  1992   lumpectomy-lt neg  . TRANSFORAMINAL LUMBAR INTERBODY FUSION W/ MIS 4 LEVEL  2011   Social History   Occupational History  . Not on file  Tobacco Use  . Smoking status: Never Smoker  . Smokeless tobacco: Never Used  Substance and Sexual Activity  . Alcohol use: Yes  . Drug use: No  . Sexual activity: Not on file

## 2018-02-18 ENCOUNTER — Ambulatory Visit
Admission: RE | Admit: 2018-02-18 | Discharge: 2018-02-18 | Disposition: A | Discharge status: Home / Self Care | Payer: BLUE CROSS/BLUE SHIELD | Source: Ambulatory Visit | Attending: Obstetrics and Gynecology | Admitting: Obstetrics and Gynecology

## 2018-02-18 DIAGNOSIS — R921 Mammographic calcification found on diagnostic imaging of breast: Secondary | ICD-10-CM

## 2018-02-18 DIAGNOSIS — R922 Inconclusive mammogram: Secondary | ICD-10-CM | POA: Diagnosis not present

## 2018-02-18 IMAGING — MG DIGITAL DIAGNOSTIC BILATERAL MAMMOGRAM WITH TOMO AND CAD
9 of 13 series · 9 of 29 positions shown · non-contrast
Comparison: Previous exam(s).

CLINICAL DATA: Patient had a stereotactic biopsy of the right
breast which was benign. Short-term interval follow-up of possible
calcifications in the medial aspect of the breast.

EXAM:
DIGITAL DIAGNOSTIC BILATERAL MAMMOGRAM WITH CAD AND TOMO

[R CC (1 of 2)]
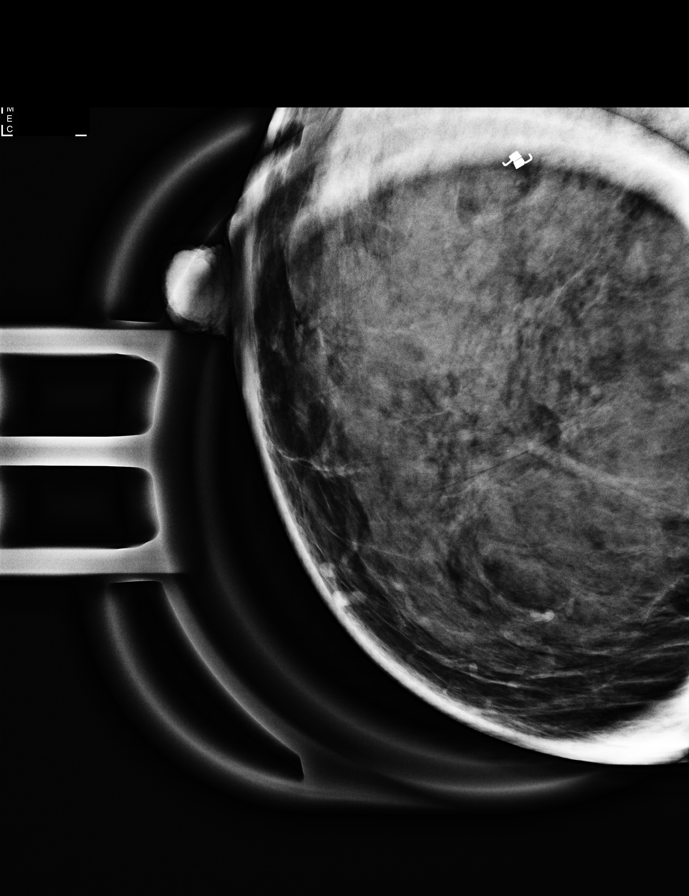

[R XCCL (1 of 2)]
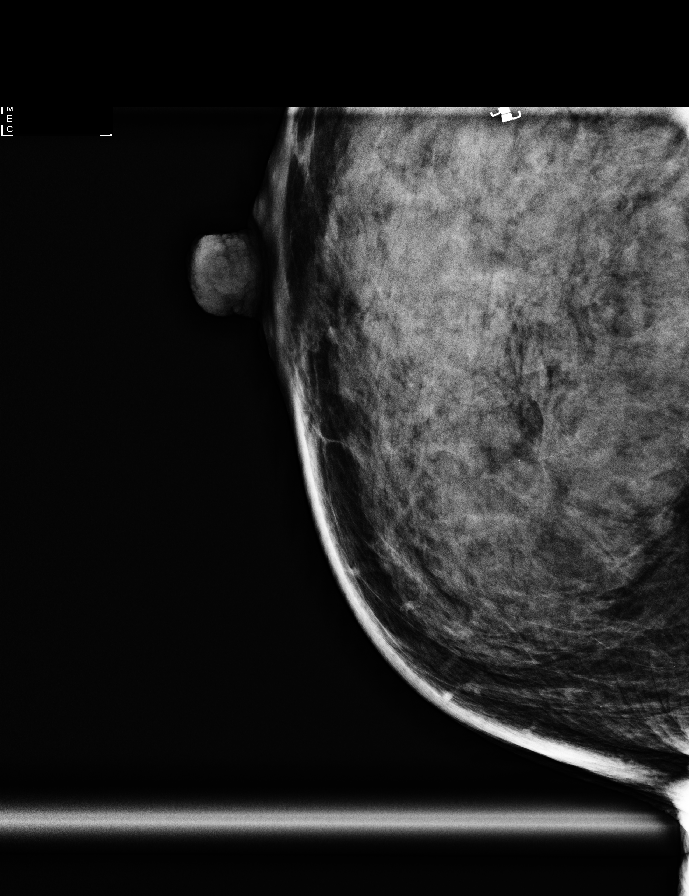

[R XCCL (2 of 2)]
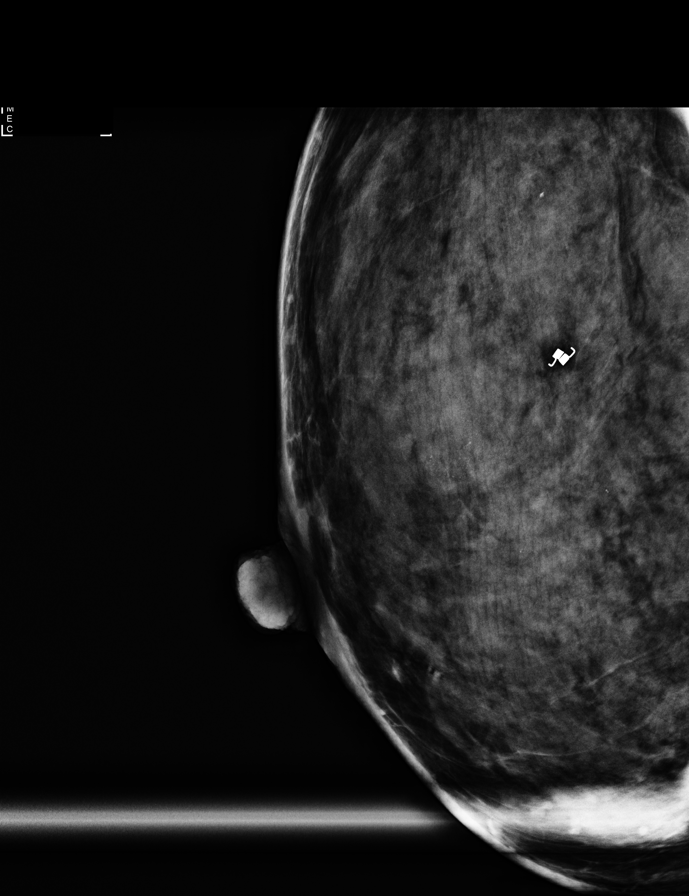

[R ML]
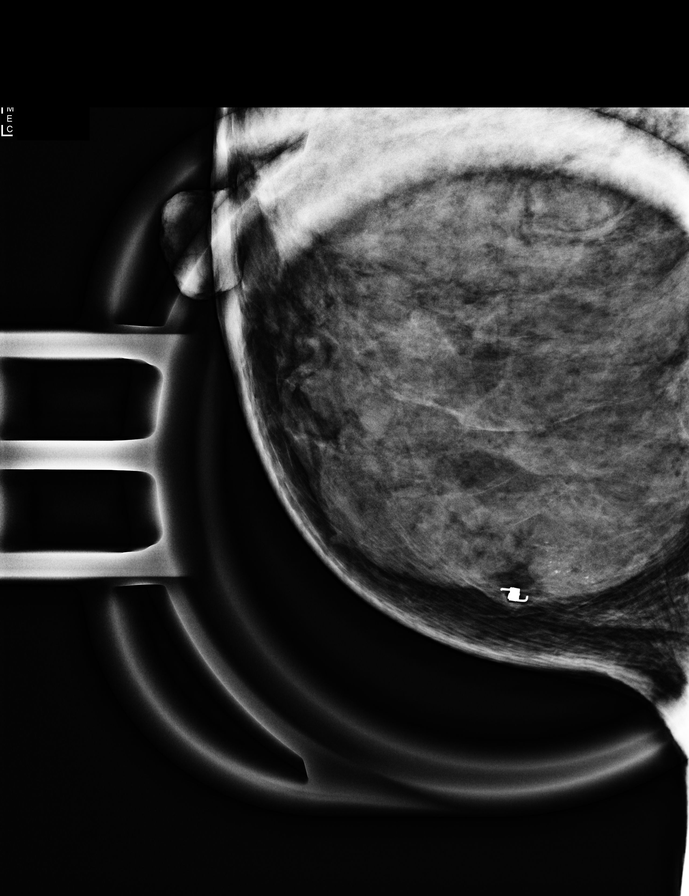

[R CC (2 of 2)]
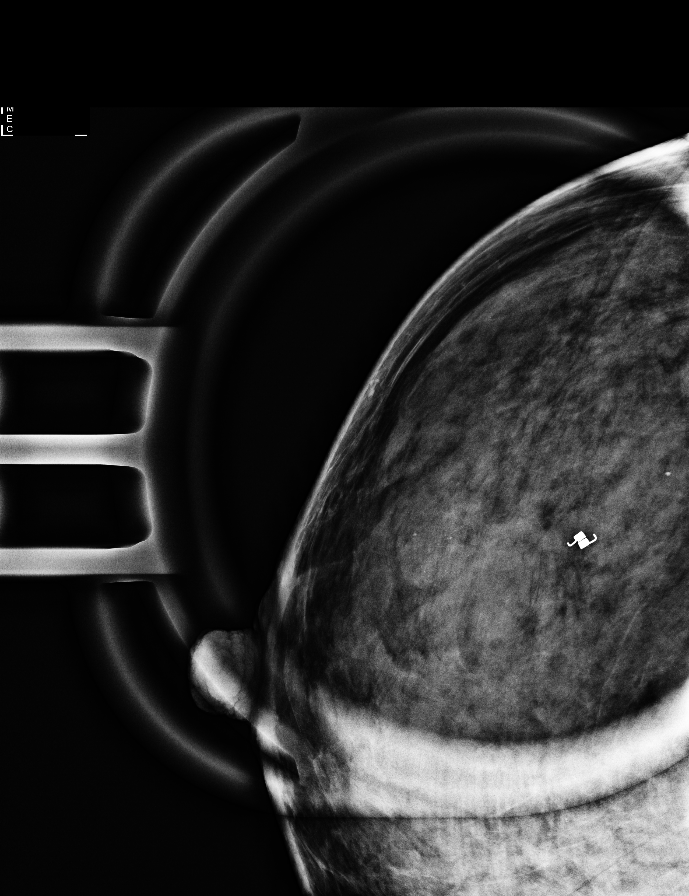

[L CC synth-2D]
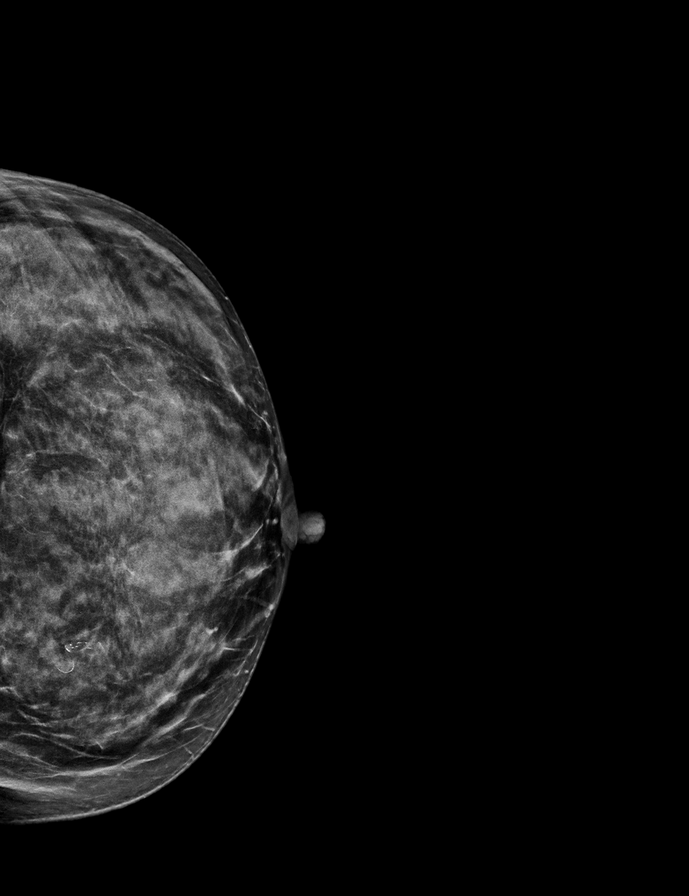

[L MLO synth-2D]
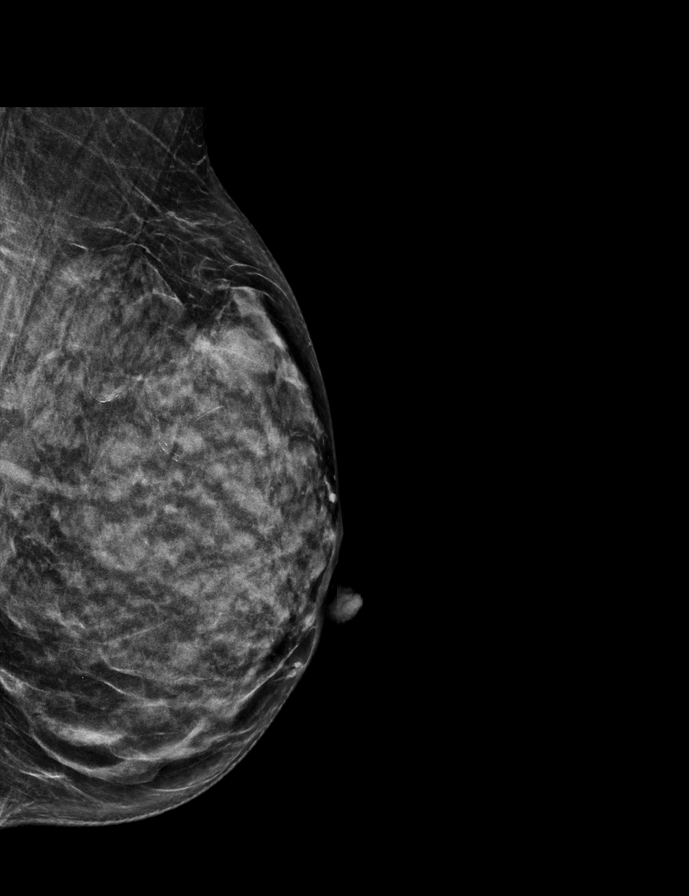

[R CC synth-2D]
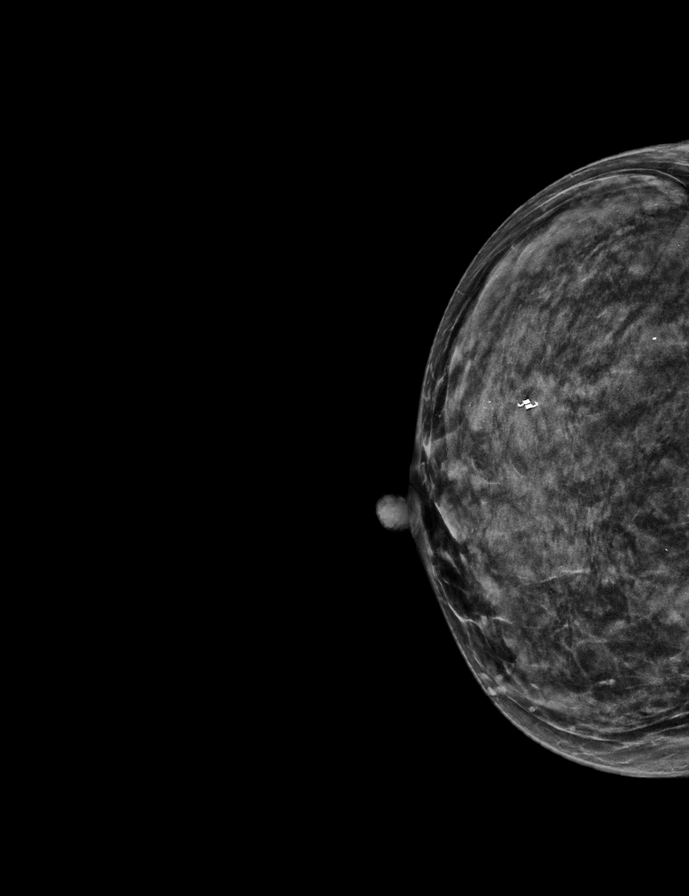

[R MLO synth-2D]
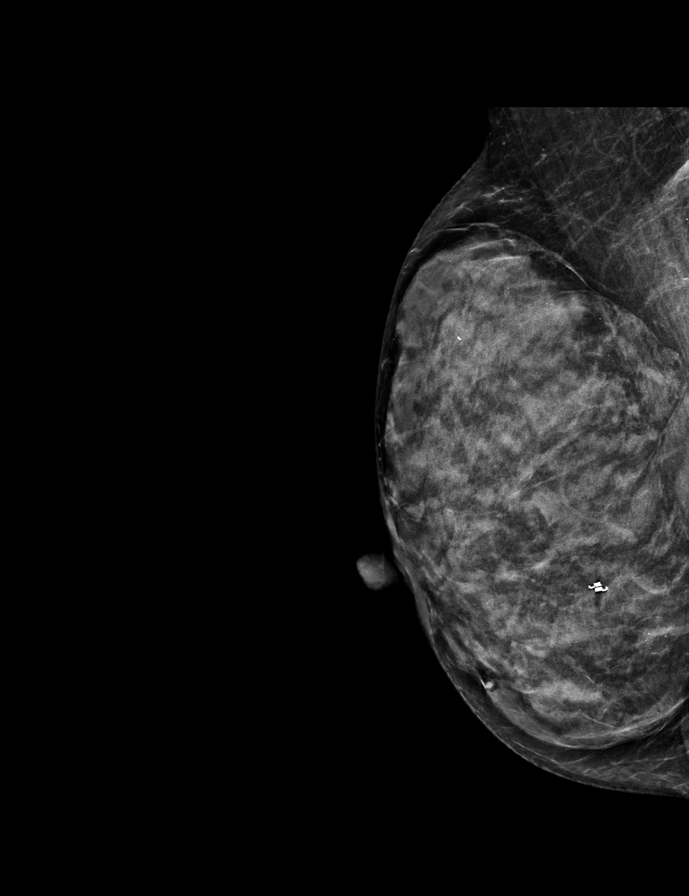

[9 of 29 positions shown; findings below may reference images not displayed]

ACR Breast Density Category c: The breast tissue is heterogeneously
dense, which may obscure small masses.
FINDINGS: No suspicious mass, malignant type microcalcifications or distortion
detected in either breast.

Mammographic images were processed with CAD.
IMPRESSION: No evidence of malignancy in either breast.

RECOMMENDATION:
Bilateral screening mammogram in 1 year is recommended.

I have discussed the findings and recommendations with the patient.
Results were also provided in writing at the conclusion of the
visit. If applicable, a reminder letter will be sent to the patient
regarding the next appointment.

BI-RADS CATEGORY  2: Benign.

## 2018-02-23 ENCOUNTER — Telehealth (INDEPENDENT_AMBULATORY_CARE_PROVIDER_SITE_OTHER): Payer: Self-pay | Admitting: Family Medicine

## 2018-02-23 NOTE — Telephone Encounter (Signed)
Please advise 

## 2018-02-23 NOTE — Telephone Encounter (Signed)
My Chart message sent

## 2018-02-23 NOTE — Telephone Encounter (Signed)
Patient called this morning stating that the facility that Dr. Junius Roads referred her to advised her that the order states that Dr. Junius Roads wants her to see a hand surgeon and not that she needs to be fitted for a brace.  She is confused as to what Dr. Junius Roads wants her to do.  She stated that if he wants her to see a hand surgeon, then she will need a referral for a doctor in [place], if she is needing to be fitted for the brace, could you fax that order to them at [phone].  She would also like a call back at [phone].  She will be at her dentist appointment for about an hour.  Thank you.

## 2018-02-23 NOTE — Telephone Encounter (Signed)
Read out instructions/info from the MyChart message to the patient.  The OT place we referred her to does not just make custom splints without therapy.  Sending her to [facility] on [address] instead.  The referral and demographics will be sent by our referral coordinator Gabriel Cirri), and then [facility] will contact the patient to have her come in this week for a custom splint.

## 2018-02-25 ENCOUNTER — Telehealth (INDEPENDENT_AMBULATORY_CARE_PROVIDER_SITE_OTHER): Payer: Self-pay | Admitting: Family Medicine

## 2018-02-25 NOTE — Telephone Encounter (Signed)
Called and left message that the patient does not need to be seen there.  She just needed to have a splint made (going to [facility] on [address] for this).

## 2018-02-25 NOTE — Telephone Encounter (Signed)
Kaitlyn Lopez from [facility] called stating that there was some confusion with the referral and it went to the doctor's side and not the therapy side.  They would be glad to still she her if she needs to still be seen.  Kaitlyn Lopez's CB#[phone].  Thank you.

## 2018-02-26 DIAGNOSIS — M25531 Pain in right wrist: Secondary | ICD-10-CM | POA: Diagnosis not present

## 2018-02-26 DIAGNOSIS — S63509S Unspecified sprain of unspecified wrist, sequela: Secondary | ICD-10-CM | POA: Diagnosis not present

## 2018-02-26 DIAGNOSIS — S63091S Other subluxation of right wrist and hand, sequela: Secondary | ICD-10-CM | POA: Diagnosis not present

## 2018-02-26 DIAGNOSIS — M70941 Unspecified soft tissue disorder related to use, overuse and pressure, right hand: Secondary | ICD-10-CM | POA: Diagnosis not present

## 2018-03-10 DIAGNOSIS — Z23 Encounter for immunization: Secondary | ICD-10-CM | POA: Diagnosis not present

## 2018-03-10 DIAGNOSIS — L821 Other seborrheic keratosis: Secondary | ICD-10-CM | POA: Diagnosis not present

## 2018-03-10 DIAGNOSIS — D0359 Melanoma in situ of other part of trunk: Secondary | ICD-10-CM | POA: Diagnosis not present

## 2018-03-10 DIAGNOSIS — C44719 Basal cell carcinoma of skin of left lower limb, including hip: Secondary | ICD-10-CM | POA: Diagnosis not present

## 2018-03-10 DIAGNOSIS — D2271 Melanocytic nevi of right lower limb, including hip: Secondary | ICD-10-CM | POA: Diagnosis not present

## 2018-03-10 DIAGNOSIS — D485 Neoplasm of uncertain behavior of skin: Secondary | ICD-10-CM | POA: Diagnosis not present

## 2018-03-10 DIAGNOSIS — D225 Melanocytic nevi of trunk: Secondary | ICD-10-CM | POA: Diagnosis not present

## 2018-03-10 DIAGNOSIS — L570 Actinic keratosis: Secondary | ICD-10-CM | POA: Diagnosis not present

## 2018-03-12 ENCOUNTER — Telehealth (INDEPENDENT_AMBULATORY_CARE_PROVIDER_SITE_OTHER): Payer: Self-pay | Admitting: Family Medicine

## 2018-03-12 NOTE — Telephone Encounter (Signed)
Patient called requesting copy of X-ray and  Ultrtasound. Patient leaving going to [place] and need by Monday, 03/16/18.  Please call patient to advise if any charge and when she can pick up.

## 2018-03-13 NOTE — Telephone Encounter (Signed)
Patient aware CD is made and report from ultrasound is printed, unable to make copy of ultrasound imaging. She will pick up at front desk on Monday.

## 2018-03-16 DIAGNOSIS — D0359 Melanoma in situ of other part of trunk: Secondary | ICD-10-CM | POA: Diagnosis not present

## 2018-03-16 DIAGNOSIS — R6889 Other general symptoms and signs: Secondary | ICD-10-CM | POA: Diagnosis not present

## 2018-03-16 DIAGNOSIS — R05 Cough: Secondary | ICD-10-CM | POA: Diagnosis not present

## 2018-03-24 DIAGNOSIS — D0359 Melanoma in situ of other part of trunk: Secondary | ICD-10-CM | POA: Diagnosis not present

## 2018-03-26 DIAGNOSIS — M25552 Pain in left hip: Secondary | ICD-10-CM | POA: Diagnosis not present

## 2018-03-26 DIAGNOSIS — M545 Low back pain: Secondary | ICD-10-CM | POA: Diagnosis not present

## 2018-03-31 DIAGNOSIS — N951 Menopausal and female climacteric states: Secondary | ICD-10-CM | POA: Diagnosis not present

## 2018-04-02 DIAGNOSIS — S63501A Unspecified sprain of right wrist, initial encounter: Secondary | ICD-10-CM | POA: Diagnosis not present

## 2018-04-02 DIAGNOSIS — G5702 Lesion of sciatic nerve, left lower limb: Secondary | ICD-10-CM | POA: Diagnosis not present

## 2018-04-08 DIAGNOSIS — G5702 Lesion of sciatic nerve, left lower limb: Secondary | ICD-10-CM | POA: Diagnosis not present

## 2018-04-14 DIAGNOSIS — N951 Menopausal and female climacteric states: Secondary | ICD-10-CM | POA: Diagnosis not present

## 2018-04-15 DIAGNOSIS — G5702 Lesion of sciatic nerve, left lower limb: Secondary | ICD-10-CM | POA: Diagnosis not present

## 2018-04-23 DIAGNOSIS — G5702 Lesion of sciatic nerve, left lower limb: Secondary | ICD-10-CM | POA: Diagnosis not present

## 2018-04-28 DIAGNOSIS — G5702 Lesion of sciatic nerve, left lower limb: Secondary | ICD-10-CM | POA: Diagnosis not present

## 2018-05-06 DIAGNOSIS — G5702 Lesion of sciatic nerve, left lower limb: Secondary | ICD-10-CM | POA: Diagnosis not present

## 2018-05-14 DIAGNOSIS — G5702 Lesion of sciatic nerve, left lower limb: Secondary | ICD-10-CM | POA: Diagnosis not present

## 2018-05-26 DIAGNOSIS — R109 Unspecified abdominal pain: Secondary | ICD-10-CM | POA: Diagnosis not present

## 2018-05-26 DIAGNOSIS — N939 Abnormal uterine and vaginal bleeding, unspecified: Secondary | ICD-10-CM | POA: Diagnosis not present

## 2018-05-28 DIAGNOSIS — D259 Leiomyoma of uterus, unspecified: Secondary | ICD-10-CM | POA: Diagnosis not present

## 2018-09-11 DIAGNOSIS — E559 Vitamin D deficiency, unspecified: Secondary | ICD-10-CM | POA: Diagnosis not present

## 2018-09-11 DIAGNOSIS — E039 Hypothyroidism, unspecified: Secondary | ICD-10-CM | POA: Diagnosis not present

## 2018-09-11 DIAGNOSIS — E539 Vitamin B deficiency, unspecified: Secondary | ICD-10-CM | POA: Diagnosis not present

## 2018-09-11 DIAGNOSIS — N951 Menopausal and female climacteric states: Secondary | ICD-10-CM | POA: Diagnosis not present

## 2018-09-11 DIAGNOSIS — R5383 Other fatigue: Secondary | ICD-10-CM | POA: Diagnosis not present

## 2018-11-05 DIAGNOSIS — E039 Hypothyroidism, unspecified: Secondary | ICD-10-CM | POA: Diagnosis not present

## 2018-11-05 DIAGNOSIS — Z0001 Encounter for general adult medical examination with abnormal findings: Secondary | ICD-10-CM | POA: Diagnosis not present

## 2018-11-05 DIAGNOSIS — N951 Menopausal and female climacteric states: Secondary | ICD-10-CM | POA: Diagnosis not present

## 2018-11-24 ENCOUNTER — Other Ambulatory Visit: Payer: Self-pay | Admitting: Obstetrics and Gynecology

## 2018-11-24 DIAGNOSIS — Z1231 Encounter for screening mammogram for malignant neoplasm of breast: Secondary | ICD-10-CM

## 2019-02-22 ENCOUNTER — Other Ambulatory Visit: Payer: Self-pay

## 2019-02-22 ENCOUNTER — Ambulatory Visit
Admission: RE | Admit: 2019-02-22 | Discharge: 2019-02-22 | Disposition: A | Discharge status: Home / Self Care | Payer: BLUE CROSS/BLUE SHIELD | Source: Ambulatory Visit | Attending: Obstetrics and Gynecology | Admitting: Obstetrics and Gynecology

## 2019-02-22 DIAGNOSIS — Z1231 Encounter for screening mammogram for malignant neoplasm of breast: Secondary | ICD-10-CM

## 2019-02-22 DIAGNOSIS — Z20822 Contact with and (suspected) exposure to covid-19: Secondary | ICD-10-CM

## 2019-02-22 IMAGING — MG DIGITAL SCREENING BILAT W/ TOMO W/ CAD
6 of 10 series · 6 of 30 positions shown · non-contrast
Comparison: Previous exam(s).

CLINICAL DATA: Screening.

EXAM:
DIGITAL SCREENING BILATERAL MAMMOGRAM WITH TOMO AND CAD

[R MLO synth-2D]
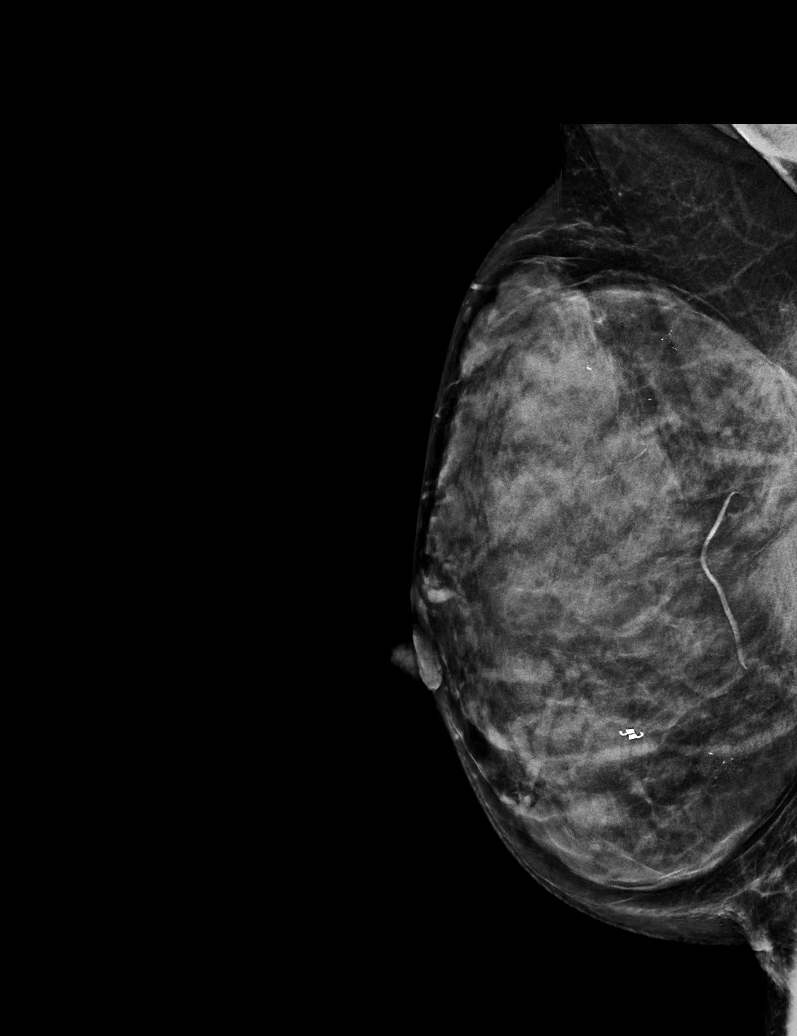

[L MLO synth-2D]
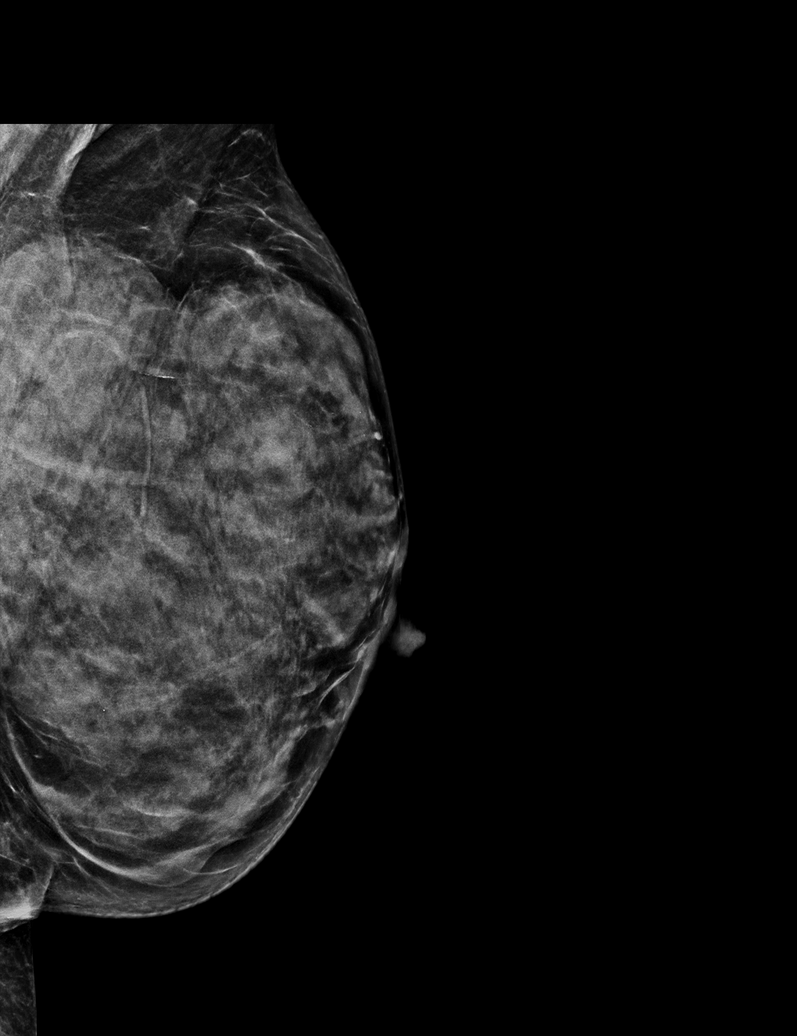

[L CC synth-2D]
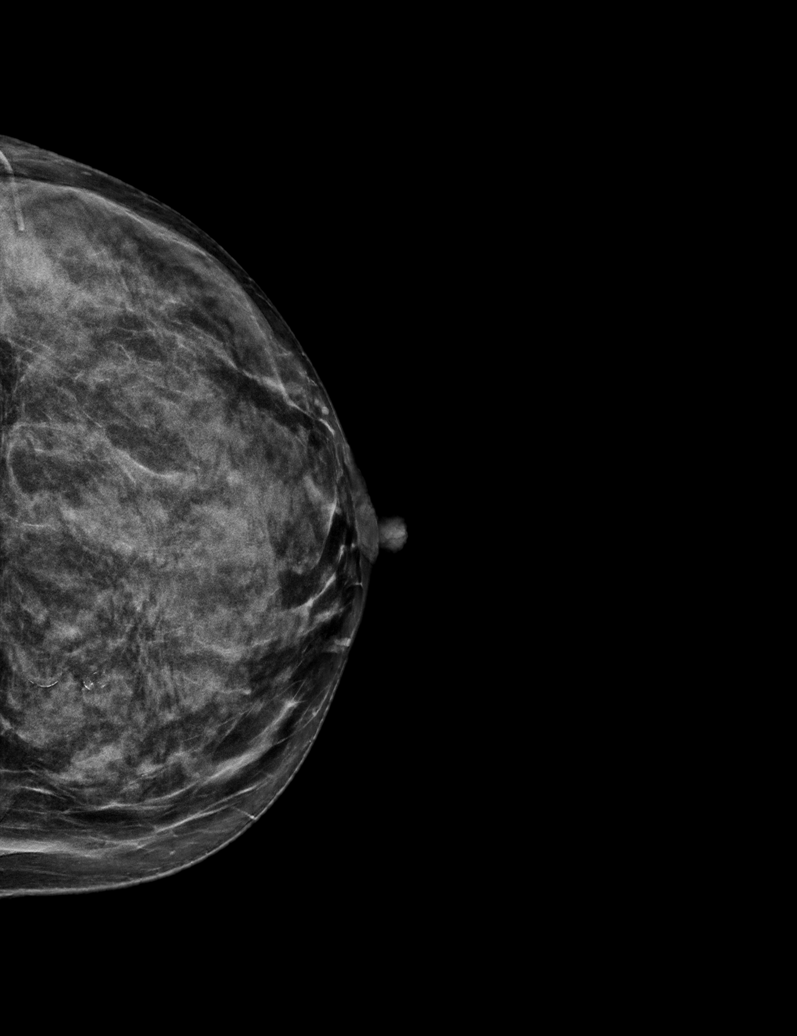

[R CC synth-2D (1 of 2)]
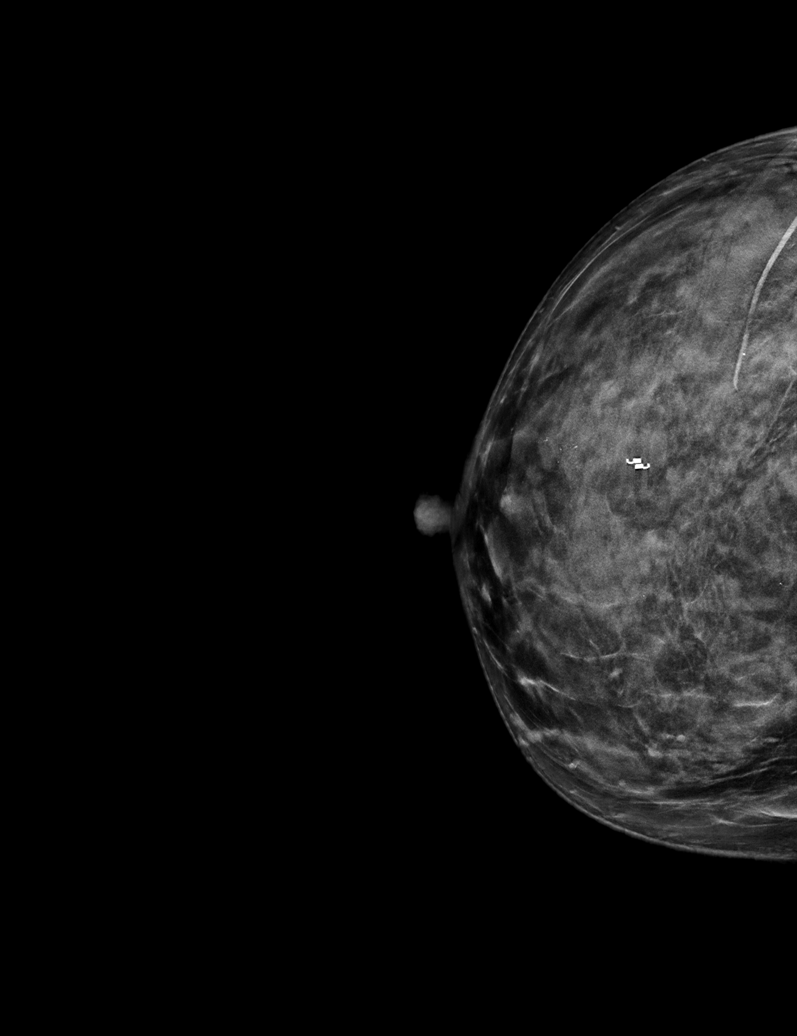

[R CC synth-2D (2 of 2)]
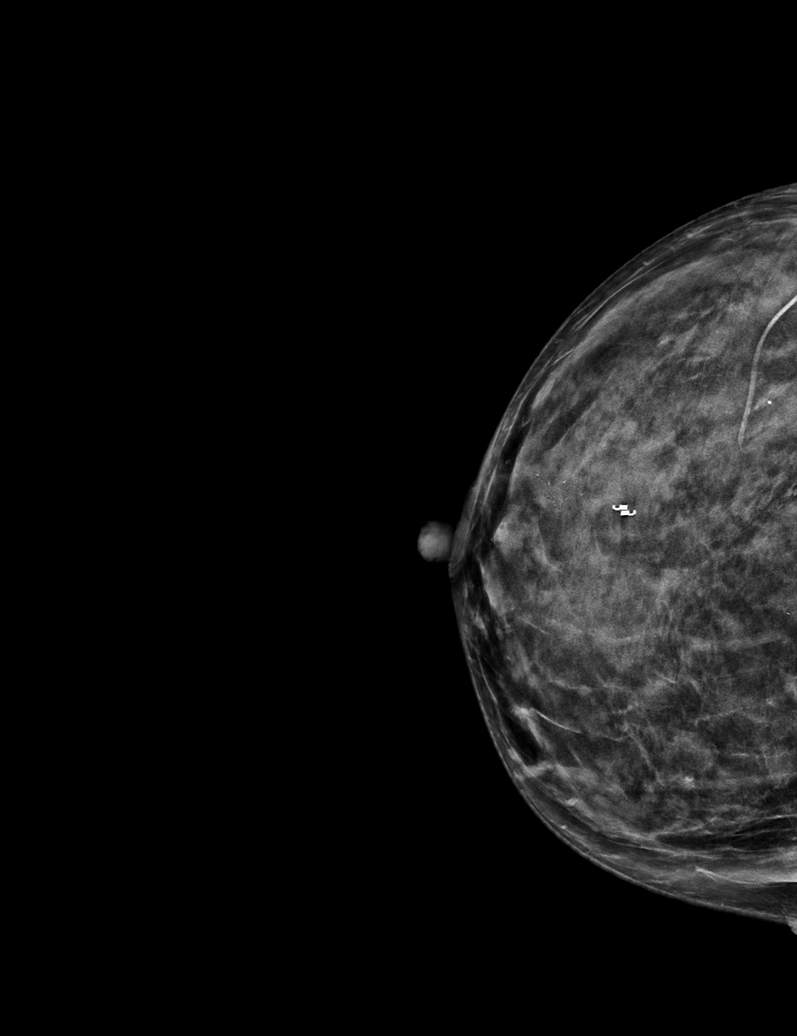

[R CC tomo · tomo slice 29/56.0]
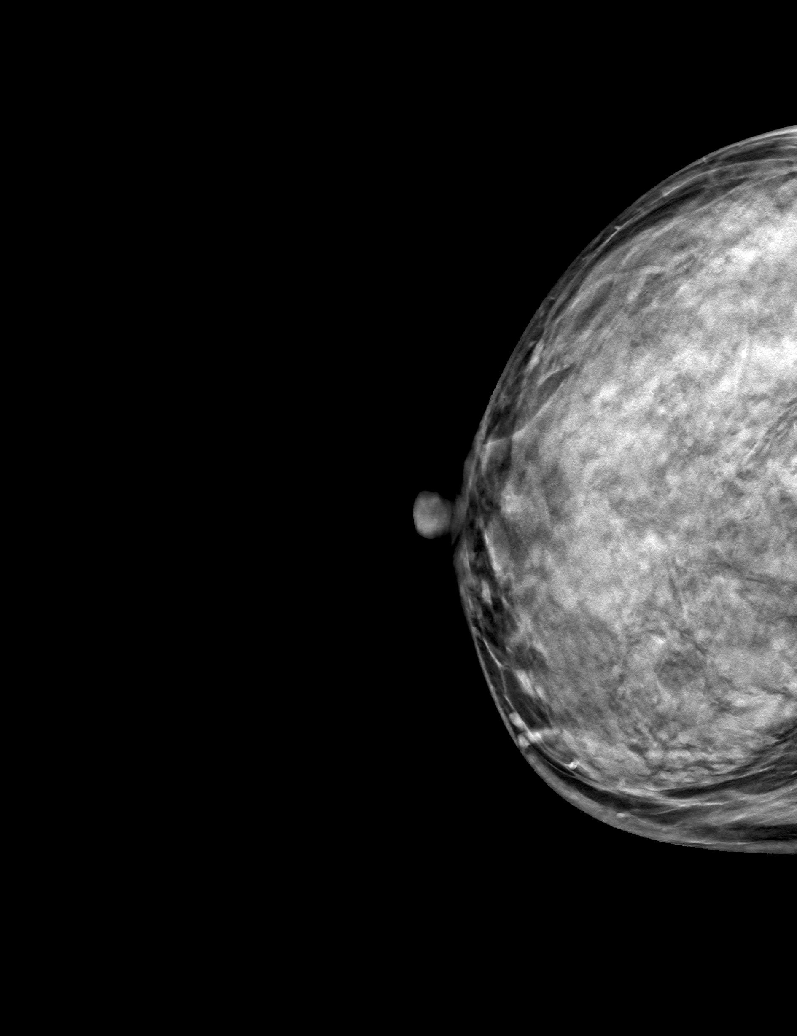

[6 of 30 positions shown; findings below may reference images not displayed]

ACR Breast Density Category d: The breast tissue is extremely dense,
which lowers the sensitivity of mammography.
FINDINGS: In the right breast, calcifications warrant further evaluation with
magnified views. In the left breast, no findings suspicious for
malignancy. Images were processed with CAD.
IMPRESSION: Further evaluation is suggested for calcifications in the right
breast.

RECOMMENDATION:
Diagnostic mammogram of the right breast. (Code:[0D])

The patient will be contacted regarding the findings, and additional
imaging will be scheduled.

BI-RADS CATEGORY  0: Incomplete. Need additional imaging evaluation
and/or prior mammograms for comparison.

## 2019-02-23 ENCOUNTER — Other Ambulatory Visit: Payer: Self-pay | Admitting: Obstetrics and Gynecology

## 2019-02-23 DIAGNOSIS — R928 Other abnormal and inconclusive findings on diagnostic imaging of breast: Secondary | ICD-10-CM

## 2019-02-23 LAB — NOVEL CORONAVIRUS, NAA: SARS-CoV-2, NAA: NOT DETECTED

## 2019-02-26 ENCOUNTER — Other Ambulatory Visit: Payer: Self-pay | Admitting: Obstetrics and Gynecology

## 2019-02-26 ENCOUNTER — Other Ambulatory Visit: Payer: Self-pay

## 2019-02-26 ENCOUNTER — Ambulatory Visit
Admission: RE | Admit: 2019-02-26 | Discharge: 2019-02-26 | Disposition: A | Discharge status: Home / Self Care | Payer: BC Managed Care – PPO | Source: Ambulatory Visit | Attending: Obstetrics and Gynecology | Admitting: Obstetrics and Gynecology

## 2019-02-26 DIAGNOSIS — R921 Mammographic calcification found on diagnostic imaging of breast: Secondary | ICD-10-CM

## 2019-02-26 DIAGNOSIS — R928 Other abnormal and inconclusive findings on diagnostic imaging of breast: Secondary | ICD-10-CM

## 2019-02-26 IMAGING — MG DIGITAL DIAGNOSTIC UNILAT RIGHT W/ CAD
8 series · 8 of 8 positions shown · non-contrast
Comparison: Previous exam(s).

CLINICAL DATA: Right breast calcifications on a recent screening
mammogram. Multiple previous right breast needle and excisional
biopsies in the past for benign calcifications, including
fibrocystic changes calcifications.

EXAM:
DIGITAL DIAGNOSTIC RIGHT MAMMOGRAM WITH CAD

[R ML (1 of 4)]
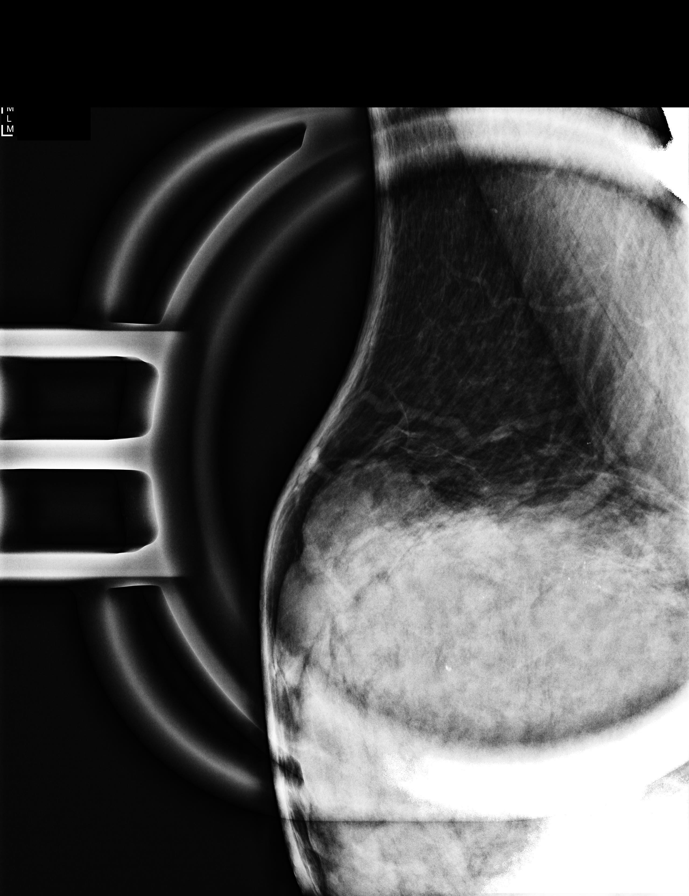

[R CC (1 of 2)]
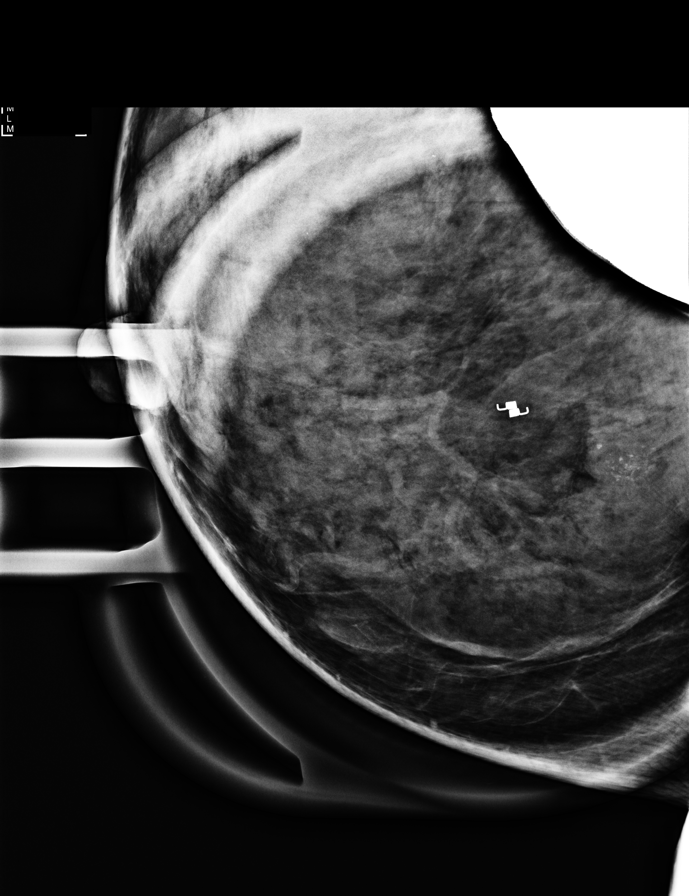

[R ML (2 of 4)]
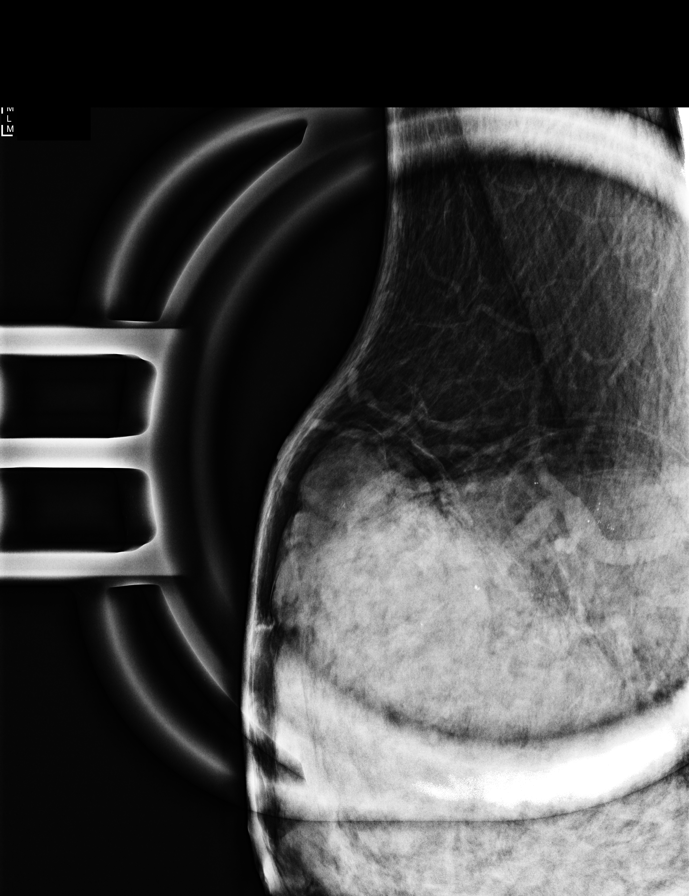

[R ML (3 of 4)]
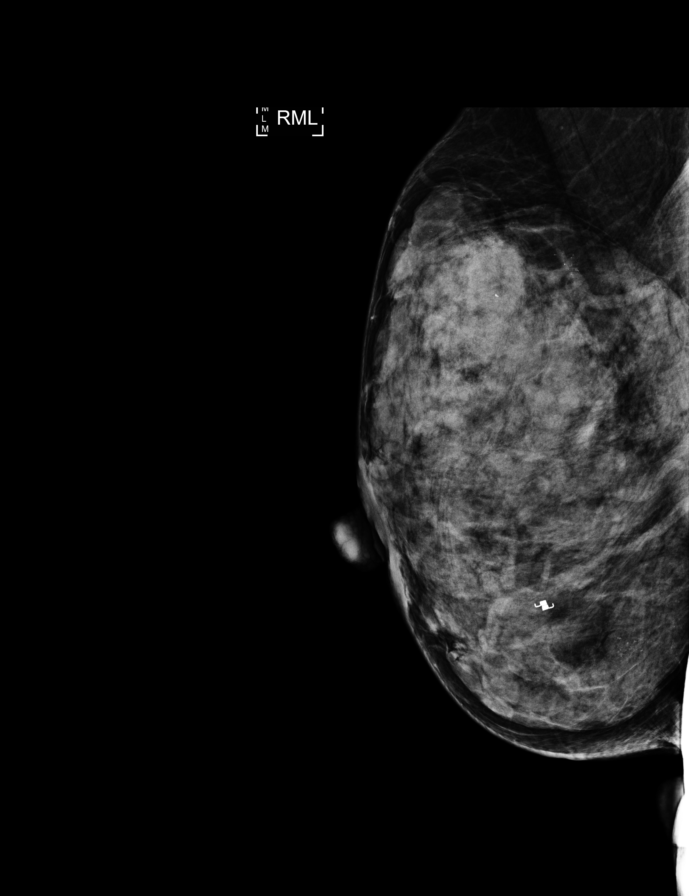

[R CC (2 of 2)]
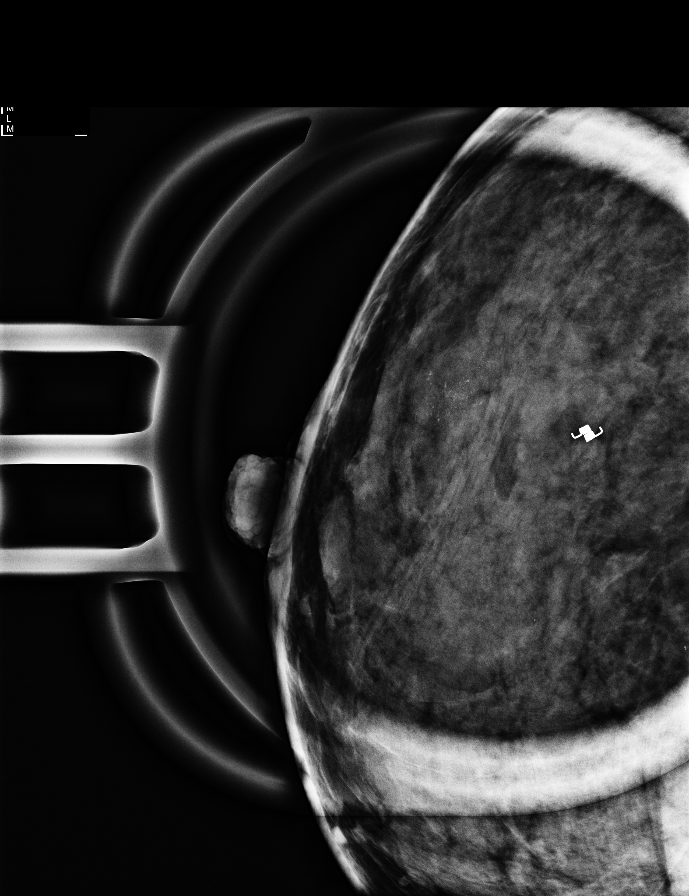

[R XCCL (1 of 2)]
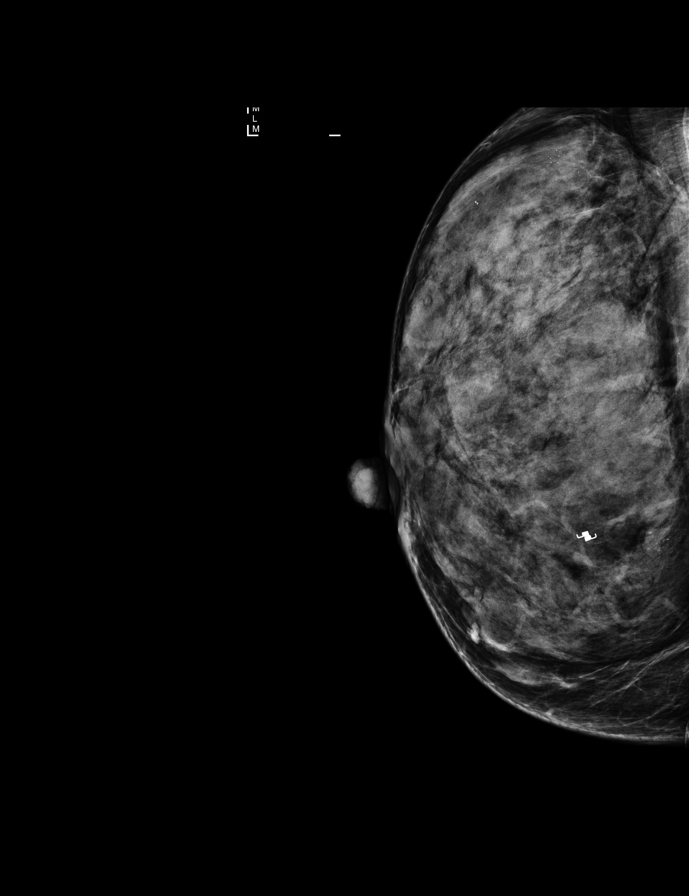

[R XCCL (2 of 2)]
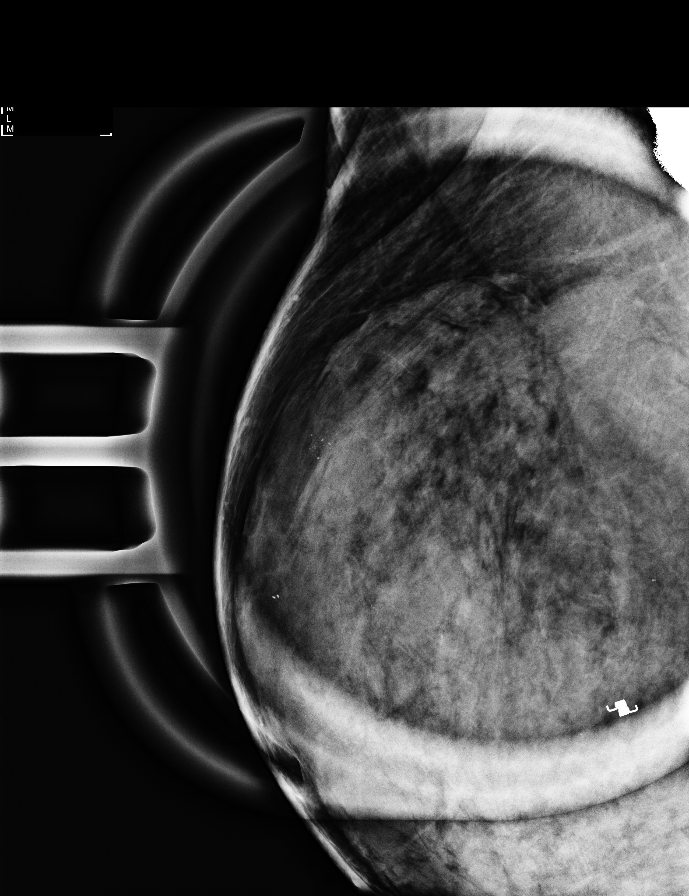

[R ML (4 of 4)]
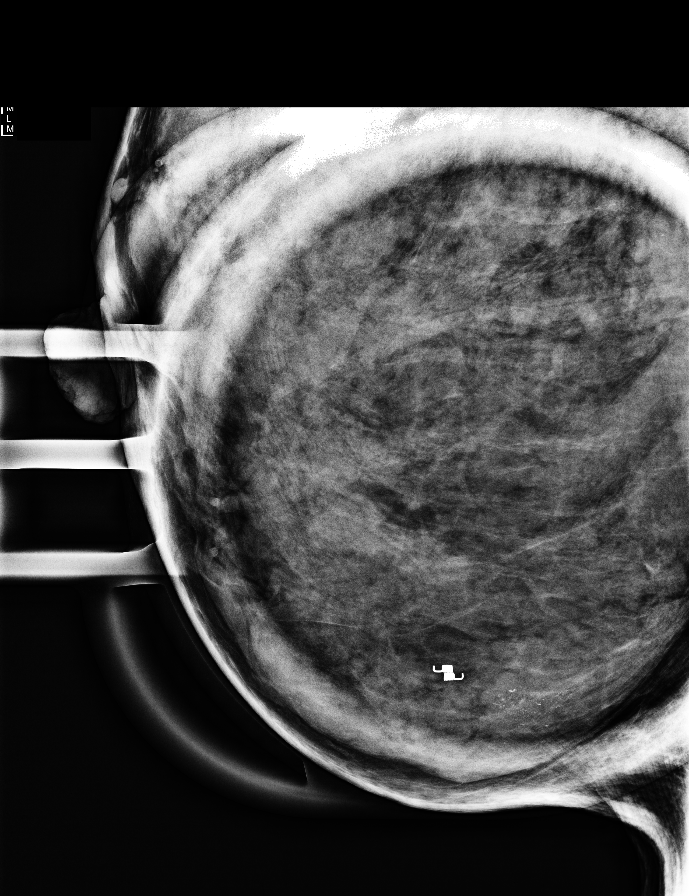

[8 of 8 positions shown; findings below may reference images not displayed]

ACR Breast Density Category d: The breast tissue is extremely dense,
which lowers the sensitivity of mammography.
FINDINGS: Full paddle and multiple spot magnification views of the right
breast were obtained. These demonstrate multiple groups of
calcifications in the breast which are intermittently seen on
previous mammograms dating back many years. Some of these
demonstrate dependent layering, compatible with benign milk of
calcium. There are no interval calcifications or other findings
suspicious for malignancy.

Mammographic images were processed with CAD.
IMPRESSION: No evidence of malignancy.

RECOMMENDATION:
Bilateral screening mammogram in 1 year.

I have discussed the findings and recommendations with the patient.
If applicable, a reminder letter will be sent to the patient
regarding the next appointment.

BI-RADS CATEGORY  2: Benign.

## 2019-03-09 ENCOUNTER — Ambulatory Visit: Payer: BC Managed Care – PPO | Admitting: Family Medicine

## 2019-07-02 DIAGNOSIS — N951 Menopausal and female climacteric states: Secondary | ICD-10-CM | POA: Diagnosis not present

## 2019-07-02 DIAGNOSIS — E039 Hypothyroidism, unspecified: Secondary | ICD-10-CM | POA: Diagnosis not present

## 2019-07-13 ENCOUNTER — Other Ambulatory Visit: Payer: Self-pay

## 2019-07-13 ENCOUNTER — Encounter: Payer: Self-pay | Admitting: Plastic Surgery

## 2019-07-13 ENCOUNTER — Ambulatory Visit: Payer: BC Managed Care – PPO | Admitting: Plastic Surgery

## 2019-07-13 DIAGNOSIS — D099 Carcinoma in situ, unspecified: Secondary | ICD-10-CM | POA: Diagnosis not present

## 2019-07-13 MED ORDER — LIDOCAINE-PRILOCAINE 2.5-2.5 % EX CREA: 1.0000 "application " | TOPICAL_CREAM | CUTANEOUS | 0 refills | Status: AC | PRN

## 2019-07-13 NOTE — Progress Notes (Signed)
Patient ID: Kaitlyn Lopez, female    DOB: 1960-06-06, 59 y.o.   MRN: [id number]   Chief Complaint  Patient presents with  . Advice Only    for carcinoma in situ chest  . Skin Problem    The patient is a 59 year old female here for evaluation of her skin.  She underwent a biopsy at the dermatology office of her chest area.  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  Due to no margins she was sent here for reexcision.  She is otherwise in good health.  She has had melanoma on her back in the past.  She also noted a 3 mm nodule on the anterior aspect of her right neck.  This feels like a inflamed lymph node.  It was mobile small and nontender.  She had some dental work done recently that left her mouth with multiple ulcerations.  It is likely due to that.  If it does not clear up in a week or 2 then I would definitely have her talk to her primary care physician.   Review of Systems  Constitutional: Negative.   HENT: Negative.   Eyes: Negative.   Respiratory: Negative.   Cardiovascular: Negative.   Gastrointestinal: Negative.   Endocrine: Negative.   Genitourinary: Negative.   Hematological: Negative.     Past Medical History:  Diagnosis Date  . Anemia   . Anxiety   . Contact lens/glasses fitting    wears contacts or glasses  . Depression   . Hypothyroidism   . Thyroid disease     Past Surgical History:  Procedure Laterality Date  . BREAST BIOPSY Right 01/08/2013   Procedure: RIGHT BREAST WITH NEEDLE LOCALIZATION;  Surgeon: Marcello Moores A. Cornett, MD;  Location: [place];  Service: General;  Laterality: Right;  . BREAST EXCISIONAL BIOPSY    . BREAST SURGERY  1992   lumpectomy-lt neg  . TRANSFORAMINAL LUMBAR INTERBODY FUSION W/ MIS 4 LEVEL  2011      Current Outpatient Medications:  .  Ascorbic Acid (VITAMIN C) 1000 MG tablet, Take 1,000 mg by mouth daily., Disp: , Rfl:  .  b complex vitamins tablet, Take 1 tablet by mouth daily., Disp:  , Rfl:  .  cholecalciferol (VITAMIN D) 1000 UNITS tablet, Take 1,000 Units by mouth daily., Disp: , Rfl:  .  DULoxetine (CYMBALTA) 60 MG capsule, TK 1 C PO QD, Disp: , Rfl: 0 .  estradiol (ESTRACE) 0.5 MG tablet, estradiol 0.5 mg tablet, Disp: , Rfl:  .  NP THYROID 60 MG tablet, Take as directed., Disp: , Rfl:  .  progesterone (PROMETRIUM) 100 MG capsule, TK 3 CS PO QHS, Disp: , Rfl: 3   Objective:   Vitals:   07/13/19 0941  BP: 124/68  Pulse: 81  Temp: (!) 97.5 F (36.4 C)  SpO2: 97%    Physical Exam Vitals and nursing note reviewed.  Constitutional:      Appearance: Normal appearance.  HENT:     Head: Normocephalic and atraumatic.  Cardiovascular:     Rate and Rhythm: Normal rate.     Pulses: Normal pulses.  Pulmonary:     Effort: Pulmonary effort is normal.  Chest:    Abdominal:     General: Abdomen is flat. There is no distension.  Neurological:     General: No focal deficit present.     Mental Status: She is alert and oriented to person, place, and time.  Psychiatric:  Mood and Affect: Mood normal.        Behavior: Behavior normal.        Thought Content: Thought content normal.     Assessment & Plan:  Squamous cell carcinoma in situ  Plan on excision of squamous cell carcinoma in situ of her chest.  I will try to send in EMLA cream for her. Pictures were obtained of the patient and placed in the chart with the patient's or guardian's permission.   Grover, DO

## 2019-07-20 DIAGNOSIS — M1712 Unilateral primary osteoarthritis, left knee: Secondary | ICD-10-CM | POA: Diagnosis not present

## 2019-09-03 ENCOUNTER — Ambulatory Visit (INDEPENDENT_AMBULATORY_CARE_PROVIDER_SITE_OTHER): Payer: BC Managed Care – PPO | Admitting: Plastic Surgery

## 2019-09-03 ENCOUNTER — Other Ambulatory Visit: Payer: Self-pay

## 2019-09-03 ENCOUNTER — Encounter: Payer: Self-pay | Admitting: Plastic Surgery

## 2019-09-03 VITALS — BP 115/66 | HR 74 | Temp 97.8°F

## 2019-09-03 DIAGNOSIS — D099 Carcinoma in situ, unspecified: Secondary | ICD-10-CM

## 2019-09-03 NOTE — Progress Notes (Signed)
Appointment is rescheduled.  Patient has an appointment with Dr. Delman Cheadle next week.  She will confirm the exact location at that time.  We will fit her in and get her back to see [place].  She was given a sample of ZO sunblock as she inquired about attempting sunblock.

## 2019-09-15 DIAGNOSIS — D045 Carcinoma in situ of skin of trunk: Secondary | ICD-10-CM | POA: Diagnosis not present

## 2019-09-15 DIAGNOSIS — L820 Inflamed seborrheic keratosis: Secondary | ICD-10-CM | POA: Diagnosis not present

## 2019-09-15 DIAGNOSIS — L570 Actinic keratosis: Secondary | ICD-10-CM | POA: Diagnosis not present

## 2019-10-04 ENCOUNTER — Ambulatory Visit (INDEPENDENT_AMBULATORY_CARE_PROVIDER_SITE_OTHER): Payer: BC Managed Care – PPO | Admitting: Plastic Surgery

## 2019-10-04 ENCOUNTER — Other Ambulatory Visit: Payer: Self-pay

## 2019-10-04 ENCOUNTER — Other Ambulatory Visit (HOSPITAL_COMMUNITY)
Admission: RE | Admit: 2019-10-04 | Discharge: 2019-10-04 | Disposition: A | Discharge status: Home / Self Care | Payer: BC Managed Care – PPO | Source: Ambulatory Visit | Attending: Plastic Surgery | Admitting: Plastic Surgery

## 2019-10-04 ENCOUNTER — Encounter: Payer: Self-pay | Admitting: Plastic Surgery

## 2019-10-04 VITALS — BP 113/73 | HR 76 | Temp 98.2°F

## 2019-10-04 DIAGNOSIS — C4492 Squamous cell carcinoma of skin, unspecified: Secondary | ICD-10-CM | POA: Insufficient documentation

## 2019-10-04 DIAGNOSIS — C44529 Squamous cell carcinoma of skin of other part of trunk: Secondary | ICD-10-CM | POA: Diagnosis not present

## 2019-10-04 NOTE — Progress Notes (Signed)
Procedure Note  Preoperative Dx: Positive biopsy for squamous cell carcinoma of the chest  Postoperative Dx: Same  Procedure: Excision of squamous cell carcinoma of the chest 5 x 10 mm  Anesthesia: Lidocaine 1% with 1:100,000 epinepherine  Indication for Procedure: Squamous cell carcinoma  Description of Procedure: Risks and complications were explained to the patient.  Consent was confirmed and the patient understands the risks and benefits.  The potential complications and alternatives were explained and the patient consents.  The patient expressed understanding the option of not having the procedure and the risks of a scar.  Time out was called and all information was confirmed to be correct.    The area was prepped and drapped.  Lidocaine 1% with epinepherine was injected in the subcutaneous area.  After waiting several minutes for the local to take affect a #15 blade was used to excise the area in an eliptical pattern with 27mm borders from the biopsy site.  A 5-0 Monocryl was used to close the deep layers with simple interrupted stitches.  The skin edges were reapproximated with 5-0 Monocryl subcuticular running closure.  A dressing was applied.  The patient was given instructions on how to care for the area and a follow up appointment.  Kaitlyn Lopez tolerated the procedure well and there were no complications. The specimen was sent to pathology.

## 2019-10-06 LAB — SURGICAL PATHOLOGY

## 2019-10-14 ENCOUNTER — Telehealth: Payer: Self-pay

## 2019-10-14 NOTE — Telephone Encounter (Signed)
Call to pt- no answer- left v/m requesting call back 

## 2019-10-15 ENCOUNTER — Other Ambulatory Visit: Payer: Self-pay

## 2019-10-15 ENCOUNTER — Encounter: Payer: Self-pay | Admitting: Plastic Surgery

## 2019-10-15 ENCOUNTER — Ambulatory Visit (INDEPENDENT_AMBULATORY_CARE_PROVIDER_SITE_OTHER): Payer: BC Managed Care – PPO | Admitting: Plastic Surgery

## 2019-10-15 VITALS — BP 124/82 | HR 70 | Temp 98.3°F

## 2019-10-15 DIAGNOSIS — Z719 Counseling, unspecified: Secondary | ICD-10-CM

## 2019-10-15 DIAGNOSIS — D099 Carcinoma in situ, unspecified: Secondary | ICD-10-CM

## 2019-10-15 NOTE — Progress Notes (Signed)
The patient is a 59 year old female here for follow-up on her squamous cell carcinoma excision of her chest.  Pathology showed negative margins.  The area is healing well.  There is no sign of infection.  The sutures were removed.  Steri-Strip was applied.  She is encouraged to get the skinuva and do that twice a day for 3 months.  Follow-up as needed.

## 2019-11-24 ENCOUNTER — Other Ambulatory Visit: Payer: Self-pay

## 2019-11-24 ENCOUNTER — Encounter: Payer: Self-pay | Admitting: Family Medicine

## 2019-11-24 ENCOUNTER — Ambulatory Visit: Payer: Self-pay

## 2019-11-24 ENCOUNTER — Ambulatory Visit: Payer: BC Managed Care – PPO | Admitting: Family Medicine

## 2019-11-24 DIAGNOSIS — M25511 Pain in right shoulder: Secondary | ICD-10-CM | POA: Diagnosis not present

## 2019-11-24 DIAGNOSIS — G8929 Other chronic pain: Secondary | ICD-10-CM | POA: Diagnosis not present

## 2019-11-24 NOTE — Progress Notes (Signed)
Office Visit Note   Patient: Kaitlyn Lopez           Date of Birth: June 06, 1960           MRN: [id number] Visit Date: 11/24/2019 Requested by: Donald Prose, MD [place] [place],  [place] [address] PCP: Donald Prose, MD  Subjective: Chief Complaint  Patient presents with  . Right Shoulder - Pain    HPI: She is here at the request of Kym Groom for right shoulder pain.  She is status post partial rotator cuff tear years ago.  It with treated nonsurgically and eventually she became pain-free.  In the past 7 or 8 months she has had pain in the lateral deltoid area with no injury.  She has been doing physical therapy and getting dry needling treatments but the pain does not seem to be going okay.  It hurts when holding her arm forward such as when painting.  Denies any neck pain associated with this, denies any numbness or tingling in her arm.  She has not noticed any weakness.  She is not taking any medication for her pain.  I have seen her two years ago for right wrist pain.  She still has some pain but it is tolerable.              ROS:   All other systems were reviewed and are negative.  Objective: Vital Signs: There were no vitals taken for this visit.  Physical Exam:  General:  Alert and oriented, in no acute distress. Pulm:  Breathing unlabored. Psy:  Normal mood, congruent affect. Skin: No rash Right shoulder: Full active range of motion with no adhesive capsulitis.  Negative Spurling's test of the neck.  No tenderness at the [facility] joint but she does have pain with AC crossover test.  Empty can test is negative and she has 5/5 supraspinatus strength.  No pain with internal or external rotation against resistance.  Speeds test is negative.  Deltoid strength and triceps strength are 5/5 but she has 4/5 weakness with right biceps flexion compared to 5/5 strength on the left.  Wrist and intrinsic hand strength are normal.  She has 2+ upper extremity  DTRs.  Imaging: US Guided Needle Placement  Result Date: 11/24/2019 Diagnostic ultrasound right shoulder: Long head biceps tendon is located in its groove and has normal appearance all the way to the pectoralis tendon.  Sono palpation over the biceps does not recreate her pain.  Subscapularis tendon looks normal.  Supraspinatus has thinning in the midportion.  It has normal appearance anteriorly and posteriorly.  No definite tear seen in the supraspinatus.  Infraspinatus tendon is intact, no obvious paralabral cyst posteriorly.  Directly over the area of tenderness in the distal lateral deltoid area, there is an artery, probably the posterior circumflex.  The overlying muscle appears normal.   Assessment & Plan: 1.  Chronic right shoulder pain, etiology uncertain.  With biceps weakness, concern is for nerve impingement. -Discussed options with her and elected to inject with cortisone at the area of maximum tenderness.  She will contact me next week and if not feeling any better, will order nerve conduction studies.     Procedures: Right shoulder injection: After sterile prep with Betadine, injected 3 cc 1% lidocaine without epinephrine and 40 mg methylprednisolone into the area of tenderness lateral deltoid area.  She had some improvement in pain during the anesthetic phase.    PMFS History: Patient Active Problem List  Diagnosis Date Noted  . Squamous cell carcinoma in situ 07/13/2019  . Lumbar degenerative disc disease 11/22/2014  . Lumbar radicular pain 11/22/2014  . Chronic pain syndrome 08/03/2014   Past Medical History:  Diagnosis Date  . Anemia   . Anxiety   . Contact lens/glasses fitting    wears contacts or glasses  . Depression   . Hypothyroidism   . Thyroid disease     Family History  Problem Relation Age of Onset  . Cancer Maternal Grandfather        breast  . Breast cancer Paternal Grandmother     Past Surgical History:  Procedure Laterality Date  . BREAST  BIOPSY Right 01/08/2013   Procedure: RIGHT BREAST WITH NEEDLE LOCALIZATION;  Surgeon: Marcello Moores A. Cornett, MD;  Location: [facility];  Service: General;  Laterality: Right;  . BREAST EXCISIONAL BIOPSY    . BREAST SURGERY  1992   lumpectomy-lt neg  . TRANSFORAMINAL LUMBAR INTERBODY FUSION W/ MIS 4 LEVEL  2011   Social History   Occupational History  . Not on file  Tobacco Use  . Smoking status: Never Smoker  . Smokeless tobacco: Never Used  Substance and Sexual Activity  . Alcohol use: Yes  . Drug use: No  . Sexual activity: Not on file

## 2019-12-06 DIAGNOSIS — Z681 Body mass index (BMI) 19 or less, adult: Secondary | ICD-10-CM | POA: Diagnosis not present

## 2019-12-06 DIAGNOSIS — Z13820 Encounter for screening for osteoporosis: Secondary | ICD-10-CM | POA: Diagnosis not present

## 2019-12-06 DIAGNOSIS — Z01419 Encounter for gynecological examination (general) (routine) without abnormal findings: Secondary | ICD-10-CM | POA: Diagnosis not present

## 2019-12-07 ENCOUNTER — Other Ambulatory Visit: Payer: Self-pay | Admitting: Radiology

## 2019-12-07 ENCOUNTER — Other Ambulatory Visit: Payer: Self-pay | Admitting: Family Medicine

## 2019-12-07 DIAGNOSIS — Z803 Family history of malignant neoplasm of breast: Secondary | ICD-10-CM

## 2019-12-07 DIAGNOSIS — Z1231 Encounter for screening mammogram for malignant neoplasm of breast: Secondary | ICD-10-CM

## 2020-01-04 DIAGNOSIS — N951 Menopausal and female climacteric states: Secondary | ICD-10-CM | POA: Diagnosis not present

## 2020-01-04 DIAGNOSIS — E039 Hypothyroidism, unspecified: Secondary | ICD-10-CM | POA: Diagnosis not present

## 2020-01-04 DIAGNOSIS — E559 Vitamin D deficiency, unspecified: Secondary | ICD-10-CM | POA: Diagnosis not present

## 2020-01-15 ENCOUNTER — Ambulatory Visit
Admission: RE | Admit: 2020-01-15 | Discharge: 2020-01-15 | Disposition: A | Discharge status: Home / Self Care | Payer: BC Managed Care – PPO | Source: Ambulatory Visit | Attending: Radiology | Admitting: Radiology

## 2020-01-15 ENCOUNTER — Other Ambulatory Visit: Payer: Self-pay

## 2020-01-15 DIAGNOSIS — N6489 Other specified disorders of breast: Secondary | ICD-10-CM | POA: Diagnosis not present

## 2020-01-15 DIAGNOSIS — Z803 Family history of malignant neoplasm of breast: Secondary | ICD-10-CM

## 2020-01-15 IMAGING — MR MR BREAST BILAT WO/W CM
8 of 12 series · 33 of 48 positions shown · IV contrast (9ml gadavist)
Comparison: No prior MRI available for comparison. Correlation made
with prior mammograms and ultrasound images.

CLINICAL DATA: 59-year-old female presenting for screening MRI. She
has extremely dense breast tissue and family history of breast
cancer including a maternal grandfather. She has prior excisional
biopsy in the right breast for benign calcifications with subsequent
benign stereotactic biopsy for calcifications.

LABS:  None.
EXAM:
BILATERAL BREAST MRI WITH AND WITHOUT CONTRAST
TECHNIQUE: Multiplanar, multisequence MR images of both breasts were obtained
prior to and following the intravenous administration of 10 ml of
Gadavist

[Series 4: t2_tirm_tra ipat (a-p) · axial · 3.0mm · 0.64mm/px · 1 of 55 slices shown]
[im 1/55]
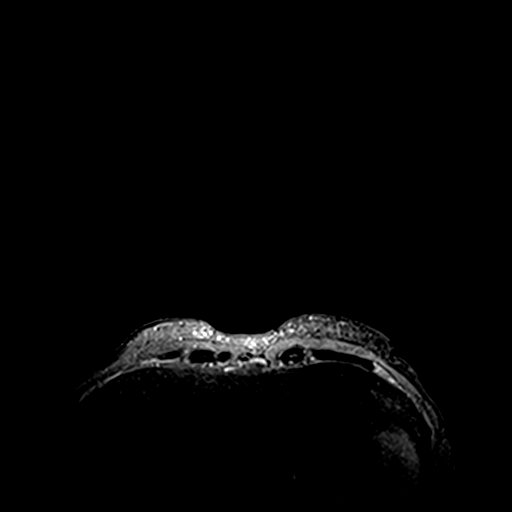

[Series 5: fl3d pre-cm no · axial · non-contrast · 1.2mm · 0.86mm/px · z∈[-89,+83]mm · 5 of 144 slices shown]
[im 1/144]
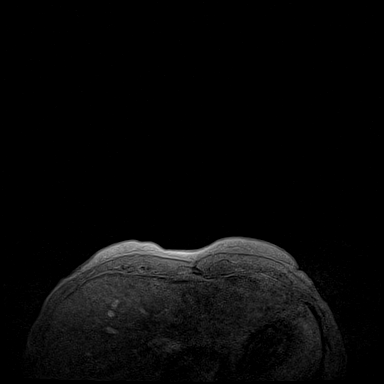
[im 36/144]
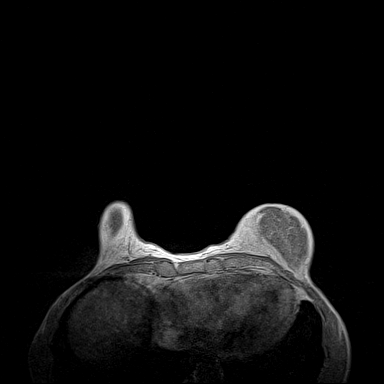
[im 72/144]
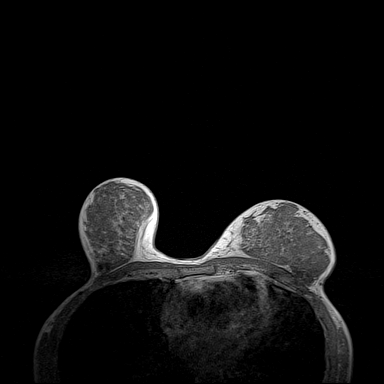
[im 108/144]
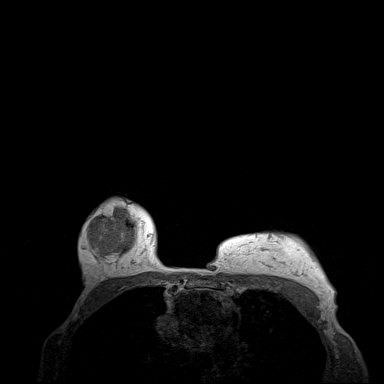
[im 144/144]
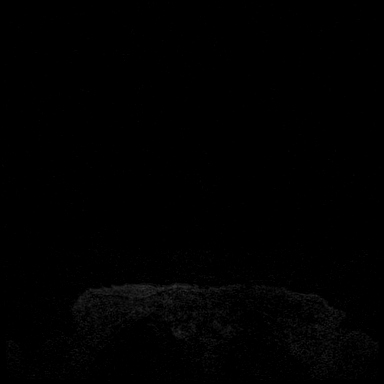

[Series 6: fl3d pre-cm · axial · non-contrast · 1.2mm · 0.86mm/px · z∈[-89,+83]mm · 5 of 144 slices shown]
[im 1/144]
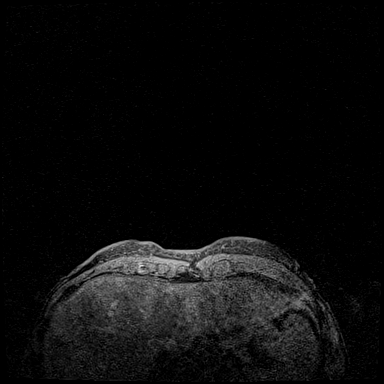
[im 36/144]
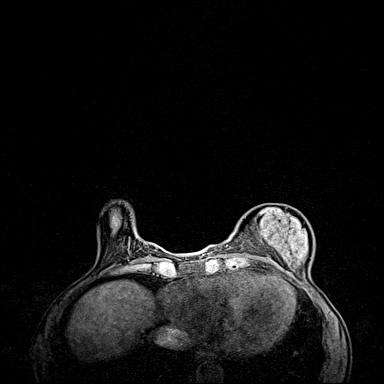
[im 72/144]
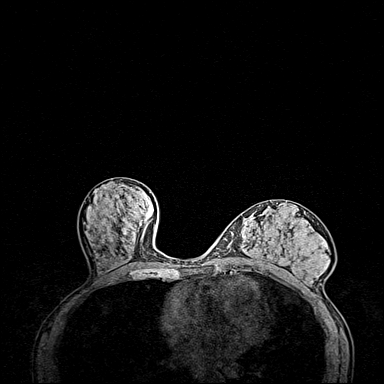
[im 108/144]
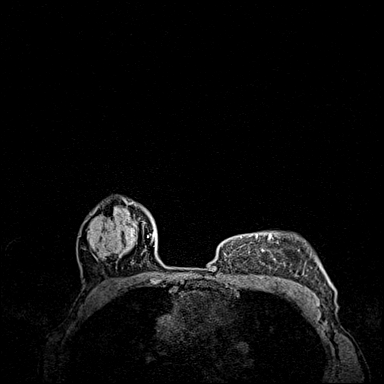
[im 144/144]
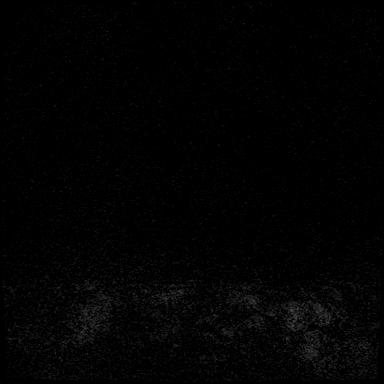

[Series 7: fl3d post immediate · axial · 1.2mm · 0.86mm/px · z∈[-89,+83]mm · 5 of 144 slices shown (1 of 3)]
[im 1/144]
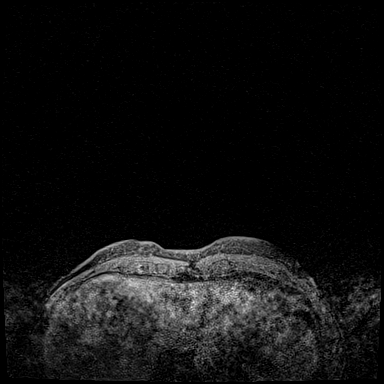
[im 36/144]
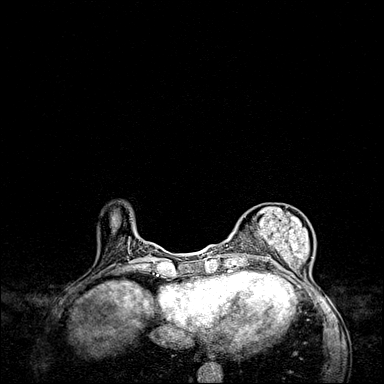
[im 72/144]
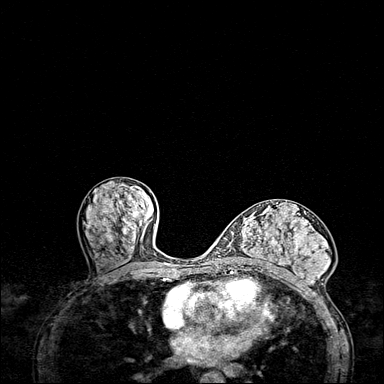
[im 108/144]
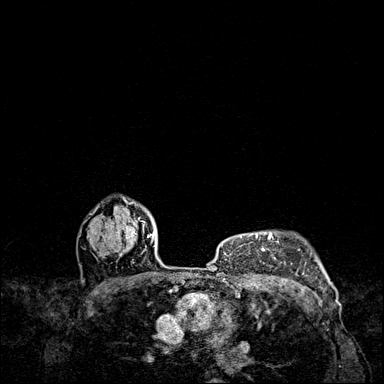
[im 144/144]
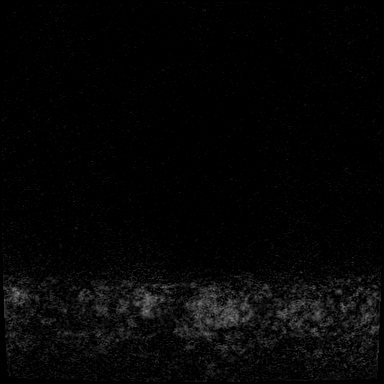

[Series 8: fl3d post immediate · axial · 1.2mm · 0.86mm/px · z∈[-89,+83]mm · 5 of 144 slices shown (2 of 3)]
[im 1/144]
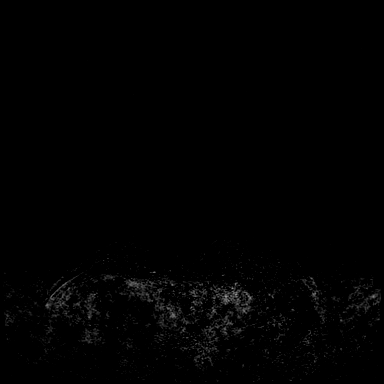
[im 36/144]
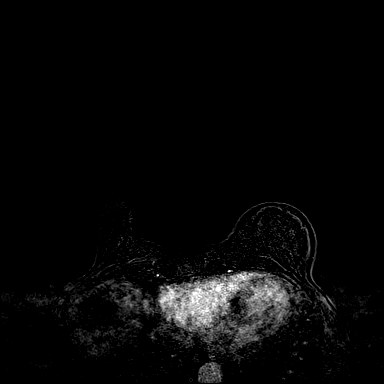
[im 72/144]
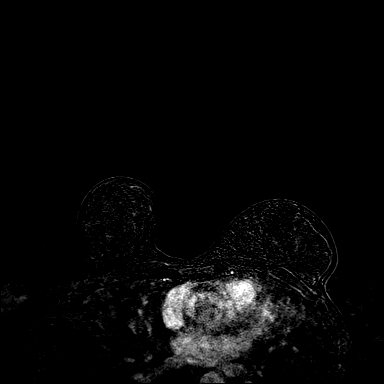
[im 108/144]
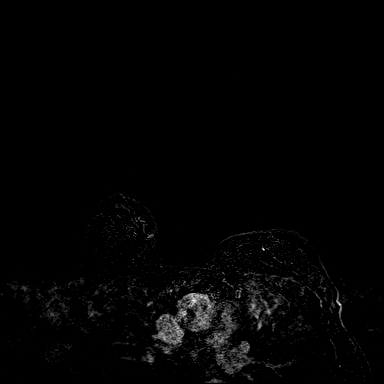
[im 144/144]
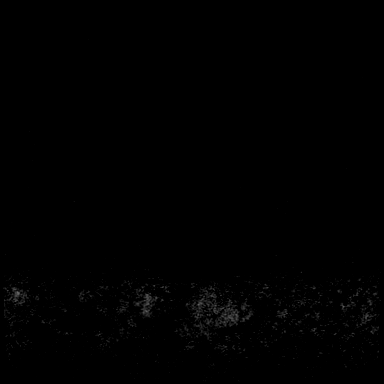

[Series 9: fl3d post immediate · axial · 172.8mm · 0.86mm/px · 1 of 1 slices shown (3 of 3)]
[im 1/1]
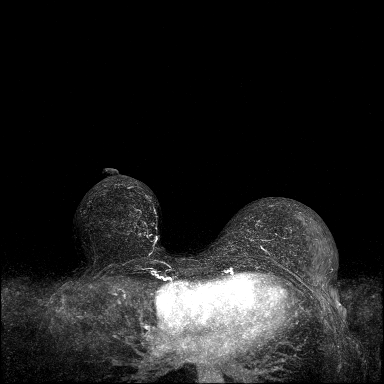

[Series 10: fl3d post 3min · axial · 1.2mm · 0.86mm/px · z∈[-89,+83]mm · 6 of 144 slices shown]
[im 1/144]
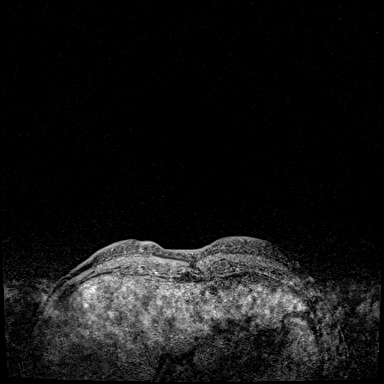
[im 29/144]
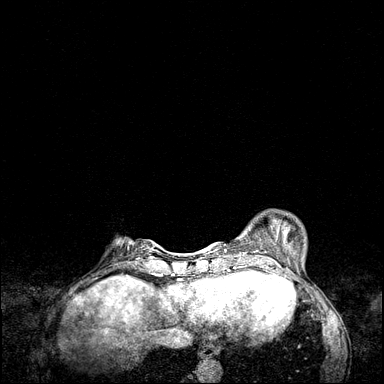
[im 58/144]
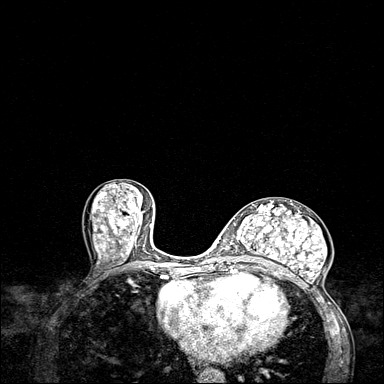
[im 86/144]
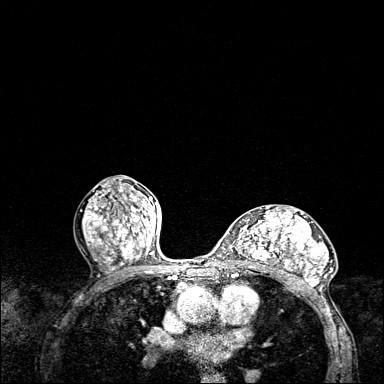
[im 115/144]
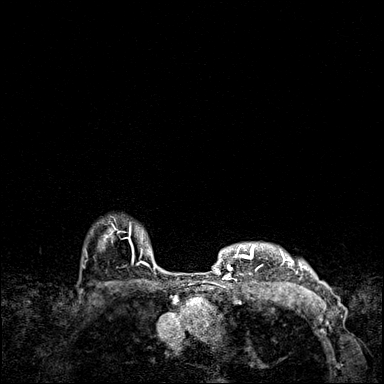
[im 144/144]
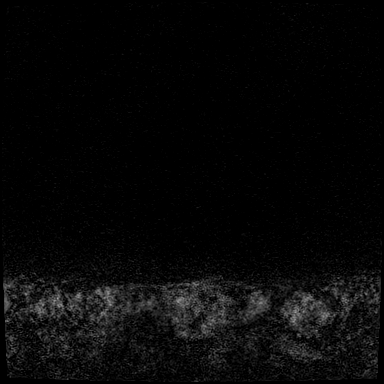

[Series 11: fl3d post 3min_sub · axial · 1.2mm · 0.86mm/px · z∈[-89,+48]mm · 5 of 144 slices shown]
[im 1/144]
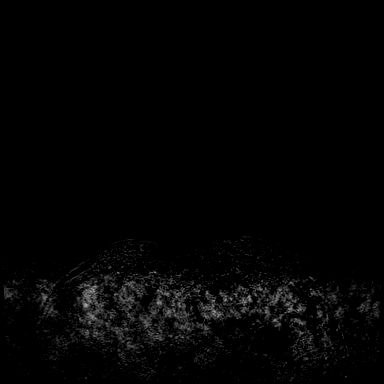
[im 29/144]
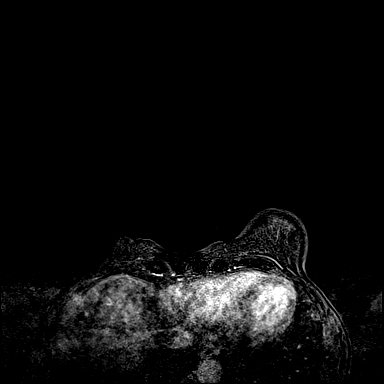
[im 58/144]
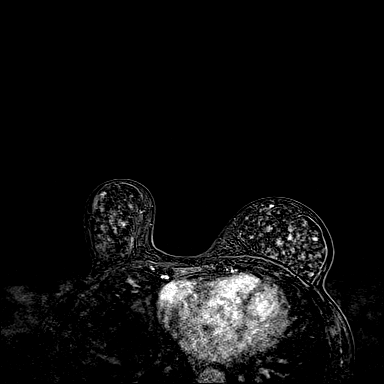
[im 86/144]
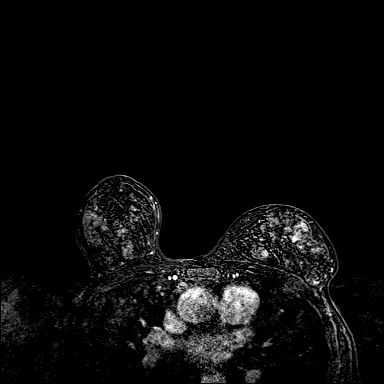
[im 115/144]
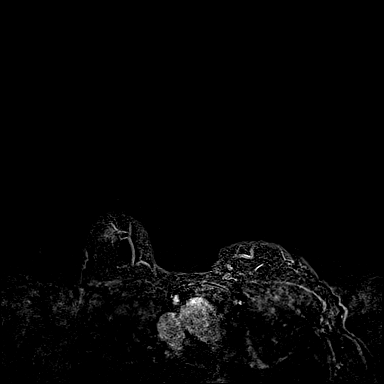

[33 of 48 positions shown; findings below may reference images not displayed]

Three-dimensional MR images were rendered by post-processing of the
original MR data on an independent workstation. The
three-dimensional MR images were interpreted, and findings are
reported in the following complete MRI report for this study. Three
dimensional images were evaluated at the independent interpreting
workstation using the DynaCAD thin client.
FINDINGS: Breast composition: d. Extreme fibroglandular tissue.

Background parenchymal enhancement: Mild

Right breast: No mass or abnormal enhancement.

Left breast: No mass or abnormal enhancement.

Lymph nodes: No abnormal appearing lymph nodes.

Ancillary findings:  None.
IMPRESSION: No MRI evidence of malignancy in the bilateral breasts.

RECOMMENDATION:
1.  Annual screening mammography recommended in [DATE].

2. Per American Cancer Society guidelines, if the patient has a
calculated lifetime risk of developing breast cancer of greater than
20%, annual screening MRI of the breasts would be recommended at the
time of screening mammography.

BI-RADS CATEGORY  1: Negative.

## 2020-01-15 MED ORDER — GADOBUTROL 1 MMOL/ML IV SOLN
10.0000 mL | Freq: Once | INTRAVENOUS | Status: AC | PRN
  Administered 2020-01-15: 10 mL via INTRAVENOUS

## 2020-01-15 MED ORDER — GADOBENATE DIMEGLUMINE 529 MG/ML IV SOLN: 10.0000 mL | Freq: Once | INTRAVENOUS | Status: DC | PRN

## 2020-01-17 ENCOUNTER — Encounter: Payer: Self-pay | Admitting: Family Medicine

## 2020-01-17 DIAGNOSIS — G8929 Other chronic pain: Secondary | ICD-10-CM

## 2020-02-07 ENCOUNTER — Ambulatory Visit: Payer: BC Managed Care – PPO

## 2020-02-07 ENCOUNTER — Other Ambulatory Visit: Payer: Self-pay

## 2020-02-07 ENCOUNTER — Ambulatory Visit: Admission: RE | Admit: 2020-02-07 | Payer: BC Managed Care – PPO | Source: Ambulatory Visit

## 2020-02-22 DIAGNOSIS — Z85828 Personal history of other malignant neoplasm of skin: Secondary | ICD-10-CM | POA: Diagnosis not present

## 2020-02-22 DIAGNOSIS — D225 Melanocytic nevi of trunk: Secondary | ICD-10-CM | POA: Diagnosis not present

## 2020-02-22 DIAGNOSIS — D2271 Melanocytic nevi of right lower limb, including hip: Secondary | ICD-10-CM | POA: Diagnosis not present

## 2020-02-22 DIAGNOSIS — L111 Transient acantholytic dermatosis [Grover]: Secondary | ICD-10-CM | POA: Diagnosis not present

## 2020-02-22 DIAGNOSIS — L570 Actinic keratosis: Secondary | ICD-10-CM | POA: Diagnosis not present

## 2020-02-23 ENCOUNTER — Other Ambulatory Visit: Payer: Self-pay

## 2020-02-23 ENCOUNTER — Ambulatory Visit
Admission: RE | Admit: 2020-02-23 | Discharge: 2020-02-23 | Disposition: A | Discharge status: Home / Self Care | Payer: BC Managed Care – PPO | Source: Ambulatory Visit | Attending: Family Medicine | Admitting: Family Medicine

## 2020-02-23 DIAGNOSIS — G8929 Other chronic pain: Secondary | ICD-10-CM

## 2020-02-23 DIAGNOSIS — M25511 Pain in right shoulder: Secondary | ICD-10-CM | POA: Diagnosis not present

## 2020-02-23 DIAGNOSIS — M67811 Other specified disorders of synovium, right shoulder: Secondary | ICD-10-CM | POA: Diagnosis not present

## 2020-02-23 IMAGING — XA DG ARTHROGRAM SHOULDER*R*
2 series · 2 of 2 positions shown · IV contrast (multihance)
Comparison: none

CLINICAL DATA: Chronic right shoulder pain.

EXAM:
RIGHT SHOULDER INJECTION UNDER FLUOROSCOPY
TECHNIQUE: An appropriate skin entrance site was determined. The site was
marked, prepped with Betadine, draped in the usual sterile fashion,
and infiltrated locally with 1% lidocaine. A 22 gauge spinal needle
was advanced to the superomedial margin of the humeral head under
intermittent fluoroscopy. 1 mL of 1% lidocaine injected easily. A
mixture of 0.1 mL of MultiHance, 15 mL of Isovue-M 200, and 5 mL of
sterile saline was then used to opacify the right shoulder capsule.
12 mL of this mixture were injected. No immediate complication.
FLUOROSCOPY TIME:  Fluoroscopy Time:  5 seconds
Radiation Exposure Index (if provided by the fluoroscopic device):
0.97 microGray*m^2
Number of Acquired Spot Images: 0

[Series 1: ortho standard · 1 of 1 slices shown (1 of 2)]
[im 1/1]
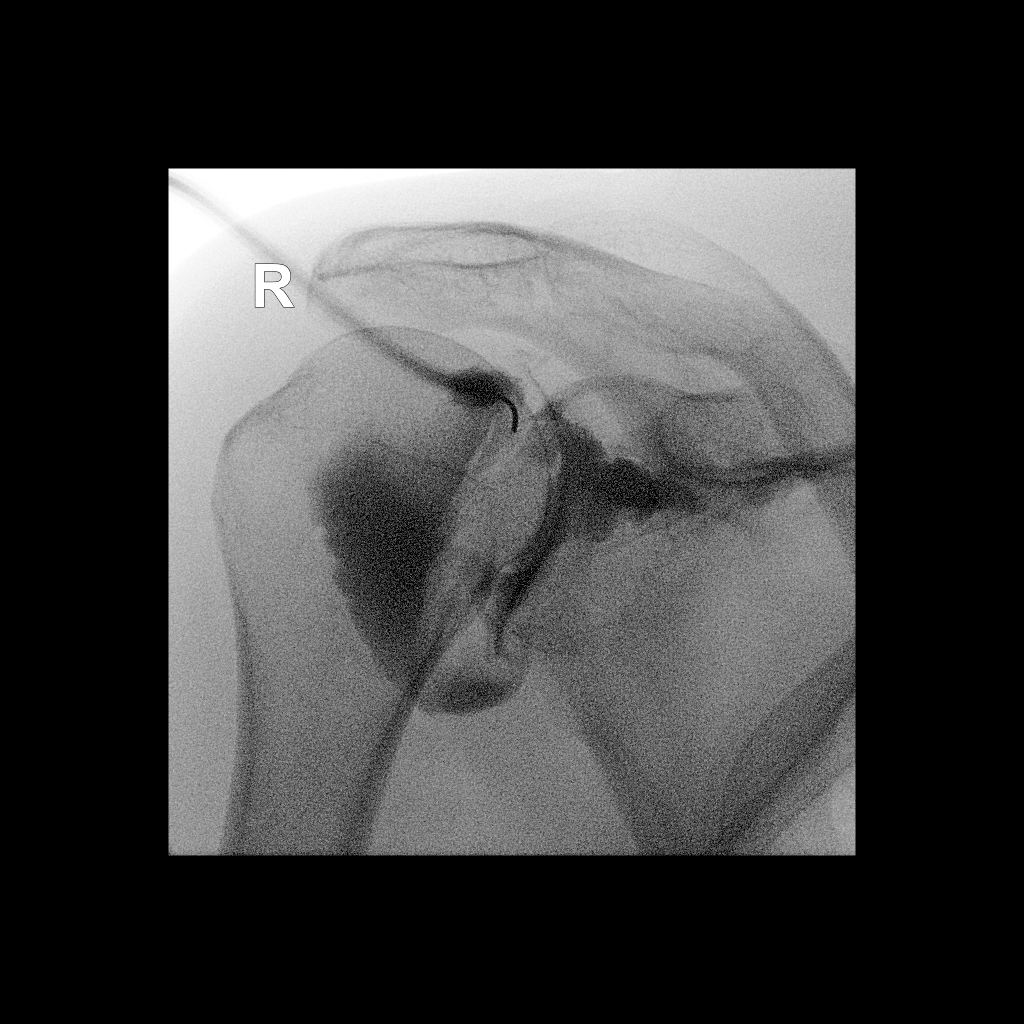

[Series 2: ortho standard · 1 of 1 slices shown (2 of 2)]
[im 1/1]
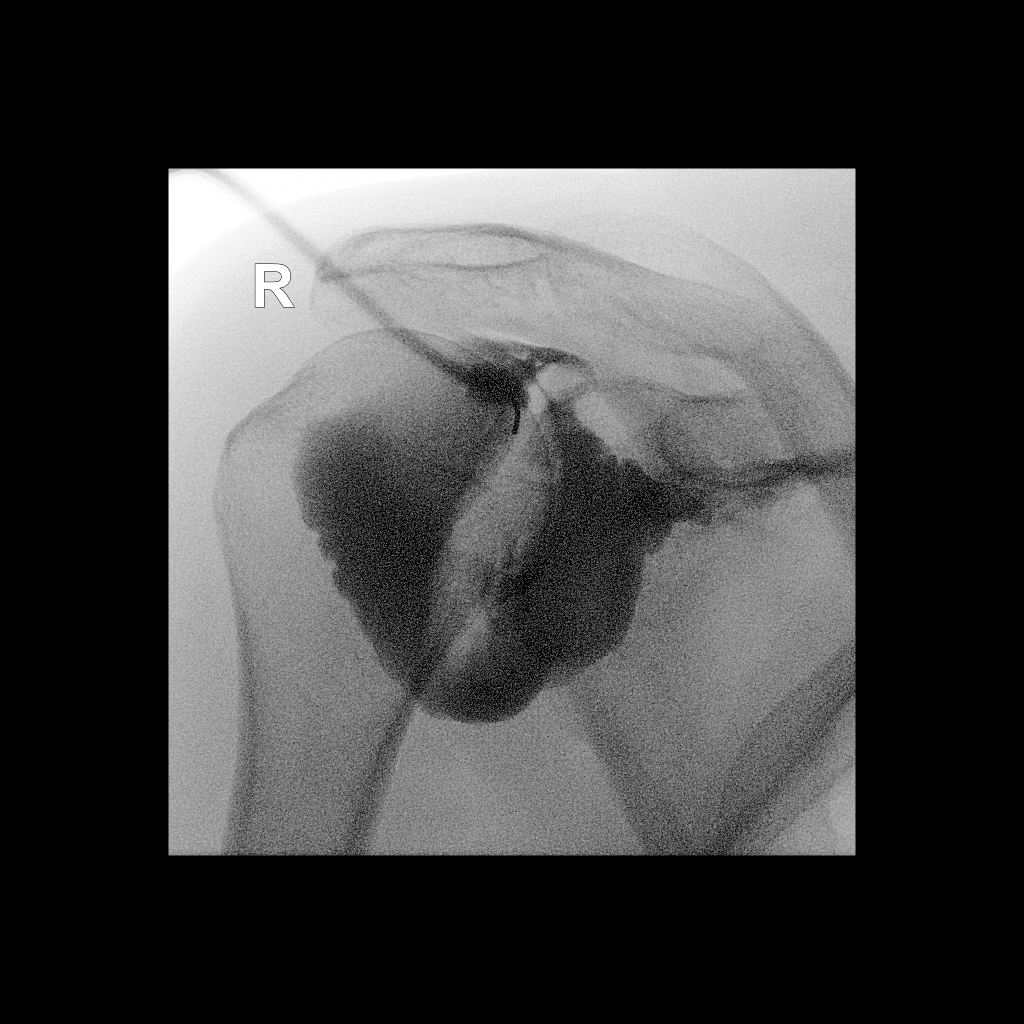

[2 of 2 positions shown; findings below may reference images not displayed]

IMPRESSION: Technically successful right shoulder injection for MRI.

## 2020-02-23 IMAGING — MR MR SHOULDER*R* W/CM
6 series · 40 of 40 positions shown · IV contrast (agent unspecified)
Comparison: Injection images from [DATE]

CLINICAL DATA: Pain in the right shoulder for 1 year

EXAM:
MR ARTHROGRAM OF THE right SHOULDER
TECHNIQUE: Multiplanar, multisequence MR imaging of the right shoulder was
performed following the administration of intra-articular contrast.
CONTRAST:  See Injection Documentation.

[Series 3: T1 fat-sat · axial · 4.0mm · 0.27mm/px · z∈[-21,+60]mm · 8 of 18 slices shown (1 of 4)]
[im 1/18]
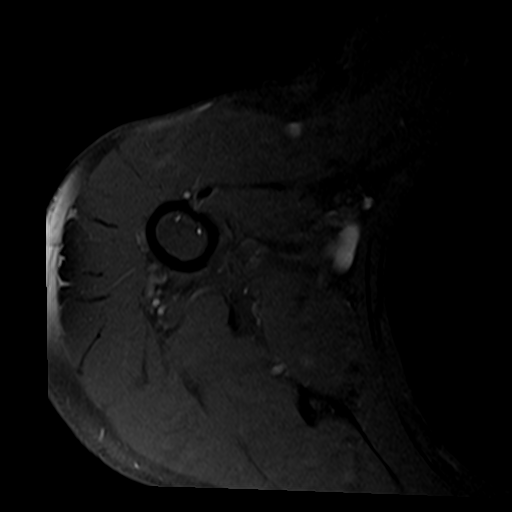
[im 3/18]
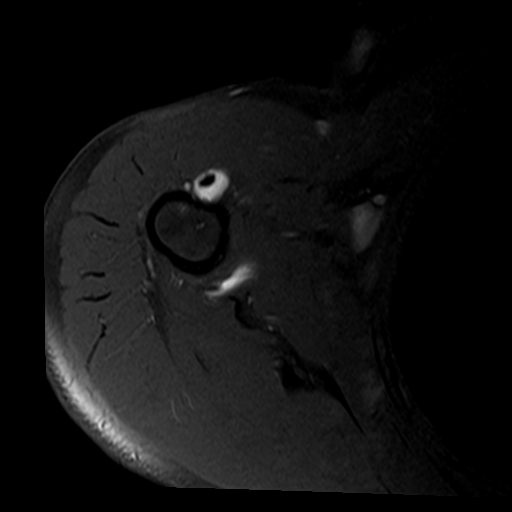
[im 5/18]
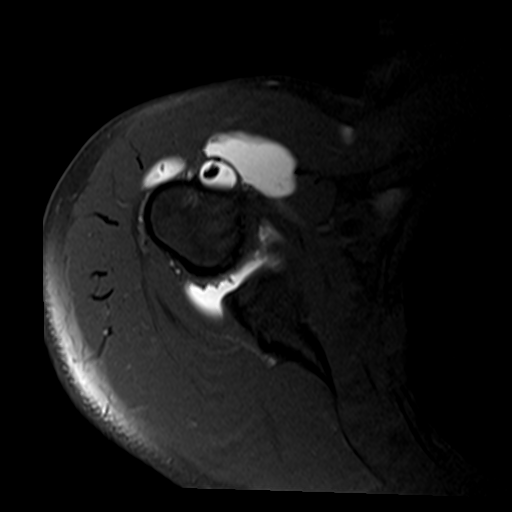
[im 8/18]
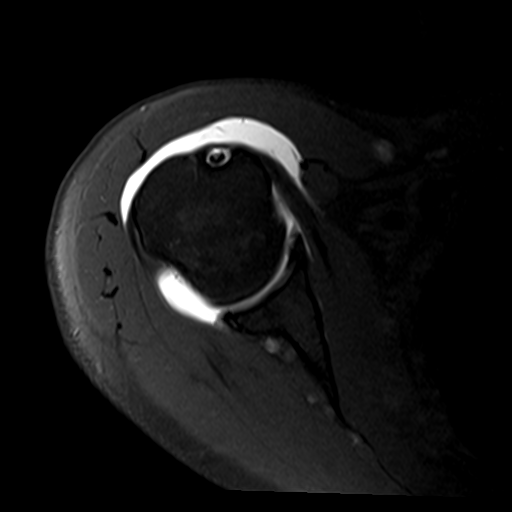
[im 10/18]
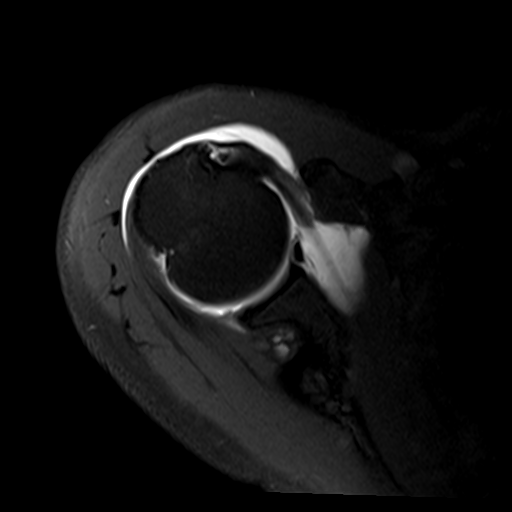
[im 13/18]
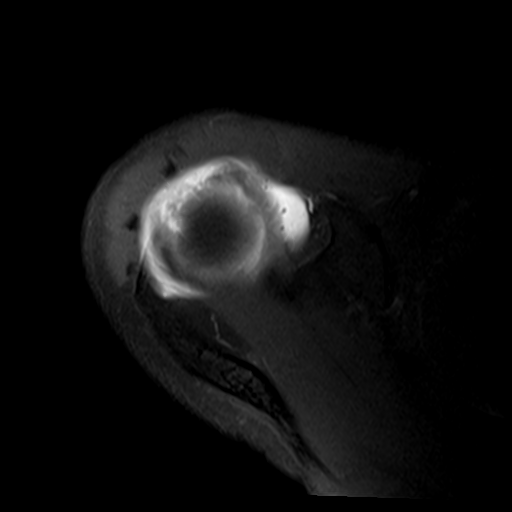
[im 15/18]
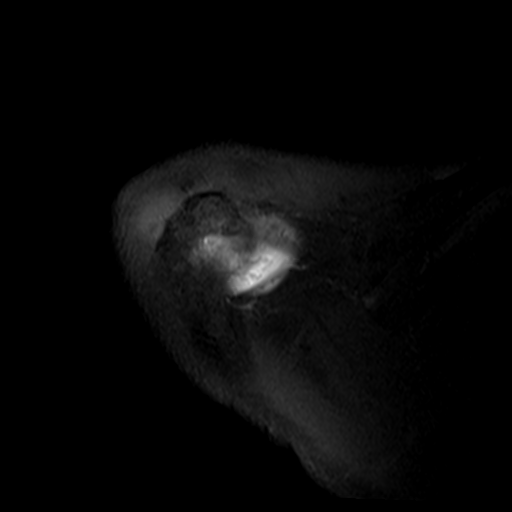
[im 18/18]
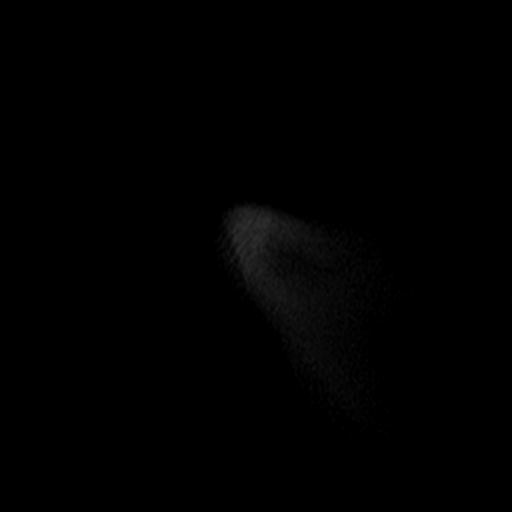

[Series 4: T2 fat-sat · oblique · 4.0mm · 0.55mm/px · 7 of 18 slices shown (1 of 2)]
[im 1/18]
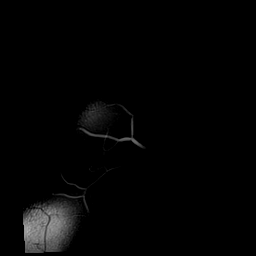
[im 3/18]
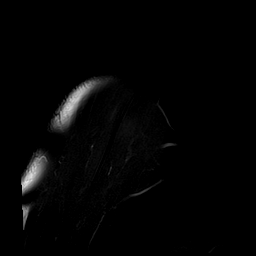
[im 6/18]
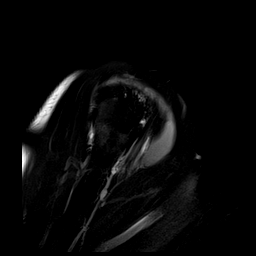
[im 9/18]
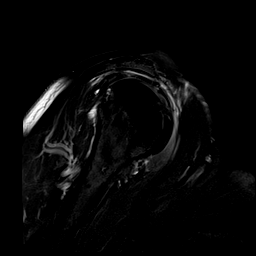
[im 12/18]
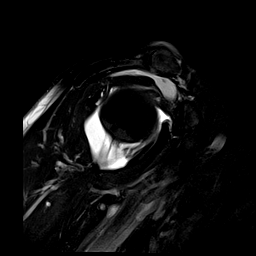
[im 15/18]
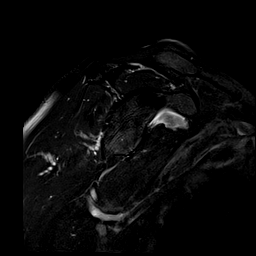
[im 18/18]
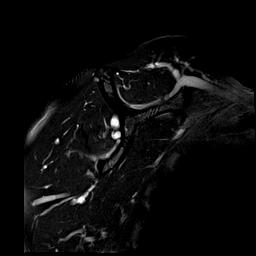

[Series 5: T1 fat-sat · sagittal · 4.0mm · 0.55mm/px · 6 of 16 slices shown (2 of 4)]
[im 1/16]
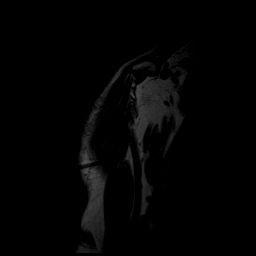
[im 4/16]
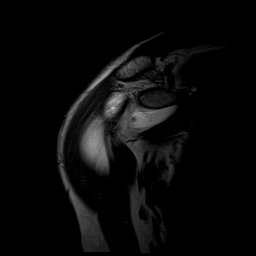
[im 7/16]
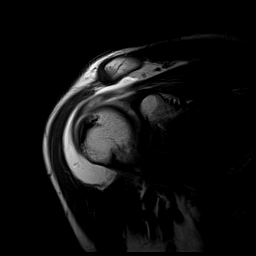
[im 10/16]
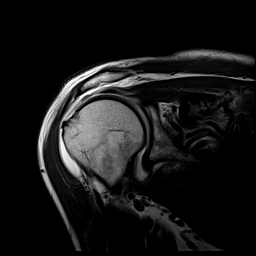
[im 13/16]
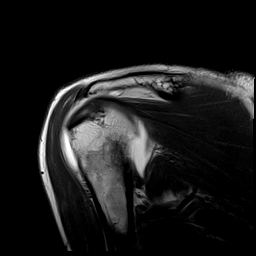
[im 16/16]
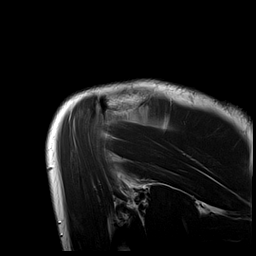

[Series 6: T1 fat-sat · sagittal · 4.0mm · 0.55mm/px · 6 of 16 slices shown (3 of 4)]
[im 1/16]
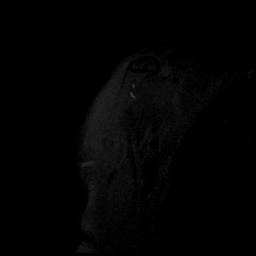
[im 4/16]
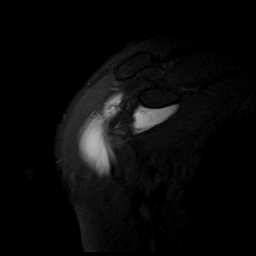
[im 7/16]
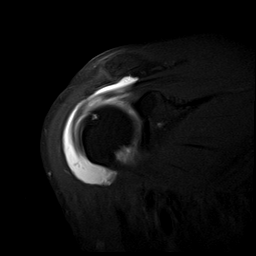
[im 10/16]
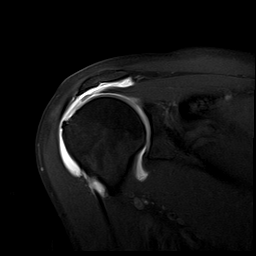
[im 13/16]
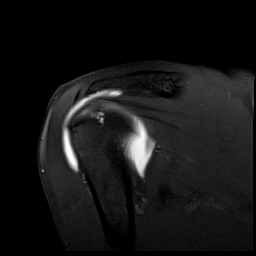
[im 16/16]
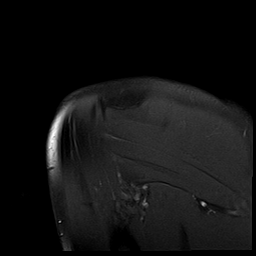

[Series 7: T2 fat-sat · sagittal · 4.0mm · 0.55mm/px · 6 of 16 slices shown (2 of 2)]
[im 1/16]
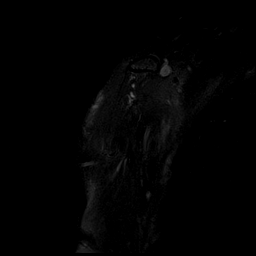
[im 4/16]
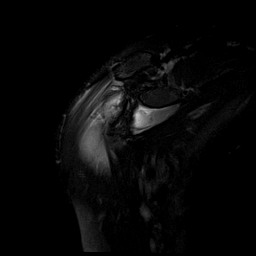
[im 7/16]
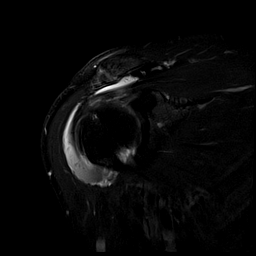
[im 10/16]
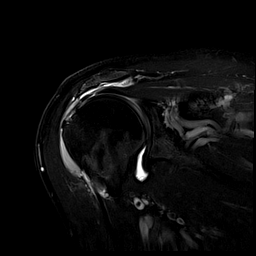
[im 13/16]
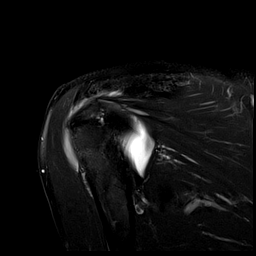
[im 16/16]
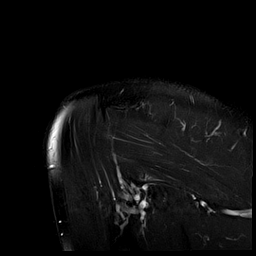

[Series 11: T1 fat-sat · sagittal · 4.0mm · 0.59mm/px · 7 of 18 slices shown (4 of 4)]
[im 1/18]
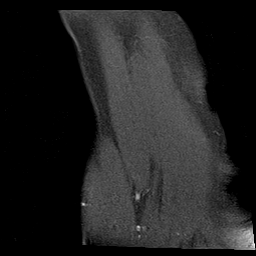
[im 3/18]
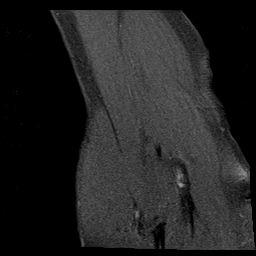
[im 6/18]
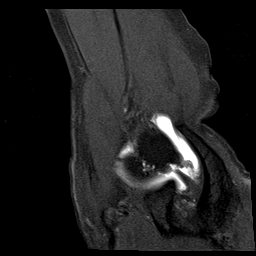
[im 9/18]
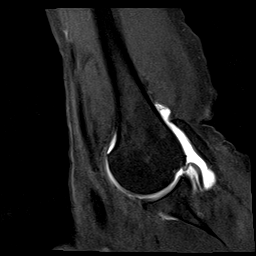
[im 12/18]
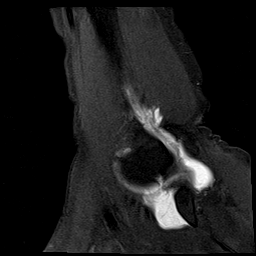
[im 15/18]
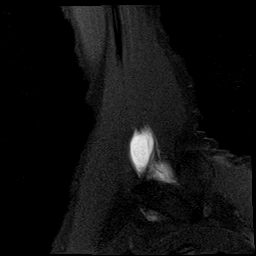
[im 18/18]
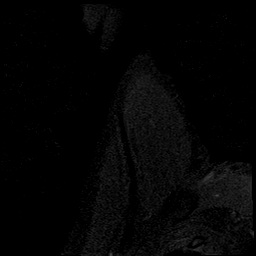

[40 of 40 positions shown; findings below may reference images not displayed]

FINDINGS: Rotator cuff: Marked supraspinatus partial-thickness articular
surface tearing with suspected bursal surface extension in the
critical zone (image [DATE]) or along the distal anterior insertion
(images 7-9 of series 5). Contrast extension into the subacromial
subdeltoid bursa is present.

Intrasubstance fissuring in the infraspinatus muscle as on image 9
of series 4. Prominent supraspinatus and mild infraspinatus
tendinopathy.

Muscles: No significant atrophy or edema.

Biceps long head: Moderate tendinopathy of the intra-articular
segment.

Acromioclavicular Joint: No significant arthropathy. Subacromial
morphology is type 2 (curved).

Glenohumeral Joint: Preserved articular cartilage. The inferior
glenohumeral ligament appears intact. Type 3 anterior capsular
insertion about 1.4 cm from the labrum, which can predispose to
instability.

Labrum: Mild blunting of the posterior labrum but no well-defined
labral tear.

Bones: No significant extra-articular osseous abnormalities
identified.
IMPRESSION: 1. Suspected pinpoint full-thickness partial width supraspinatus
tear if there distally along the anterior insertion, and/or in the
critical zone, based on the severe thinning of the tendon in these
regions, the underlying prominent tendinopathy, and the presence of
considerable contrast medium in the subacromial subdeltoid bursa.
2. Mild infraspinatus tendinopathy with some intrasubstance
fissuring.
3. Moderate tendinopathy of the intra-articular segment of the long
head of the biceps.
4. Type 3 anterior capsular insertion about 1.4 cm from the labrum,
which can predispose to instability.
5. Mild blunting of the posterior labrum but no well-defined labral
tear.

## 2020-02-23 MED ORDER — IOPAMIDOL (ISOVUE-M 200) INJECTION 41%
15.0000 mL | Freq: Once | INTRAMUSCULAR | Status: AC
  Administered 2020-02-23: 15 mL via INTRA_ARTICULAR

## 2020-02-24 ENCOUNTER — Ambulatory Visit: Payer: BC Managed Care – PPO

## 2020-02-24 ENCOUNTER — Telehealth: Payer: Self-pay | Admitting: Family Medicine

## 2020-02-24 NOTE — Telephone Encounter (Signed)
MRI shows suspected partial tear of the supraspinatus tendon of the rotator cuff.

## 2020-02-28 DIAGNOSIS — H2513 Age-related nuclear cataract, bilateral: Secondary | ICD-10-CM | POA: Diagnosis not present

## 2020-02-28 DIAGNOSIS — H52203 Unspecified astigmatism, bilateral: Secondary | ICD-10-CM | POA: Diagnosis not present

## 2020-02-28 DIAGNOSIS — H5203 Hypermetropia, bilateral: Secondary | ICD-10-CM | POA: Diagnosis not present

## 2020-02-28 DIAGNOSIS — H524 Presbyopia: Secondary | ICD-10-CM | POA: Diagnosis not present

## 2020-03-01 DIAGNOSIS — Z20822 Contact with and (suspected) exposure to covid-19: Secondary | ICD-10-CM | POA: Diagnosis not present

## 2020-03-12 DIAGNOSIS — Z1152 Encounter for screening for COVID-19: Secondary | ICD-10-CM | POA: Diagnosis not present

## 2020-03-12 DIAGNOSIS — Z20822 Contact with and (suspected) exposure to covid-19: Secondary | ICD-10-CM | POA: Diagnosis not present

## 2020-03-13 ENCOUNTER — Other Ambulatory Visit: Payer: Self-pay

## 2020-03-13 ENCOUNTER — Ambulatory Visit: Payer: Self-pay

## 2020-03-13 ENCOUNTER — Ambulatory Visit: Payer: BC Managed Care – PPO

## 2020-03-13 ENCOUNTER — Ambulatory Visit: Payer: BC Managed Care – PPO | Admitting: Family Medicine

## 2020-03-13 DIAGNOSIS — G8929 Other chronic pain: Secondary | ICD-10-CM

## 2020-03-13 DIAGNOSIS — M25511 Pain in right shoulder: Secondary | ICD-10-CM

## 2020-03-13 NOTE — Progress Notes (Signed)
Office Visit Note   Patient: Kaitlyn Lopez           Date of Birth: 1961/03/07           MRN: [id number] Visit Date: 03/13/2020 Requested by: Donald Prose, [address] [place],  [place] [address] PCP: Donald Prose, MD  Subjective: Chief Complaint  Patient presents with   Right Shoulder - Pain, Follow-up    Planned prolotherapy injection    HPI: 59yo F presenting to clinic for left shoulder Prolotherapy injection. States she has struggled with chronic shoulder pain, worse in the anterior aspect, for several years. MRI demonstrated Supraspinatus tendonitis, however her pain is primarily located in the biceps. Has had a biceps steroid injection in the past, which helped her pain significantly- though it was very short lived (only 7 days). She would like to try Prolo today, though she will be leaving for [place] very shortly.               ROS:   All other systems were reviewed and are negative.  Objective: Vital Signs: There were no vitals taken for this visit.  Physical Exam:  General:  Alert and oriented, in no acute distress. Pulm:  Breathing unlabored. Psy:  Normal mood, congruent affect. Skin:  Left shoulder with no bruising, rashes, or erythema. Overlying skin intact.  Left Shoulder Exam:  Inspection: Symmetric muscle mass, no atrophy or deformity, no scars. Palpation: Tenderness to palpation directly over biceps. No tenderness to palpation over and around the acromion, over the [facility] joint.  Range of motion: Full range of motion in forward flexion, abduction and extension.    Rotator cuff testing:  Full strength and no pain with empty can (supraspinatus).  External rotation with full strength and no pain. Full strength and no pain with Napoleon and lift off.  AC joint testing: No AC tenderness to palpation. Negative active compression test.  Biceps testing: Positive speeds, Positive Yergason.  Brisk distal capillary  refill.   Imaging/Procedures: Ultrasound Guided Injection- Biceps Prolotherapy:  Risks and benefits of procedure discussed, Patient opted to proceed. Verbal Consent obtained.  Timeout performed.  Area of Maximal tenderness was marked with needlecap, correlating with Biceps tendon.  Skin prepped in a sterile fashion with betadine before further cleansing with alcohol. Ethyl Chloride was used for topical analgesia.  Bicep tendon shealth was injected with 23ml mixture of 20% Dextrose mixed with 0.25% Bupivocaine without epinephrine under US guidance using a 25G, 1.5in needle.  Patient tolerated the injection well with no immediate complications. Aftercare instructions were discussed, and patient was given strict return precautions.    Assessment & Plan: 59yo F presenting to clinic with chronic L shoulder pain. Examination inconsistent with MRI, as most of her symptoms are located over biceps, and MRI had Supraspinatus pathology. Given her symptoms, Prolotherapy was directed towards the biceps today. If she fails to notice improvement with this, would consider Supraspinatus in the future. Patient will be leaving for [place] very shortly, though she has a Orthopedist in the area that she has established with.  - Prolotherapy injection performed as described above, Patient tolerated very well. - Return precautions discussed - Follow up with [place] [facility].     PMFS History: Patient Active Problem List   Diagnosis Date Noted   Squamous cell carcinoma in situ 07/13/2019   Lumbar degenerative disc disease 11/22/2014   Lumbar radicular pain 11/22/2014   Chronic pain syndrome 08/03/2014   Past Medical History:  Diagnosis  Date   Anemia    Anxiety    Contact lens/glasses fitting    wears contacts or glasses   Depression    Hypothyroidism    Thyroid disease     Family History  Problem Relation Age of Onset   Cancer Maternal Grandfather        breast   Breast cancer Paternal  Grandmother     Past Surgical History:  Procedure Laterality Date   BREAST BIOPSY Right 01/08/2013   Procedure: RIGHT BREAST WITH NEEDLE LOCALIZATION;  Surgeon: Joyice Faster. Cornett, MD;  Location: [facility];  Service: General;  Laterality: Right;   BREAST EXCISIONAL BIOPSY     BREAST SURGERY  1992   lumpectomy-lt neg   TRANSFORAMINAL LUMBAR INTERBODY FUSION W/ MIS 4 LEVEL  2011   Social History   Occupational History   Not on file  Tobacco Use   Smoking status: Never Smoker   Smokeless tobacco: Never Used  Substance and Sexual Activity   Alcohol use: Yes   Drug use: No   Sexual activity: Not on file

## 2020-03-13 NOTE — Progress Notes (Signed)
I saw and examined the patient with Dr. Elouise Munroe and agree with assessment and plan as outlined.    Though MRI showed possible supraspinatus partial tear, symptoms continue to localize to biceps tendon.  Injected today with dextrose/bupivocaine.  Follow up with her sports medicine doctor in [place] in 2-3 weeks.

## 2020-03-30 ENCOUNTER — Encounter: Payer: Self-pay | Admitting: Family Medicine

## 2020-07-10 DIAGNOSIS — R5381 Other malaise: Secondary | ICD-10-CM | POA: Diagnosis not present

## 2020-07-10 DIAGNOSIS — E039 Hypothyroidism, unspecified: Secondary | ICD-10-CM | POA: Diagnosis not present

## 2020-07-10 DIAGNOSIS — N951 Menopausal and female climacteric states: Secondary | ICD-10-CM | POA: Diagnosis not present

## 2020-07-17 DIAGNOSIS — N951 Menopausal and female climacteric states: Secondary | ICD-10-CM | POA: Diagnosis not present

## 2020-07-17 DIAGNOSIS — E039 Hypothyroidism, unspecified: Secondary | ICD-10-CM | POA: Diagnosis not present

## 2020-07-17 DIAGNOSIS — D518 Other vitamin B12 deficiency anemias: Secondary | ICD-10-CM | POA: Diagnosis not present

## 2020-07-19 DIAGNOSIS — D485 Neoplasm of uncertain behavior of skin: Secondary | ICD-10-CM | POA: Diagnosis not present

## 2020-07-19 DIAGNOSIS — C4402 Squamous cell carcinoma of skin of lip: Secondary | ICD-10-CM | POA: Diagnosis not present

## 2020-07-19 DIAGNOSIS — L820 Inflamed seborrheic keratosis: Secondary | ICD-10-CM | POA: Diagnosis not present

## 2020-07-19 DIAGNOSIS — T148XXA Other injury of unspecified body region, initial encounter: Secondary | ICD-10-CM | POA: Diagnosis not present

## 2020-07-24 ENCOUNTER — Encounter: Payer: Self-pay | Admitting: Family Medicine

## 2020-07-25 ENCOUNTER — Other Ambulatory Visit: Payer: Self-pay

## 2020-07-25 ENCOUNTER — Ambulatory Visit: Payer: Self-pay

## 2020-07-25 ENCOUNTER — Ambulatory Visit: Payer: BC Managed Care – PPO | Admitting: Family Medicine

## 2020-07-25 DIAGNOSIS — M25511 Pain in right shoulder: Secondary | ICD-10-CM

## 2020-07-25 DIAGNOSIS — G8929 Other chronic pain: Secondary | ICD-10-CM

## 2020-07-25 NOTE — Progress Notes (Signed)
Office Visit Note   Patient: Kaitlyn Lopez           Date of Birth: 08-Dec-1960           MRN: [id number] Visit Date: 07/25/2020 Requested by: Donald Prose, MD [address] [place],  [place] [address] PCP: Donald Prose, MD  Subjective: Chief Complaint  Patient presents with  . Right Shoulder - Pain    PRP injection    HPI: She is here with persistent right shoulder pain.  She has a partial tear of the articular fibers of the supraspinatus.  She has been in [place] for the past few months.  She did not receive any treatment there.  Her shoulder still bothers her quite a bit.  She is interested in trying PRP.  She also asked me about her left knee.  She has a history of arthroscopic debridement of the meniscus tear 8 or 10 years ago.  She was also told there was an articular cartilage defect.  She has been having symptoms when doing lunges, more of an instability feeling than that pain.              ROS:   All other systems were reviewed and are negative.  Objective: Vital Signs: There were no vitals taken for this visit.  Physical Exam:  General:  Alert and oriented, in no acute distress. Pulm:  Breathing unlabored. Psy:  Normal mood, congruent affect.  Right shoulder: Full active range of motion, minimal pain with rotator cuff strength testing.  She is tender to palpation in the lateral subacromial space.  No tenderness over the biceps tendon today. Left knee: 1+ patellofemoral crepitus, mild pain with patella compression.  Ligaments feel stable, mild tenderness on the medial joint line with no palpable click on McMurray's.   Imaging: US Guided Needle Placement - No Linked Charges  Result Date: 07/25/2020 Right shoulder ultrasound-guided PRP injection: After sterile prep with Betadine, injected 5 cc 1% lidocaine without epinephrine, then using a 22-gauge 1/2 inch needle, injected 6 cc of leukocyte rich PRP.  The articular surface partial tear was readily  visualized with infiltration of the PRP.  Fenestration technique was used.  She tolerated this without any immediate side effects.   Assessment & Plan: 1.  Chronic right shoulder pain with partial tear of supraspinatus -Proceed with PRP today.  Could repeat in 1 month if needed.  2.  Left knee pain, suspect patellofemoral articular cartilage injury versus medial meniscus tear -Modify activities to avoid loading weight on the knee when it is bent more than about 60 degrees.     Procedures: No procedures performed        PMFS History: Patient Active Problem List   Diagnosis Date Noted  . Squamous cell carcinoma in situ 07/13/2019  . Lumbar degenerative disc disease 11/22/2014  . Lumbar radicular pain 11/22/2014  . Chronic pain syndrome 08/03/2014   Past Medical History:  Diagnosis Date  . Anemia   . Anxiety   . Contact lens/glasses fitting    wears contacts or glasses  . Depression   . Hypothyroidism   . Thyroid disease     Family History  Problem Relation Age of Onset  . Cancer Maternal Grandfather        breast  . Breast cancer Paternal Grandmother     Past Surgical History:  Procedure Laterality Date  . BREAST BIOPSY Right 01/08/2013   Procedure: RIGHT BREAST WITH NEEDLE LOCALIZATION;  Surgeon: Marcello Moores A.  Cornett, MD;  Location: [facility];  Service: General;  Laterality: Right;  . BREAST EXCISIONAL BIOPSY    . BREAST SURGERY  1992   lumpectomy-lt neg  . TRANSFORAMINAL LUMBAR INTERBODY FUSION W/ MIS 4 LEVEL  2011   Social History   Occupational History  . Not on file  Tobacco Use  . Smoking status: Never Smoker  . Smokeless tobacco: Never Used  Substance and Sexual Activity  . Alcohol use: Yes  . Drug use: No  . Sexual activity: Not on file

## 2020-07-27 DIAGNOSIS — M1712 Unilateral primary osteoarthritis, left knee: Secondary | ICD-10-CM | POA: Diagnosis not present

## 2020-07-31 MED ORDER — BACLOFEN 10 MG PO TABS
5.0000 mg | ORAL_TABLET | Freq: Three times a day (TID) | ORAL | 3 refills | Status: AC | PRN
Start: 2020-07-31 — End: ?

## 2020-07-31 NOTE — Addendum Note (Signed)
Addended by: Hortencia Pilar on: 07/31/2020 11:33 AM   Modules accepted: Orders

## 2020-08-16 ENCOUNTER — Ambulatory Visit
Admission: RE | Admit: 2020-08-16 | Discharge: 2020-08-16 | Disposition: A | Discharge status: Home / Self Care | Payer: BC Managed Care – PPO | Source: Ambulatory Visit | Attending: Family Medicine | Admitting: Family Medicine

## 2020-08-16 ENCOUNTER — Other Ambulatory Visit: Payer: Self-pay

## 2020-08-16 DIAGNOSIS — Z1231 Encounter for screening mammogram for malignant neoplasm of breast: Secondary | ICD-10-CM

## 2020-08-16 IMAGING — MG MM DIGITAL SCREENING BILAT W/ TOMO AND CAD
8 series · 9 of 24 positions shown · non-contrast
Comparison: Previous exam(s).

CLINICAL DATA: Screening.

EXAM:
DIGITAL SCREENING BILATERAL MAMMOGRAM WITH TOMOSYNTHESIS AND CAD
TECHNIQUE: Bilateral screening digital craniocaudal and mediolateral oblique
mammograms were obtained. Bilateral screening digital breast
tomosynthesis was performed. The images were evaluated with
computer-aided detection.

[L MLO synth-2D]
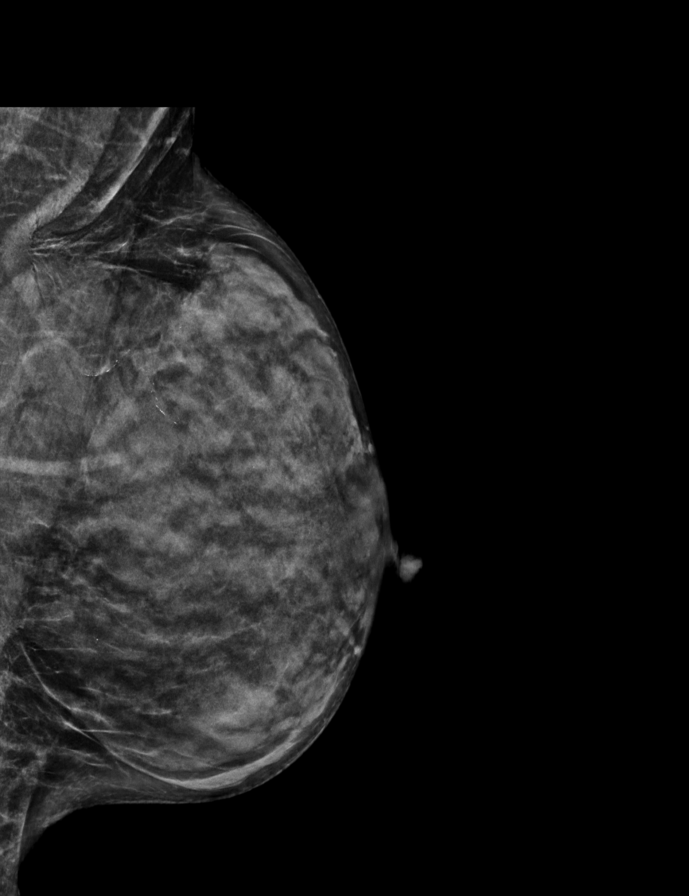

[R CC synth-2D]
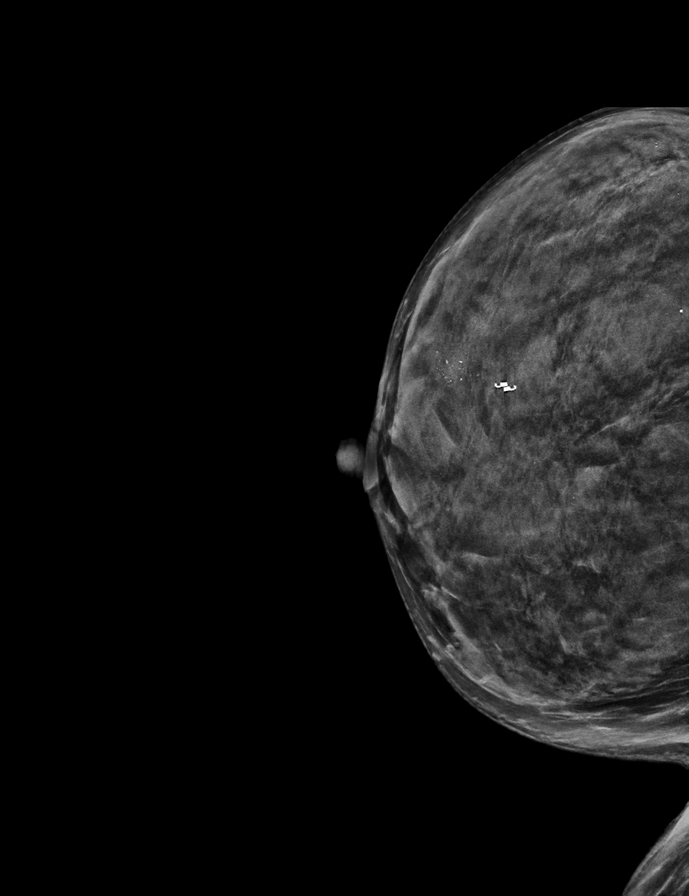

[L CC synth-2D]
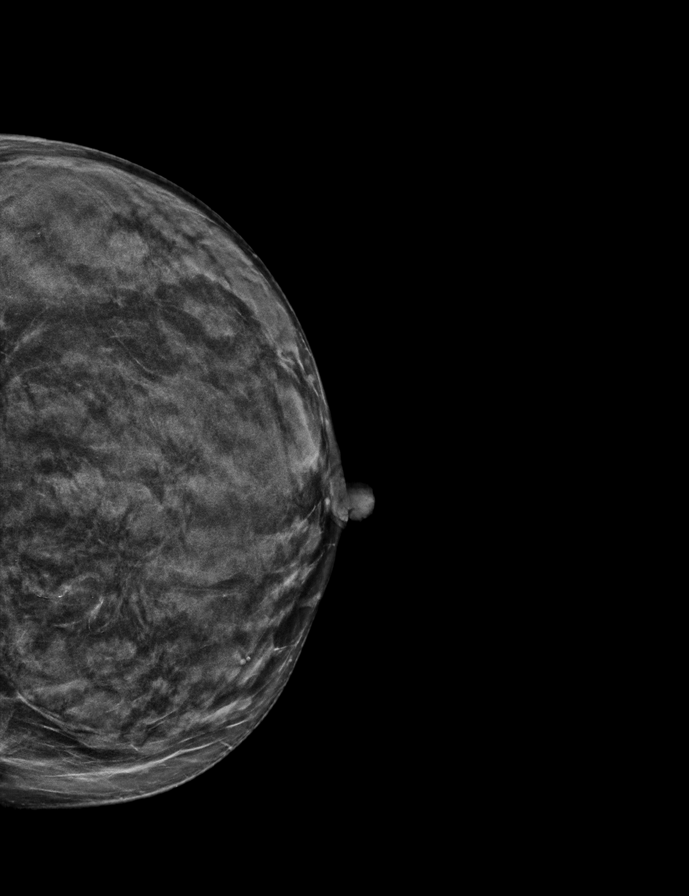

[R MLO synth-2D]
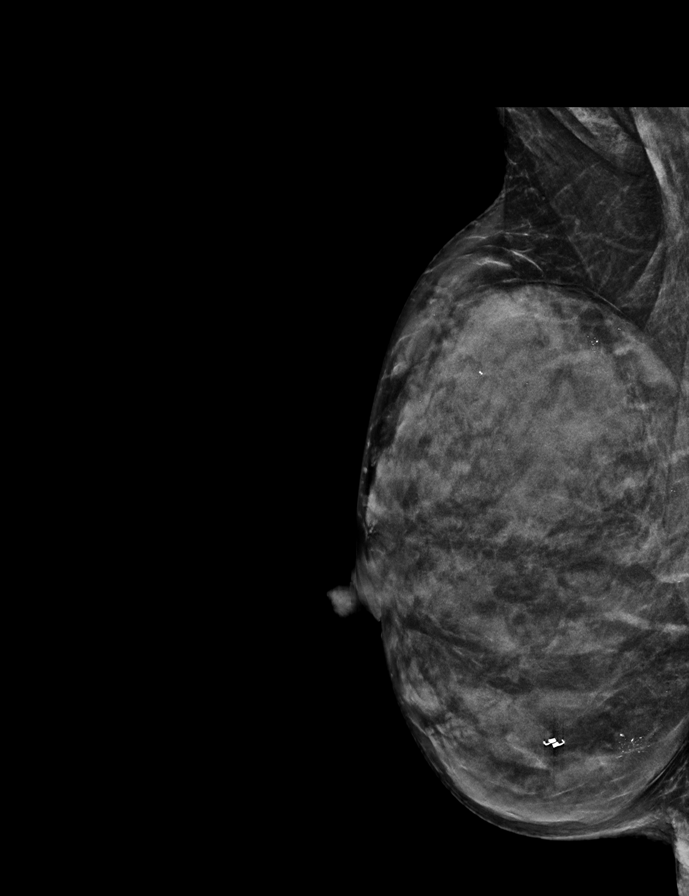

[R CC tomo · 2 of 50 frames shown]
[frame 17/50]
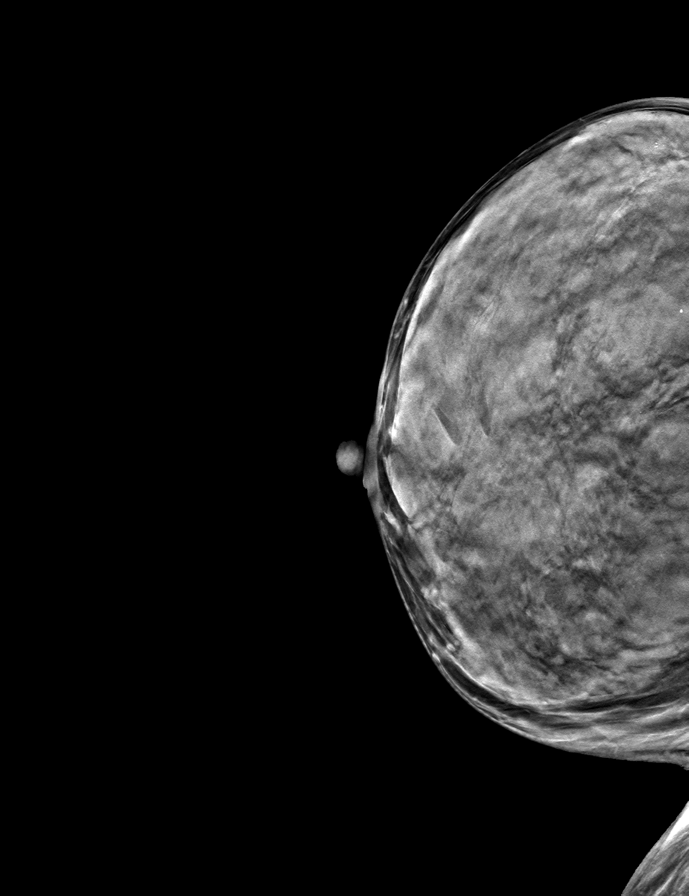
[frame 25/50]
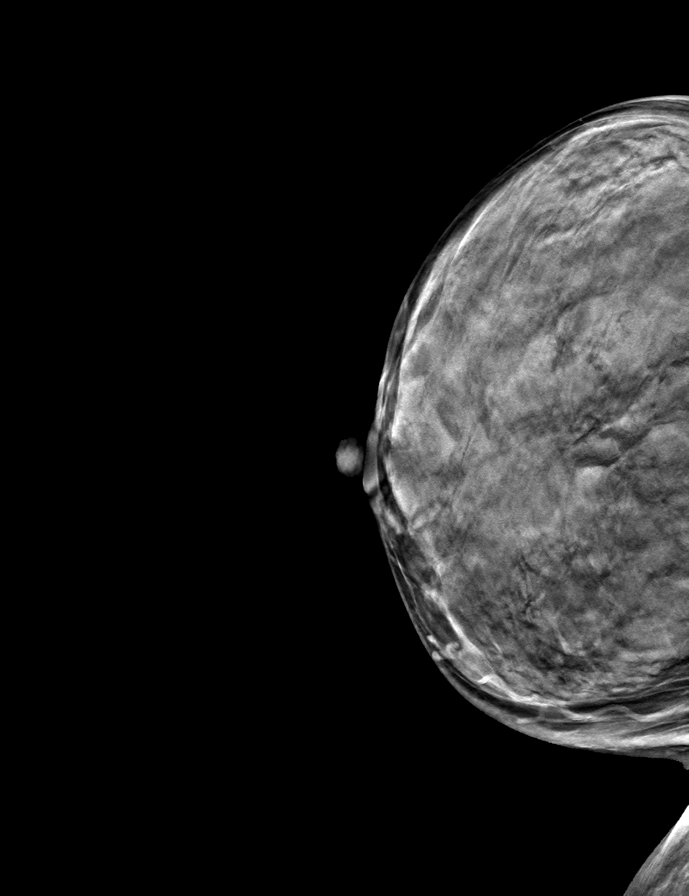

[R MLO tomo · tomo slice 31/60.0]
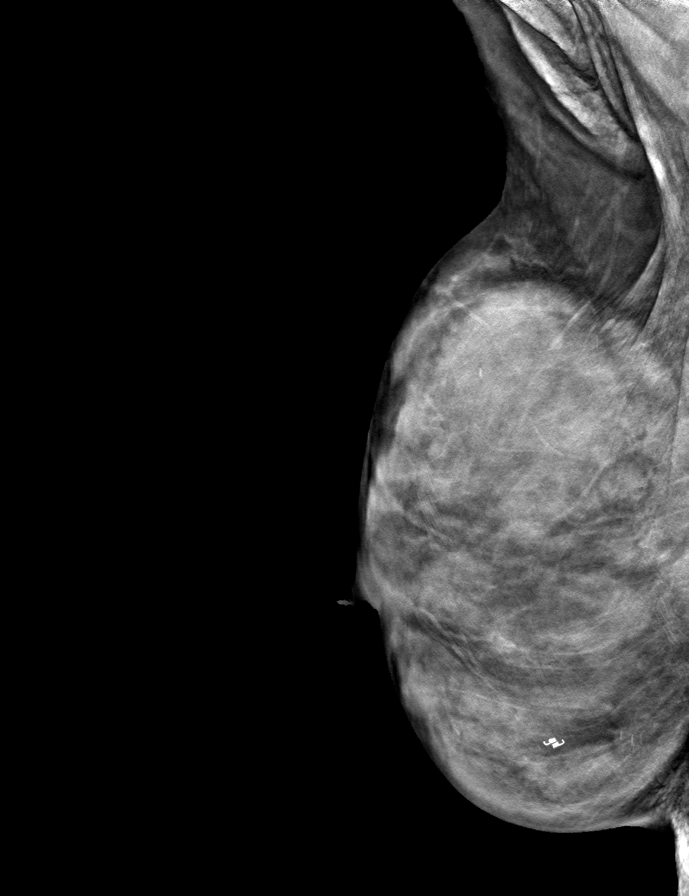

[L MLO tomo · tomo slice 31/60.0]
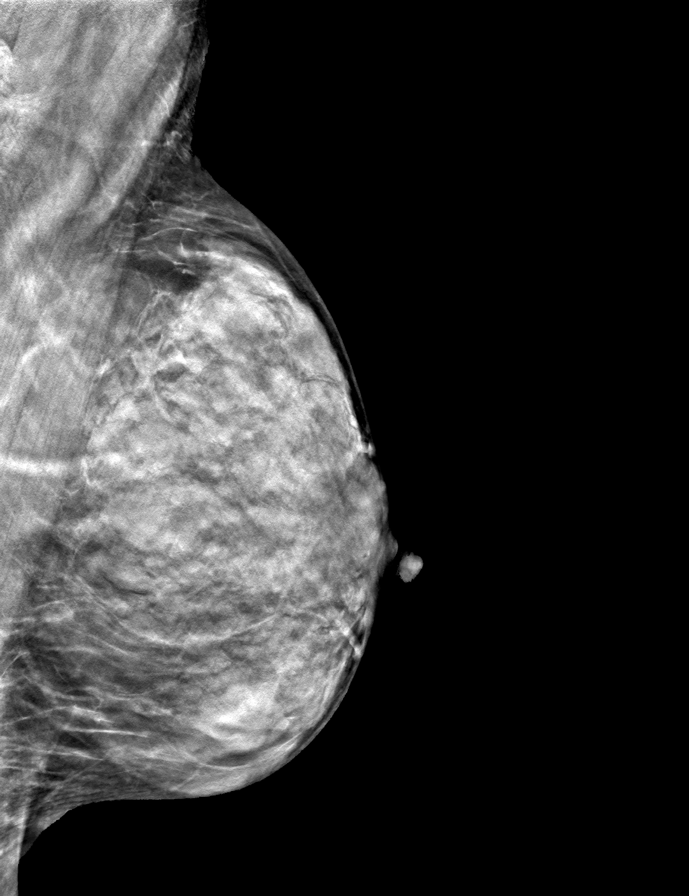

[L CC tomo · tomo slice 27/53.0]
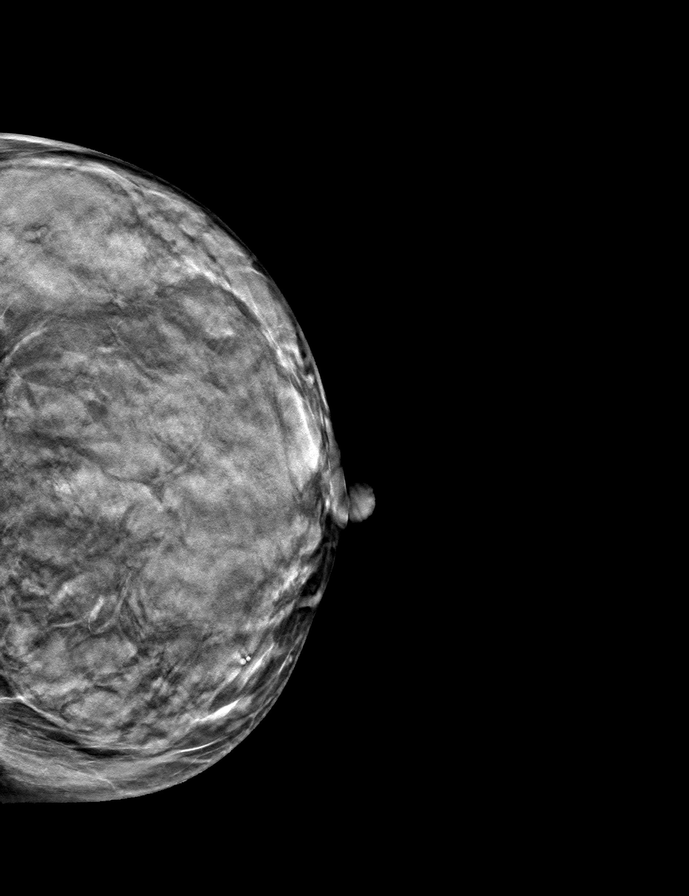

[9 of 24 positions shown; findings below may reference images not displayed]

ACR Breast Density Category d: The breast tissue is extremely dense,
which lowers the sensitivity of mammography
FINDINGS: There are no findings suspicious for malignancy. The images were
evaluated with computer-aided detection.
IMPRESSION: No mammographic evidence of malignancy. A result letter of this
screening mammogram will be mailed directly to the patient.

RECOMMENDATION:
Screening mammogram in one year. (Code:[1T])

BI-RADS CATEGORY  1: Negative.

## 2020-08-29 ENCOUNTER — Encounter: Payer: Self-pay | Admitting: Family Medicine

## 2020-10-17 DIAGNOSIS — D040 Carcinoma in situ of skin of lip: Secondary | ICD-10-CM | POA: Diagnosis not present

## 2020-10-27 ENCOUNTER — Institutional Professional Consult (permissible substitution): Payer: BC Managed Care – PPO | Admitting: Plastic Surgery

## 2020-11-23 DIAGNOSIS — R102 Pelvic and perineal pain: Secondary | ICD-10-CM | POA: Diagnosis not present

## 2020-11-23 DIAGNOSIS — R3915 Urgency of urination: Secondary | ICD-10-CM | POA: Diagnosis not present

## 2020-12-07 DIAGNOSIS — M545 Low back pain, unspecified: Secondary | ICD-10-CM | POA: Diagnosis not present

## 2020-12-22 ENCOUNTER — Other Ambulatory Visit: Payer: Self-pay | Admitting: Family Medicine

## 2020-12-22 DIAGNOSIS — M545 Low back pain, unspecified: Secondary | ICD-10-CM

## 2020-12-22 DIAGNOSIS — M549 Dorsalgia, unspecified: Secondary | ICD-10-CM | POA: Diagnosis not present

## 2020-12-25 ENCOUNTER — Other Ambulatory Visit: Payer: Self-pay | Admitting: Family Medicine

## 2020-12-25 DIAGNOSIS — M545 Low back pain, unspecified: Secondary | ICD-10-CM

## 2020-12-28 DIAGNOSIS — Z79899 Other long term (current) drug therapy: Secondary | ICD-10-CM | POA: Diagnosis not present

## 2020-12-28 DIAGNOSIS — M5417 Radiculopathy, lumbosacral region: Secondary | ICD-10-CM | POA: Diagnosis not present

## 2020-12-28 DIAGNOSIS — Z4542 Encounter for adjustment and management of neuropacemaker (brain) (peripheral nerve) (spinal cord): Secondary | ICD-10-CM | POA: Diagnosis not present

## 2020-12-28 DIAGNOSIS — G894 Chronic pain syndrome: Secondary | ICD-10-CM | POA: Diagnosis not present

## 2020-12-28 DIAGNOSIS — Z5181 Encounter for therapeutic drug level monitoring: Secondary | ICD-10-CM | POA: Diagnosis not present

## 2020-12-28 DIAGNOSIS — Z9689 Presence of other specified functional implants: Secondary | ICD-10-CM | POA: Diagnosis not present

## 2021-01-08 ENCOUNTER — Other Ambulatory Visit: Payer: Self-pay | Admitting: Family Medicine

## 2021-01-08 DIAGNOSIS — M545 Low back pain, unspecified: Secondary | ICD-10-CM

## 2021-01-16 DIAGNOSIS — S39013D Strain of muscle, fascia and tendon of pelvis, subsequent encounter: Secondary | ICD-10-CM | POA: Diagnosis not present

## 2021-01-16 DIAGNOSIS — N942 Vaginismus: Secondary | ICD-10-CM | POA: Diagnosis not present

## 2021-01-30 DIAGNOSIS — C44511 Basal cell carcinoma of skin of breast: Secondary | ICD-10-CM | POA: Diagnosis not present

## 2021-01-30 DIAGNOSIS — L820 Inflamed seborrheic keratosis: Secondary | ICD-10-CM | POA: Diagnosis not present

## 2021-01-30 DIAGNOSIS — L570 Actinic keratosis: Secondary | ICD-10-CM | POA: Diagnosis not present

## 2021-01-30 DIAGNOSIS — C44712 Basal cell carcinoma of skin of right lower limb, including hip: Secondary | ICD-10-CM | POA: Diagnosis not present

## 2021-01-30 DIAGNOSIS — L578 Other skin changes due to chronic exposure to nonionizing radiation: Secondary | ICD-10-CM | POA: Diagnosis not present

## 2021-01-30 DIAGNOSIS — D225 Melanocytic nevi of trunk: Secondary | ICD-10-CM | POA: Diagnosis not present

## 2021-01-30 DIAGNOSIS — B078 Other viral warts: Secondary | ICD-10-CM | POA: Diagnosis not present

## 2021-01-30 DIAGNOSIS — L814 Other melanin hyperpigmentation: Secondary | ICD-10-CM | POA: Diagnosis not present

## 2021-01-30 DIAGNOSIS — C44719 Basal cell carcinoma of skin of left lower limb, including hip: Secondary | ICD-10-CM | POA: Diagnosis not present

## 2021-01-30 DIAGNOSIS — D485 Neoplasm of uncertain behavior of skin: Secondary | ICD-10-CM | POA: Diagnosis not present

## 2021-02-07 DIAGNOSIS — T85193A Other mechanical complication of implanted electronic neurostimulator, generator, initial encounter: Secondary | ICD-10-CM | POA: Diagnosis not present

## 2021-02-07 DIAGNOSIS — Z4542 Encounter for adjustment and management of neuropacemaker (brain) (peripheral nerve) (spinal cord): Secondary | ICD-10-CM | POA: Diagnosis not present

## 2021-02-07 DIAGNOSIS — G894 Chronic pain syndrome: Secondary | ICD-10-CM | POA: Diagnosis not present

## 2021-02-22 DIAGNOSIS — M47816 Spondylosis without myelopathy or radiculopathy, lumbar region: Secondary | ICD-10-CM | POA: Diagnosis not present

## 2021-02-26 ENCOUNTER — Other Ambulatory Visit: Payer: Self-pay | Admitting: Family Medicine

## 2021-02-26 DIAGNOSIS — M545 Low back pain, unspecified: Secondary | ICD-10-CM

## 2021-02-26 DIAGNOSIS — G8929 Other chronic pain: Secondary | ICD-10-CM

## 2021-03-06 ENCOUNTER — Other Ambulatory Visit: Payer: BC Managed Care – PPO

## 2021-03-06 DIAGNOSIS — H2513 Age-related nuclear cataract, bilateral: Secondary | ICD-10-CM | POA: Diagnosis not present

## 2021-03-11 ENCOUNTER — Ambulatory Visit
Admission: RE | Admit: 2021-03-11 | Discharge: 2021-03-11 | Disposition: A | Discharge status: Home / Self Care | Payer: BC Managed Care – PPO | Source: Ambulatory Visit | Attending: Family Medicine | Admitting: Family Medicine

## 2021-03-11 DIAGNOSIS — G8929 Other chronic pain: Secondary | ICD-10-CM

## 2021-03-11 DIAGNOSIS — M545 Low back pain, unspecified: Secondary | ICD-10-CM | POA: Diagnosis not present

## 2021-03-11 IMAGING — MR MR LUMBAR SPINE W/O CM
4 of 5 series · 26 of 48 positions shown · non-contrast
Comparison: [DATE]

CLINICAL DATA: Lower back pain, worse on the left

EXAM:
MRI LUMBAR SPINE WITHOUT CONTRAST
TECHNIQUE: Multiplanar, multisequence MR imaging of the lumbar spine was
performed. No intravenous contrast was administered.

[Series 3: T2 · sagittal · 4.0mm · 0.53mm/px · 6 of 15 slices shown (1 of 2)]
[im 1/15]
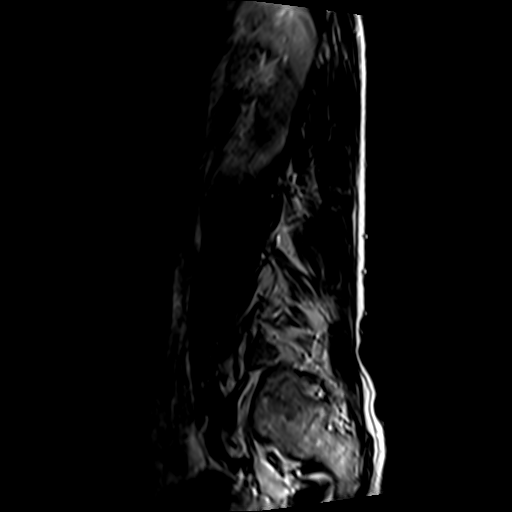
[im 3/15]
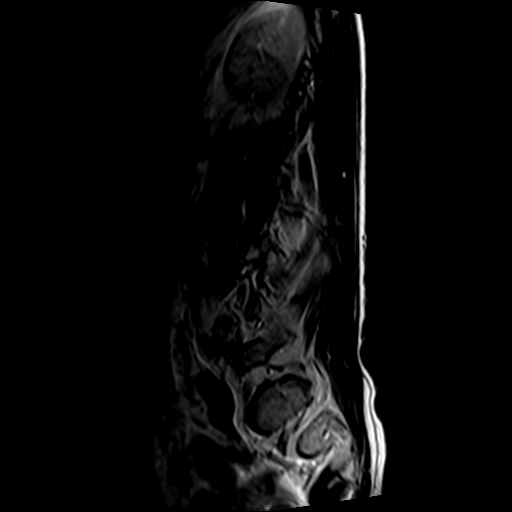
[im 6/15]
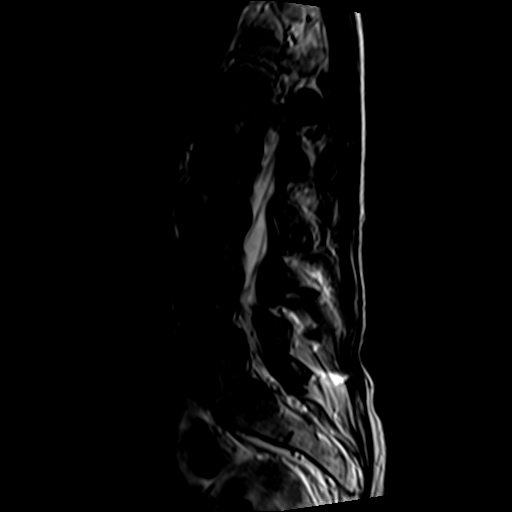
[im 9/15]
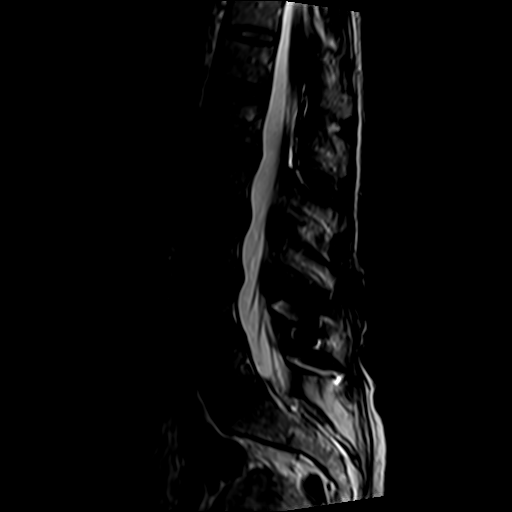
[im 12/15]
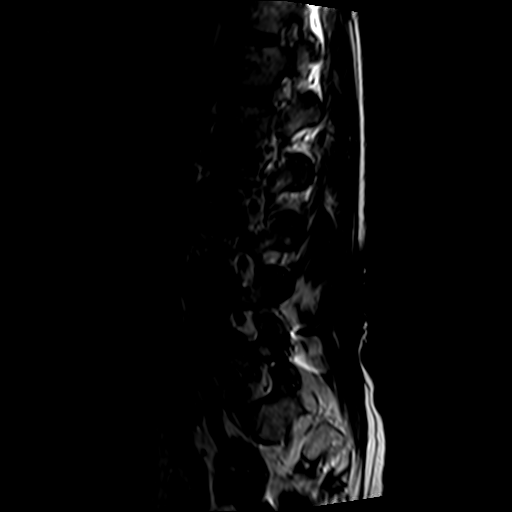
[im 15/15]
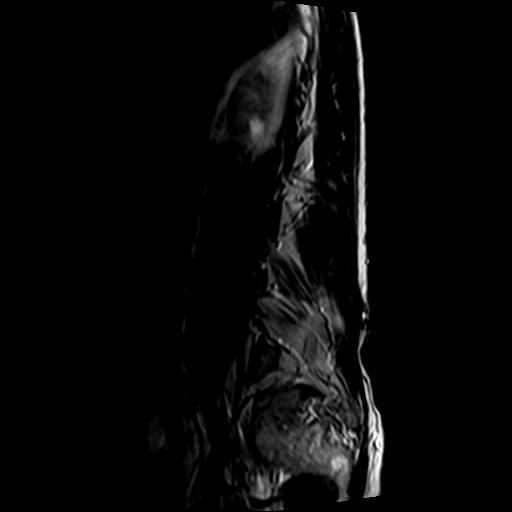

[Series 5: T1 · sagittal · 4.0mm · 0.53mm/px · 6 of 15 slices shown (1 of 2)]
[im 1/15]
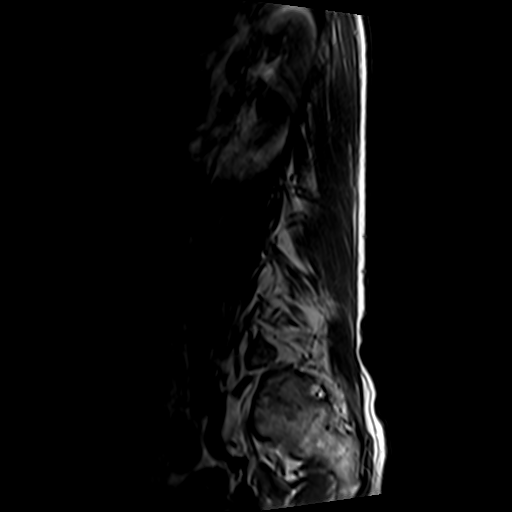
[im 3/15]
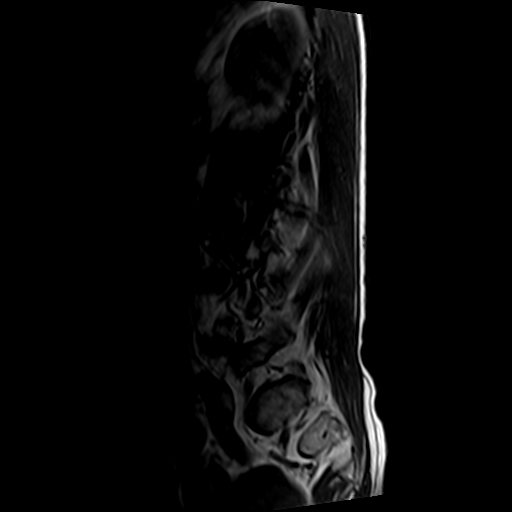
[im 6/15]
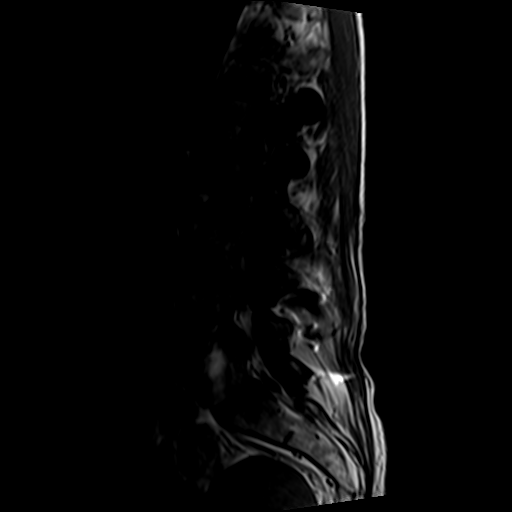
[im 9/15]
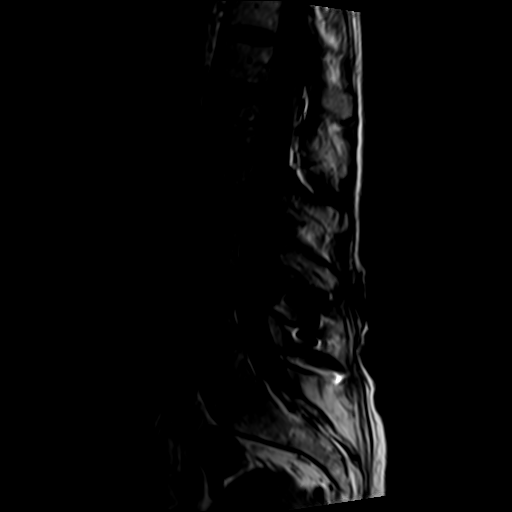
[im 12/15]
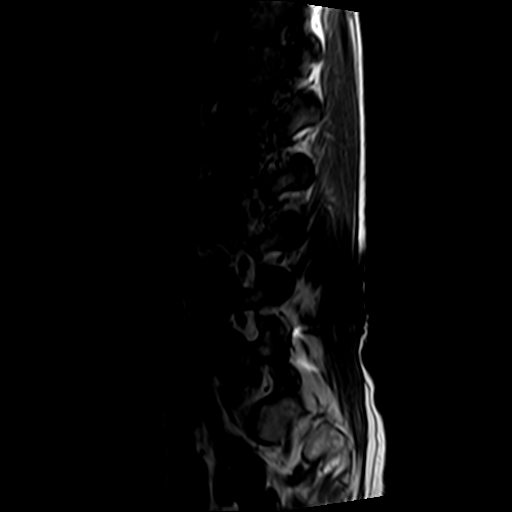
[im 15/15]
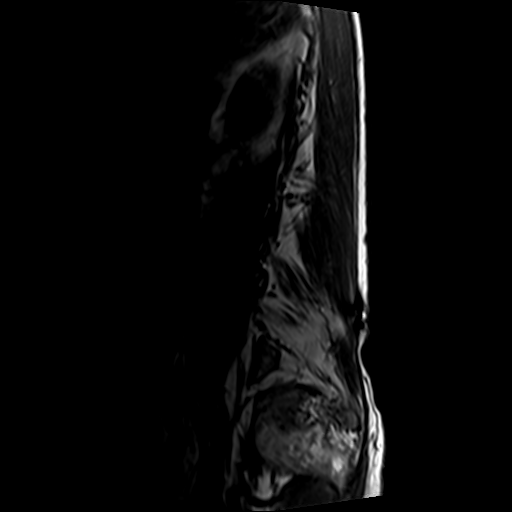

[Series 6: T2 · axial · 4.0mm · 0.70mm/px · z∈[-75,+136]mm · 9 of 37 slices shown (2 of 2)]
[im 1/37]
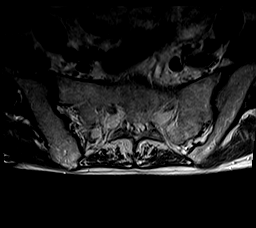
[im 6/37]
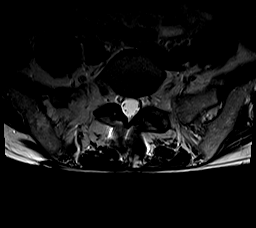
[im 11/37]
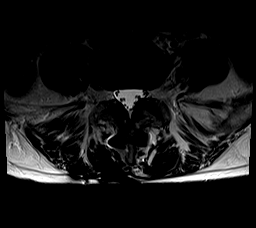
[im 16/37]
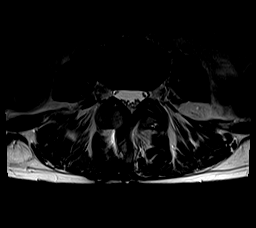
[im 19/37]
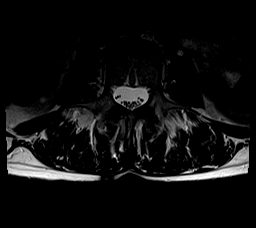
[im 21/37]
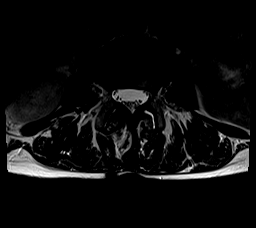
[im 26/37]
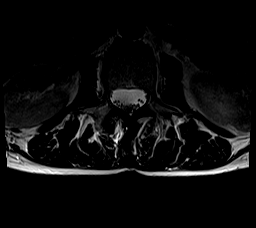
[im 31/37]
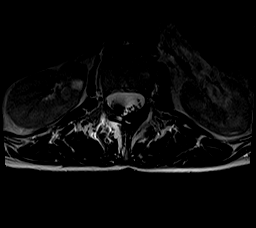
[im 37/37]
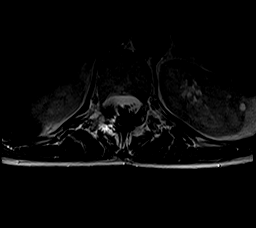

[Series 7: T1 · axial · 4.0mm · 0.35mm/px · z∈[-75,+105]mm · 5 of 37 slices shown (2 of 2)]
[im 1/37]
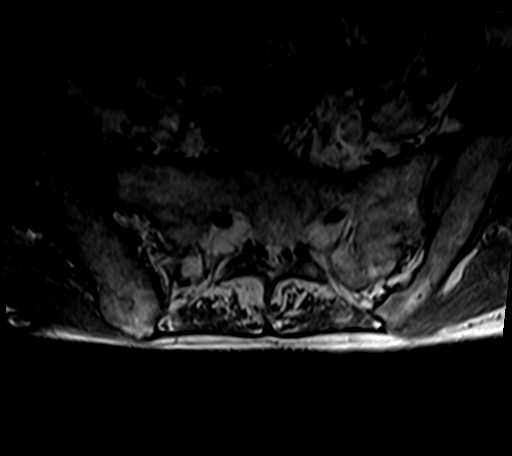
[im 6/37]
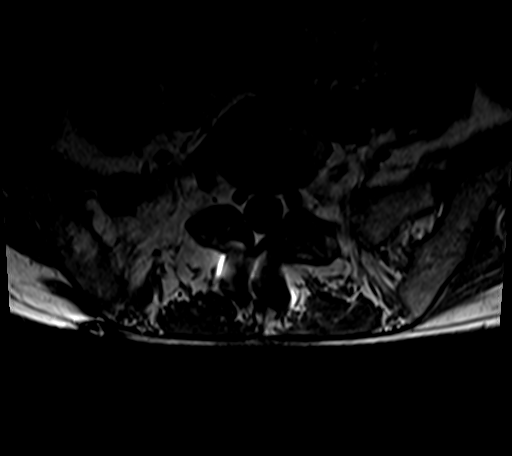
[im 11/37]
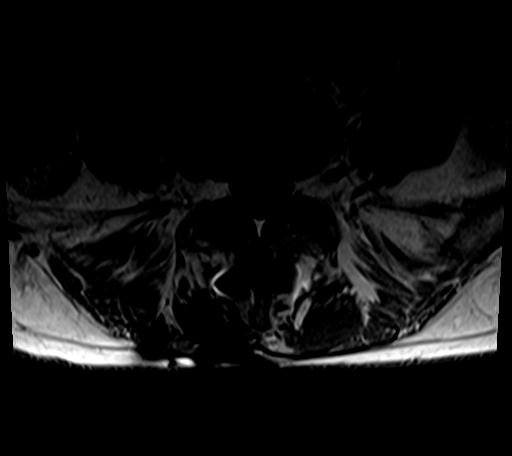
[im 19/37]
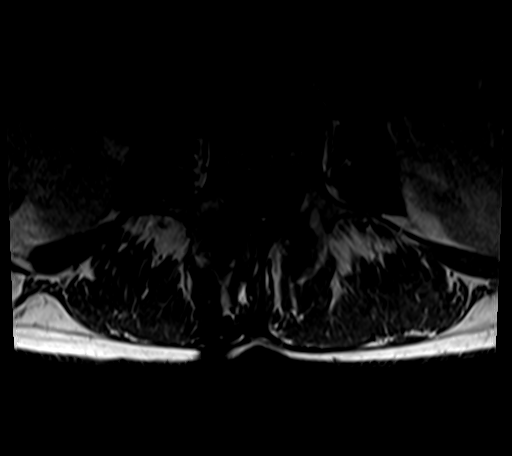
[im 31/37]
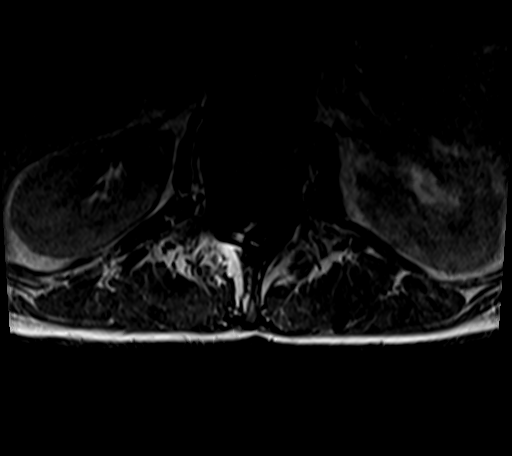

[26 of 48 positions shown; findings below may reference images not displayed]

FINDINGS: Segmentation:  Standard.

Alignment:  Minimal levocurvature.  No listhesis.

Vertebrae: Status post L4-L5 fusion. No acute fracture or suspicious
osseous lesion.

Conus medullaris and cauda equina: Conus extends to the L1 level.
Conus and cauda equina appear normal.

Paraspinal and other soft tissues: Leads from a spinal cord
stimulator are seen in the soft tissues of the back, entering the
spinal cord at approximately L1-L2, with associated mild
susceptibility artifact. Small renal cysts.

Disc levels:

T12-L1: No significant disc bulge. Mild facet arthropathy. No spinal
canal stenosis or neural foraminal narrowing.

L1-L2: Minimal disc bulge. Mild facet arthropathy. No spinal canal
stenosis or neural foraminal narrowing.

L2-L3: Minimal disc bulge. Moderate facet arthropathy. No spinal
canal stenosis or neural foraminal narrowing.

L3-L4: Mild disc bulge with superimposed central protrusion with
annular fissure. Moderate facet arthropathy. No spinal canal
stenosis or neural foraminal narrowing.

L4-L5: Status post fusion. Moderate facet arthropathy. No spinal
canal stenosis or neural foraminal narrowing.

L5-S1: No significant disc bulge. Moderate facet arthropathy. No
spinal canal stenosis or neural foraminal narrowing.
IMPRESSION: 1. No spinal canal stenosis or neural foraminal narrowing.
2. Multilevel facet arthropathy, which can cause pain.

## 2021-03-12 DIAGNOSIS — Z9689 Presence of other specified functional implants: Secondary | ICD-10-CM | POA: Diagnosis not present

## 2021-03-12 DIAGNOSIS — G894 Chronic pain syndrome: Secondary | ICD-10-CM | POA: Diagnosis not present

## 2021-03-12 DIAGNOSIS — M47816 Spondylosis without myelopathy or radiculopathy, lumbar region: Secondary | ICD-10-CM | POA: Diagnosis not present

## 2021-03-12 DIAGNOSIS — M961 Postlaminectomy syndrome, not elsewhere classified: Secondary | ICD-10-CM | POA: Diagnosis not present

## 2021-06-11 ENCOUNTER — Encounter: Payer: Self-pay | Admitting: Neurology

## 2021-07-10 ENCOUNTER — Encounter: Payer: BC Managed Care – PPO | Admitting: Neurology

## 2021-07-12 DIAGNOSIS — M5417 Radiculopathy, lumbosacral region: Secondary | ICD-10-CM | POA: Diagnosis not present

## 2021-07-12 DIAGNOSIS — Z9689 Presence of other specified functional implants: Secondary | ICD-10-CM | POA: Diagnosis not present

## 2021-07-12 DIAGNOSIS — M961 Postlaminectomy syndrome, not elsewhere classified: Secondary | ICD-10-CM | POA: Diagnosis not present

## 2021-07-12 DIAGNOSIS — G894 Chronic pain syndrome: Secondary | ICD-10-CM | POA: Diagnosis not present

## 2021-07-16 DIAGNOSIS — E039 Hypothyroidism, unspecified: Secondary | ICD-10-CM | POA: Diagnosis not present

## 2021-07-16 DIAGNOSIS — E782 Mixed hyperlipidemia: Secondary | ICD-10-CM | POA: Diagnosis not present

## 2021-07-16 DIAGNOSIS — Z0001 Encounter for general adult medical examination with abnormal findings: Secondary | ICD-10-CM | POA: Diagnosis not present

## 2021-07-16 DIAGNOSIS — F3289 Other specified depressive episodes: Secondary | ICD-10-CM | POA: Diagnosis not present

## 2021-07-16 DIAGNOSIS — E559 Vitamin D deficiency, unspecified: Secondary | ICD-10-CM | POA: Diagnosis not present

## 2021-07-16 DIAGNOSIS — G6189 Other inflammatory polyneuropathies: Secondary | ICD-10-CM | POA: Diagnosis not present

## 2021-07-16 DIAGNOSIS — D518 Other vitamin B12 deficiency anemias: Secondary | ICD-10-CM | POA: Diagnosis not present

## 2021-07-18 DIAGNOSIS — M48061 Spinal stenosis, lumbar region without neurogenic claudication: Secondary | ICD-10-CM | POA: Diagnosis not present

## 2021-07-18 DIAGNOSIS — M4316 Spondylolisthesis, lumbar region: Secondary | ICD-10-CM | POA: Diagnosis not present

## 2021-07-18 DIAGNOSIS — Z881 Allergy status to other antibiotic agents status: Secondary | ICD-10-CM | POA: Diagnosis not present

## 2021-07-18 DIAGNOSIS — Z9889 Other specified postprocedural states: Secondary | ICD-10-CM | POA: Diagnosis not present

## 2021-07-18 DIAGNOSIS — Z886 Allergy status to analgesic agent status: Secondary | ICD-10-CM | POA: Diagnosis not present

## 2021-07-18 DIAGNOSIS — M1990 Unspecified osteoarthritis, unspecified site: Secondary | ICD-10-CM | POA: Diagnosis not present

## 2021-07-18 DIAGNOSIS — Z885 Allergy status to narcotic agent status: Secondary | ICD-10-CM | POA: Diagnosis not present

## 2021-07-23 ENCOUNTER — Other Ambulatory Visit: Payer: Self-pay | Admitting: Family Medicine

## 2021-07-25 ENCOUNTER — Other Ambulatory Visit: Payer: Self-pay | Admitting: Family Medicine

## 2021-07-25 ENCOUNTER — Other Ambulatory Visit: Payer: Self-pay | Admitting: Obstetrics and Gynecology

## 2021-07-25 DIAGNOSIS — M4326 Fusion of spine, lumbar region: Secondary | ICD-10-CM

## 2021-07-25 DIAGNOSIS — Z1231 Encounter for screening mammogram for malignant neoplasm of breast: Secondary | ICD-10-CM

## 2021-07-30 ENCOUNTER — Ambulatory Visit
Admission: RE | Admit: 2021-07-30 | Discharge: 2021-07-30 | Disposition: A | Discharge status: Home / Self Care | Payer: BC Managed Care – PPO | Source: Ambulatory Visit | Attending: Family Medicine | Admitting: Family Medicine

## 2021-07-30 DIAGNOSIS — M4326 Fusion of spine, lumbar region: Secondary | ICD-10-CM

## 2021-07-30 DIAGNOSIS — M5126 Other intervertebral disc displacement, lumbar region: Secondary | ICD-10-CM | POA: Diagnosis not present

## 2021-07-30 DIAGNOSIS — M4696 Unspecified inflammatory spondylopathy, lumbar region: Secondary | ICD-10-CM | POA: Diagnosis not present

## 2021-07-30 DIAGNOSIS — M47817 Spondylosis without myelopathy or radiculopathy, lumbosacral region: Secondary | ICD-10-CM | POA: Diagnosis not present

## 2021-07-30 IMAGING — CT CT L SPINE W/O CM
1 of 6 series · 7 of 14 positions shown, 9 images · non-contrast
Comparison: Lumbar spine MRI [DATE], lumbar spine CT [DATE]

CLINICAL DATA: Assess lumbar fusion



[Series 2: l-spine 2.0 st · axial · 0.30mm/px · z∈[-272,-74]mm · 7 of 133 slices shown, 9 images]
[im 17/133  soft-tissue]
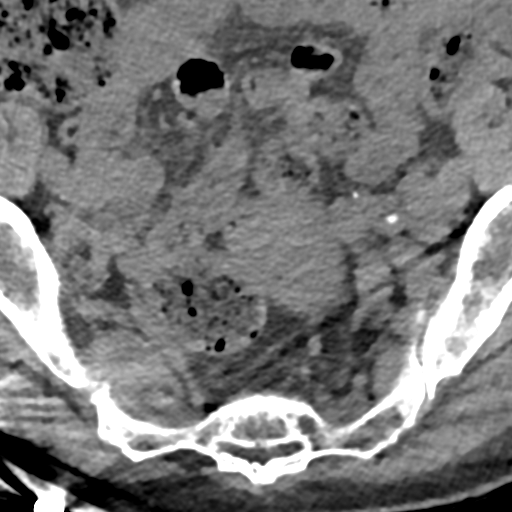
[im 17/133  bone]
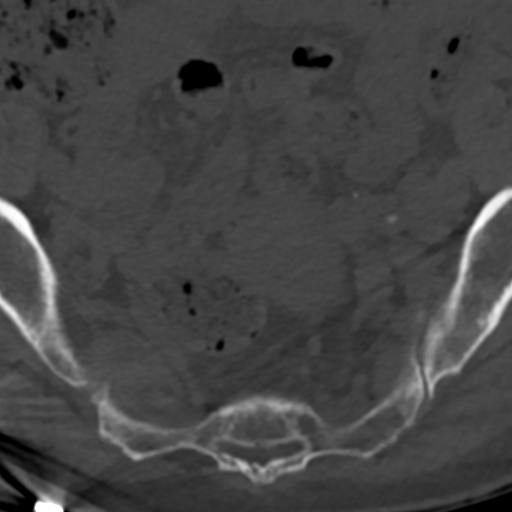
[im 34/133  bone]
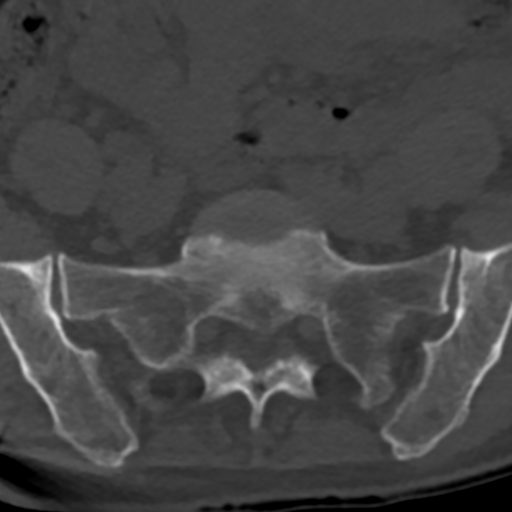
[im 50/133  bone]
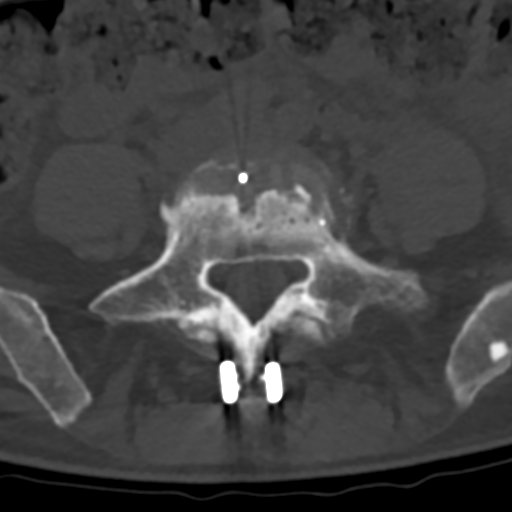
[im 67/133  bone]
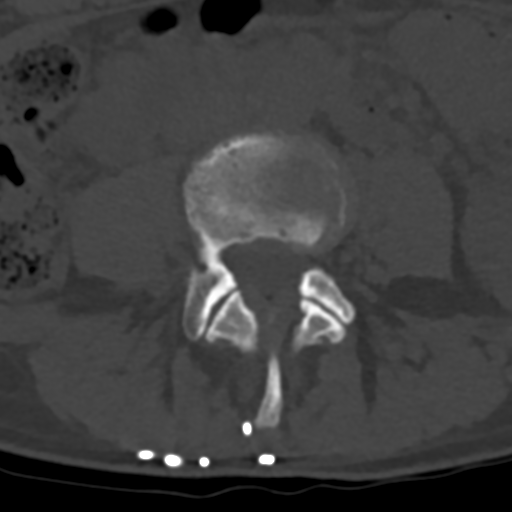
[im 83/133  soft-tissue]
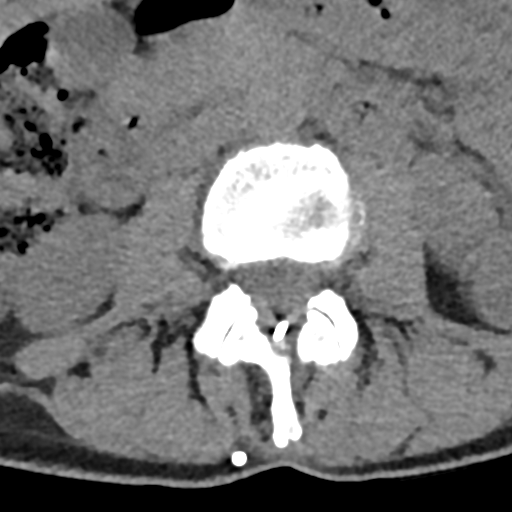
[im 83/133  bone]
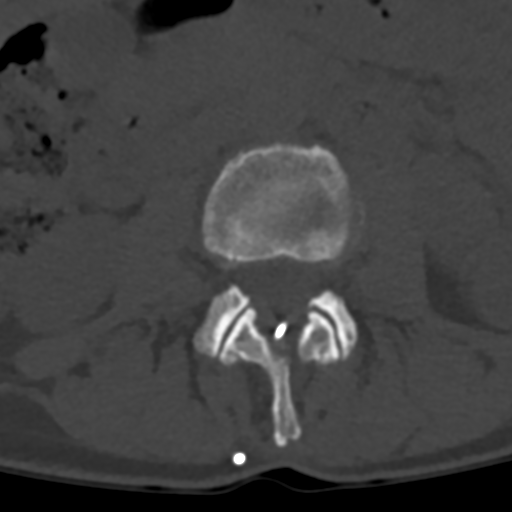
[im 100/133  bone]
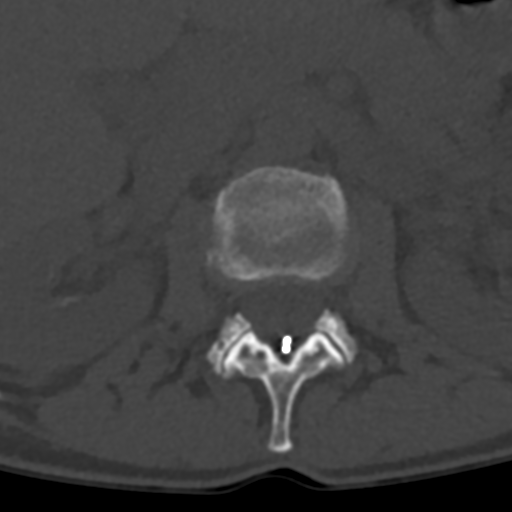
[im 116/133  bone]
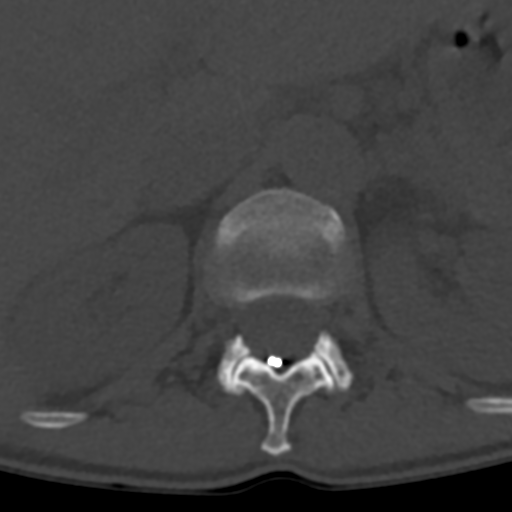

[7 of 14 positions shown; findings below may reference images not displayed]

FINDINGS: Segmentation: Standard; the lowest formed disc space is designated
L5-S1.

Alignment: Normal.

Vertebrae: Vertebral body heights are preserved. There is no
suspicious osseous lesion or evidence of acute injury. The patient
is status post spinous process fusion with interbody spacer
placement at L4-L5. There is no evidence of solid osseous fusion.

Paraspinal and other soft tissues: Stimulator device leads are seen
in the right lower paraspinal soft tissues with leads entering the
canal at the L2-L3 and L1-L2 levels. The superior aspect of the
leads is not imaged. The paraspinal soft tissues are otherwise
unremarkable.

Disc levels: The disc heights at the nonsurgical levels are overall
preserved. T12-L1: Mild facet arthropathy with no significant spinal
canal or neural foraminal stenosis

L1-L2: There is a minimal disc bulge and mild facet arthropathy
without significant spinal canal or neural foraminal stenosis.

L2-L3: There is a minimal disc bulge and mild bilateral facet
arthropathy without significant spinal canal or neural foraminal
stenosis.

L3-L4: There is a mild disc bulge, mild bilateral facet arthropathy,
and ligamentum flavum thickening without significant spinal canal or
neural foraminal stenosis

L4-L5: Postsurgical changes as above. There is moderate bilateral
facet arthropathy without significant spinal canal or neural
foraminal stenosis

L5-S1: Bilateral facet arthropathy without significant spinal canal
or neural foraminal stenosis.
IMPRESSION: 1. Status post interbody and spinous process fusion at L4-L5 without
evidence of solid osseous fusion.
2. Overall mild multilevel degenerative changes as above without
significant spinal canal or neural foraminal stenosis. Facet
arthropathy is most advanced at L4-L5.

## 2021-07-31 DIAGNOSIS — N951 Menopausal and female climacteric states: Secondary | ICD-10-CM | POA: Diagnosis not present

## 2021-07-31 DIAGNOSIS — E039 Hypothyroidism, unspecified: Secondary | ICD-10-CM | POA: Diagnosis not present

## 2021-07-31 DIAGNOSIS — Z77120 Contact with and (suspected) exposure to mold (toxic): Secondary | ICD-10-CM | POA: Diagnosis not present

## 2021-07-31 DIAGNOSIS — G629 Polyneuropathy, unspecified: Secondary | ICD-10-CM | POA: Diagnosis not present

## 2021-08-01 DIAGNOSIS — L578 Other skin changes due to chronic exposure to nonionizing radiation: Secondary | ICD-10-CM | POA: Diagnosis not present

## 2021-08-01 DIAGNOSIS — B078 Other viral warts: Secondary | ICD-10-CM | POA: Diagnosis not present

## 2021-08-01 DIAGNOSIS — D225 Melanocytic nevi of trunk: Secondary | ICD-10-CM | POA: Diagnosis not present

## 2021-08-01 DIAGNOSIS — D485 Neoplasm of uncertain behavior of skin: Secondary | ICD-10-CM | POA: Diagnosis not present

## 2021-08-01 DIAGNOSIS — L821 Other seborrheic keratosis: Secondary | ICD-10-CM | POA: Diagnosis not present

## 2021-08-09 DIAGNOSIS — M961 Postlaminectomy syndrome, not elsewhere classified: Secondary | ICD-10-CM | POA: Diagnosis not present

## 2021-08-09 DIAGNOSIS — Z9689 Presence of other specified functional implants: Secondary | ICD-10-CM | POA: Diagnosis not present

## 2021-08-09 DIAGNOSIS — G894 Chronic pain syndrome: Secondary | ICD-10-CM | POA: Diagnosis not present

## 2021-08-09 DIAGNOSIS — M5417 Radiculopathy, lumbosacral region: Secondary | ICD-10-CM | POA: Diagnosis not present

## 2021-08-14 DIAGNOSIS — Z01419 Encounter for gynecological examination (general) (routine) without abnormal findings: Secondary | ICD-10-CM | POA: Diagnosis not present

## 2021-08-14 DIAGNOSIS — Z681 Body mass index (BMI) 19 or less, adult: Secondary | ICD-10-CM | POA: Diagnosis not present

## 2021-08-16 DIAGNOSIS — C44511 Basal cell carcinoma of skin of breast: Secondary | ICD-10-CM | POA: Diagnosis not present

## 2021-09-03 ENCOUNTER — Ambulatory Visit: Payer: BC Managed Care – PPO

## 2021-09-06 DIAGNOSIS — G894 Chronic pain syndrome: Secondary | ICD-10-CM | POA: Diagnosis not present

## 2021-09-06 DIAGNOSIS — M961 Postlaminectomy syndrome, not elsewhere classified: Secondary | ICD-10-CM | POA: Diagnosis not present

## 2021-09-06 DIAGNOSIS — M5417 Radiculopathy, lumbosacral region: Secondary | ICD-10-CM | POA: Diagnosis not present

## 2021-09-06 DIAGNOSIS — Z9689 Presence of other specified functional implants: Secondary | ICD-10-CM | POA: Diagnosis not present

## 2021-09-11 DIAGNOSIS — H00021 Hordeolum internum right upper eyelid: Secondary | ICD-10-CM | POA: Diagnosis not present

## 2021-10-02 ENCOUNTER — Ambulatory Visit
Admission: RE | Admit: 2021-10-02 | Discharge: 2021-10-02 | Disposition: A | Discharge status: Home / Self Care | Payer: BC Managed Care – PPO | Source: Ambulatory Visit | Attending: Obstetrics and Gynecology | Admitting: Obstetrics and Gynecology

## 2021-10-02 DIAGNOSIS — Z1231 Encounter for screening mammogram for malignant neoplasm of breast: Secondary | ICD-10-CM

## 2021-10-18 DIAGNOSIS — H02825 Cysts of left lower eyelid: Secondary | ICD-10-CM | POA: Diagnosis not present

## 2021-10-31 DIAGNOSIS — M545 Low back pain, unspecified: Secondary | ICD-10-CM | POA: Diagnosis not present

## 2021-11-02 DIAGNOSIS — M545 Low back pain, unspecified: Secondary | ICD-10-CM | POA: Diagnosis not present

## 2021-11-06 DIAGNOSIS — Z9689 Presence of other specified functional implants: Secondary | ICD-10-CM | POA: Diagnosis not present

## 2021-11-06 DIAGNOSIS — Z5181 Encounter for therapeutic drug level monitoring: Secondary | ICD-10-CM | POA: Diagnosis not present

## 2021-11-06 DIAGNOSIS — Z79899 Other long term (current) drug therapy: Secondary | ICD-10-CM | POA: Diagnosis not present

## 2021-11-06 DIAGNOSIS — M5417 Radiculopathy, lumbosacral region: Secondary | ICD-10-CM | POA: Diagnosis not present

## 2021-11-06 DIAGNOSIS — G894 Chronic pain syndrome: Secondary | ICD-10-CM | POA: Diagnosis not present

## 2021-11-07 DIAGNOSIS — M545 Low back pain, unspecified: Secondary | ICD-10-CM | POA: Diagnosis not present

## 2021-11-13 DIAGNOSIS — M7918 Myalgia, other site: Secondary | ICD-10-CM | POA: Diagnosis not present

## 2021-11-13 DIAGNOSIS — M961 Postlaminectomy syndrome, not elsewhere classified: Secondary | ICD-10-CM | POA: Diagnosis not present

## 2021-11-13 DIAGNOSIS — G894 Chronic pain syndrome: Secondary | ICD-10-CM | POA: Diagnosis not present

## 2021-11-13 DIAGNOSIS — Z9689 Presence of other specified functional implants: Secondary | ICD-10-CM | POA: Diagnosis not present

## 2021-11-19 DIAGNOSIS — H02825 Cysts of left lower eyelid: Secondary | ICD-10-CM | POA: Diagnosis not present

## 2021-11-19 DIAGNOSIS — H00021 Hordeolum internum right upper eyelid: Secondary | ICD-10-CM | POA: Diagnosis not present

## 2021-12-31 DIAGNOSIS — M79605 Pain in left leg: Secondary | ICD-10-CM | POA: Diagnosis not present

## 2022-01-11 ENCOUNTER — Ambulatory Visit: Payer: BC Managed Care – PPO | Admitting: Physical Therapy

## 2022-01-22 DIAGNOSIS — M25512 Pain in left shoulder: Secondary | ICD-10-CM | POA: Diagnosis not present

## 2022-01-22 DIAGNOSIS — Z13820 Encounter for screening for osteoporosis: Secondary | ICD-10-CM | POA: Diagnosis not present

## 2022-01-31 DIAGNOSIS — M25512 Pain in left shoulder: Secondary | ICD-10-CM | POA: Diagnosis not present

## 2022-02-05 ENCOUNTER — Ambulatory Visit: Payer: BC Managed Care – PPO | Admitting: Physical Therapy

## 2022-02-19 DIAGNOSIS — M25512 Pain in left shoulder: Secondary | ICD-10-CM | POA: Diagnosis not present

## 2022-02-26 DIAGNOSIS — M5417 Radiculopathy, lumbosacral region: Secondary | ICD-10-CM | POA: Diagnosis not present

## 2022-02-26 DIAGNOSIS — Z9689 Presence of other specified functional implants: Secondary | ICD-10-CM | POA: Diagnosis not present

## 2022-02-26 DIAGNOSIS — M961 Postlaminectomy syndrome, not elsewhere classified: Secondary | ICD-10-CM | POA: Diagnosis not present

## 2022-02-26 DIAGNOSIS — G894 Chronic pain syndrome: Secondary | ICD-10-CM | POA: Diagnosis not present

## 2022-06-17 ENCOUNTER — Other Ambulatory Visit: Payer: Self-pay | Admitting: Obstetrics and Gynecology

## 2022-06-17 DIAGNOSIS — Z1231 Encounter for screening mammogram for malignant neoplasm of breast: Secondary | ICD-10-CM

## 2022-10-07 ENCOUNTER — Ambulatory Visit
Admission: RE | Admit: 2022-10-07 | Discharge: 2022-10-07 | Disposition: A | Discharge status: Home / Self Care | Payer: BC Managed Care – PPO | Source: Ambulatory Visit | Attending: Obstetrics and Gynecology | Admitting: Obstetrics and Gynecology

## 2022-10-07 DIAGNOSIS — Z1231 Encounter for screening mammogram for malignant neoplasm of breast: Secondary | ICD-10-CM

## 2023-08-21 ENCOUNTER — Other Ambulatory Visit: Payer: Self-pay | Admitting: Obstetrics and Gynecology

## 2023-08-21 DIAGNOSIS — Z1231 Encounter for screening mammogram for malignant neoplasm of breast: Secondary | ICD-10-CM

## 2023-10-23 ENCOUNTER — Ambulatory Visit
Admission: RE | Admit: 2023-10-23 | Discharge: 2023-10-23 | Disposition: A | Discharge status: Home / Self Care | Source: Ambulatory Visit | Attending: Obstetrics and Gynecology | Admitting: Obstetrics and Gynecology

## 2023-10-23 DIAGNOSIS — Z1231 Encounter for screening mammogram for malignant neoplasm of breast: Secondary | ICD-10-CM

## 2023-10-28 ENCOUNTER — Other Ambulatory Visit: Payer: Self-pay | Admitting: Obstetrics and Gynecology

## 2023-10-28 DIAGNOSIS — R928 Other abnormal and inconclusive findings on diagnostic imaging of breast: Secondary | ICD-10-CM

## 2023-11-03 ENCOUNTER — Other Ambulatory Visit: Payer: Self-pay | Admitting: Obstetrics and Gynecology

## 2023-11-03 ENCOUNTER — Ambulatory Visit
Admission: RE | Admit: 2023-11-03 | Discharge: 2023-11-03 | Disposition: A | Discharge status: Home / Self Care | Source: Ambulatory Visit | Attending: Obstetrics and Gynecology | Admitting: Obstetrics and Gynecology

## 2023-11-03 DIAGNOSIS — R928 Other abnormal and inconclusive findings on diagnostic imaging of breast: Secondary | ICD-10-CM

## 2023-11-04 ENCOUNTER — Other Ambulatory Visit: Payer: Self-pay | Admitting: Obstetrics and Gynecology

## 2023-11-04 DIAGNOSIS — R921 Mammographic calcification found on diagnostic imaging of breast: Secondary | ICD-10-CM

## 2023-11-06 ENCOUNTER — Ambulatory Visit
Admission: RE | Admit: 2023-11-06 | Discharge: 2023-11-06 | Disposition: A | Discharge status: Home / Self Care | Source: Ambulatory Visit | Attending: Obstetrics and Gynecology | Admitting: Obstetrics and Gynecology

## 2023-11-06 DIAGNOSIS — R921 Mammographic calcification found on diagnostic imaging of breast: Secondary | ICD-10-CM

## 2023-11-06 HISTORY — PX: BREAST BIOPSY: SHX20

## 2023-11-07 LAB — SURGICAL PATHOLOGY

## 2024-04-28 ENCOUNTER — Encounter: Payer: Self-pay | Admitting: Neurology

## 2024-04-29 ENCOUNTER — Other Ambulatory Visit: Payer: Self-pay

## 2024-04-29 DIAGNOSIS — R202 Paresthesia of skin: Secondary | ICD-10-CM

## 2024-06-22 ENCOUNTER — Encounter: Payer: Self-pay | Admitting: Neurology
